# Patient Record
Sex: Female | Born: 1950 | Race: White | Hispanic: No | Marital: Married | Smoking: Never smoker
Health system: Southern US, Community
[De-identification: ages and names within clinical notes are randomized; demographics above are authoritative.]

## PROBLEM LIST (undated history)

## (undated) DIAGNOSIS — Z860100 Personal history of colon polyps, unspecified: Secondary | ICD-10-CM

## (undated) DIAGNOSIS — K219 Gastro-esophageal reflux disease without esophagitis: Secondary | ICD-10-CM

## (undated) DIAGNOSIS — R519 Headache, unspecified: Secondary | ICD-10-CM

## (undated) DIAGNOSIS — Z87442 Personal history of urinary calculi: Secondary | ICD-10-CM

## (undated) DIAGNOSIS — Z803 Family history of malignant neoplasm of breast: Secondary | ICD-10-CM

## (undated) DIAGNOSIS — S8992XA Unspecified injury of left lower leg, initial encounter: Secondary | ICD-10-CM

## (undated) DIAGNOSIS — E039 Hypothyroidism, unspecified: Secondary | ICD-10-CM

## (undated) DIAGNOSIS — C801 Malignant (primary) neoplasm, unspecified: Secondary | ICD-10-CM

## (undated) DIAGNOSIS — M8580 Other specified disorders of bone density and structure, unspecified site: Secondary | ICD-10-CM

## (undated) DIAGNOSIS — D179 Benign lipomatous neoplasm, unspecified: Secondary | ICD-10-CM

## (undated) DIAGNOSIS — J189 Pneumonia, unspecified organism: Secondary | ICD-10-CM

## (undated) DIAGNOSIS — J45909 Unspecified asthma, uncomplicated: Secondary | ICD-10-CM

## (undated) DIAGNOSIS — N6019 Diffuse cystic mastopathy of unspecified breast: Secondary | ICD-10-CM

## (undated) DIAGNOSIS — N959 Unspecified menopausal and perimenopausal disorder: Secondary | ICD-10-CM

## (undated) DIAGNOSIS — R51 Headache: Secondary | ICD-10-CM

## (undated) DIAGNOSIS — E7800 Pure hypercholesterolemia, unspecified: Secondary | ICD-10-CM

## (undated) HISTORY — PX: WISDOM TOOTH EXTRACTION: SHX21

## (undated) HISTORY — DX: Hypothyroidism, unspecified: E03.9

## (undated) HISTORY — DX: Diffuse cystic mastopathy of unspecified breast: N60.19

## (undated) HISTORY — DX: Benign lipomatous neoplasm, unspecified: D17.9

## (undated) HISTORY — DX: Unspecified injury of left lower leg, initial encounter: S89.92XA

## (undated) HISTORY — DX: Pure hypercholesterolemia, unspecified: E78.00

## (undated) HISTORY — DX: Other specified disorders of bone density and structure, unspecified site: M85.80

## (undated) HISTORY — DX: Unspecified menopausal and perimenopausal disorder: N95.9

## (undated) HISTORY — DX: Personal history of colon polyps, unspecified: Z86.0100

## (undated) HISTORY — DX: Personal history of colonic polyps: Z86.010

## (undated) HISTORY — PX: TONSILLECTOMY AND ADENOIDECTOMY: SUR1326

## (undated) HISTORY — DX: Family history of malignant neoplasm of breast: Z80.3

## (undated) HISTORY — DX: Unspecified asthma, uncomplicated: J45.909

---

## 1987-09-07 DIAGNOSIS — E039 Hypothyroidism, unspecified: Secondary | ICD-10-CM

## 1987-09-07 HISTORY — DX: Hypothyroidism, unspecified: E03.9

## 2000-09-06 HISTORY — PX: OTHER SURGICAL HISTORY: SHX169

## 2004-04-03 ENCOUNTER — Encounter: Payer: Self-pay | Admitting: Pulmonary Disease

## 2006-01-12 ENCOUNTER — Encounter: Payer: Self-pay | Admitting: Pulmonary Disease

## 2008-02-12 ENCOUNTER — Encounter: Payer: Self-pay | Admitting: Pulmonary Disease

## 2008-03-04 ENCOUNTER — Encounter: Payer: Self-pay | Admitting: Pulmonary Disease

## 2009-02-25 ENCOUNTER — Encounter: Payer: Self-pay | Admitting: Pulmonary Disease

## 2009-11-05 ENCOUNTER — Ambulatory Visit: Payer: Self-pay | Admitting: Pulmonary Disease

## 2009-11-05 DIAGNOSIS — E039 Hypothyroidism, unspecified: Secondary | ICD-10-CM | POA: Insufficient documentation

## 2009-11-05 DIAGNOSIS — S99929A Unspecified injury of unspecified foot, initial encounter: Secondary | ICD-10-CM

## 2009-11-05 DIAGNOSIS — D179 Benign lipomatous neoplasm, unspecified: Secondary | ICD-10-CM

## 2009-11-05 DIAGNOSIS — D126 Benign neoplasm of colon, unspecified: Secondary | ICD-10-CM | POA: Insufficient documentation

## 2009-11-05 DIAGNOSIS — E7800 Pure hypercholesterolemia, unspecified: Secondary | ICD-10-CM

## 2009-11-05 DIAGNOSIS — S99919A Unspecified injury of unspecified ankle, initial encounter: Secondary | ICD-10-CM

## 2009-11-05 DIAGNOSIS — N959 Unspecified menopausal and perimenopausal disorder: Secondary | ICD-10-CM | POA: Insufficient documentation

## 2009-11-05 DIAGNOSIS — J309 Allergic rhinitis, unspecified: Secondary | ICD-10-CM | POA: Insufficient documentation

## 2009-11-05 DIAGNOSIS — N6019 Diffuse cystic mastopathy of unspecified breast: Secondary | ICD-10-CM

## 2009-11-05 DIAGNOSIS — S8990XA Unspecified injury of unspecified lower leg, initial encounter: Secondary | ICD-10-CM

## 2009-11-05 LAB — CONVERTED CEMR LAB
ALT: 21 units/L (ref 0–35)
AST: 22 units/L (ref 0–37)
Alkaline Phosphatase: 71 units/L (ref 39–117)
Basophils Absolute: 0 10*3/uL (ref 0.0–0.1)
Basophils Relative: 0.8 % (ref 0.0–3.0)
Bilirubin, Direct: 0.2 mg/dL (ref 0.0–0.3)
CO2: 29 meq/L (ref 19–32)
Chloride: 107 meq/L (ref 96–112)
Eosinophils Absolute: 0.1 10*3/uL (ref 0.0–0.7)
LDL Cholesterol: 74 mg/dL (ref 0–99)
MCHC: 34.8 g/dL (ref 30.0–36.0)
MCV: 96.3 fL (ref 78.0–100.0)
Monocytes Absolute: 0.3 10*3/uL (ref 0.1–1.0)
Neutrophils Relative %: 54.3 % (ref 43.0–77.0)
Platelets: 151 10*3/uL (ref 150.0–400.0)
Potassium: 4.6 meq/L (ref 3.5–5.1)
RDW: 10.9 % — ABNORMAL LOW (ref 11.5–14.6)
TSH: 0.19 microintl units/mL — ABNORMAL LOW (ref 0.35–5.50)
Total Bilirubin: 0.5 mg/dL (ref 0.3–1.2)
Total CHOL/HDL Ratio: 2

## 2009-11-20 LAB — CONVERTED CEMR LAB: Vit D, 25-Hydroxy: 19 ng/mL — ABNORMAL LOW (ref 30–89)

## 2010-05-04 ENCOUNTER — Ambulatory Visit: Payer: Self-pay | Admitting: Pulmonary Disease

## 2010-05-04 DIAGNOSIS — M8580 Other specified disorders of bone density and structure, unspecified site: Secondary | ICD-10-CM | POA: Insufficient documentation

## 2010-05-04 DIAGNOSIS — E559 Vitamin D deficiency, unspecified: Secondary | ICD-10-CM

## 2010-10-06 NOTE — Assessment & Plan Note (Signed)
Summary: 67m reck/klw   Primary Care [facility]:  Charlynne Cousins)  CC:  6 month ROV & review of mult medical problems....  History of Present Illness: 60 y/o WF, mother of Glennie Hawk, who has moved here from [place] to be closer to her daughter & grandchildren... she enjoys good general medical health & takes Simvastatin for Cholesterol, and Synthroid for Hypothyroidism... we will send for records from her Internist in [place]>>> Records from DrEhret reviewed>>> pertinent problems added to her prob list below & data scanned into the EMR record...    ~  May 04, 2010:  27mo ROV doing satis but she has gained 10# and notes coccyx pain on long rides to [place]... we discussed proper cushioning, warm soaks & analgesics for this coccydynia... reviewed data from DrEhret & f/u BMD in 2012... prev labs reviewed & TSH was oversuppressed on the Synthroid 112mcg/d dose> we will repeat and decr dose if nec...   Current Problems:   ALLERGIC RHINITIS (ICD-477.9) - she's had allergy testing in the past- prev on shots...  she notes that she is sensitive to smoke w/ SOB & was on an inhaler in the past (diagnosed w/ reactive airways disease)... she states her breathing & allergy symptoms really improved after her cockateel died!  HYPERCHOLESTEROLEMIA (ICD-272.0) - prev on Lip20 & switched to SIMVASTATIN 40mg /d per her insurance... last labs in [place] 7/10 were good on this med + low fat diet...  ~  FLP here on Simva40 3/11 showed TChol 144, TG 37, HDL 63, LDL 74  HYPOTHYROIDISM (ICD-244.9) - she relates hx enlarged thyroid in 1989 w/ RAI therapy... subsequently started on Synthroid and doing well on 112 micrograms/d x yrs now...  ~  labs 3/11 here on Synth112 showed TSH= 0.19 (0.35-5.50)  ~  labs 8/11 on Synth112 showed TSH= 0.16 and we decided to decr her to Synthroid 133mcg/d.  COLONIC POLYPS (ICD-211.3) - she gives hx of initial colonoscopy 5/07 in [place] showing 2 tiny polyps- hyperplastic on  path, told to repeat 5-7 yrs by her [place] physicians...  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 FIBROCYSTIC BREAST DISEASE (ICD-610.1) - she takes estrogen & progesterone meds from her GYN in [place]... she will be establishing w/ GYN here in [place] soon... last mammogram was 10/10 & told to f/u 8yr... there is a pos FamHx of breast cancer in her mother...  Hx of KNEE INJURY, LEFT (ICD-959.7) - occas left knee pain due to old injury... she uses OTC meds w/ relief & wears a brace Prn...  OSTEOPENIA (ICD-733.90) - she had BMDs in [place] in 2007 & 2008> encouraged to take OSCAL Bid + MVI...  ~  BMD 3/07 showed TScores -1.8 in Spine, and -0.1 in left Hip...  ~  BMD 6/09 showed TScores -1.2 in Spine, and -0.1 in Hip  VITAMIN D DEFICIENCY (ICD-268.9) - rec to start Vit D supplement  ~2000 u daily...  ~  labs here 3/11 showed Vit D level = 19... rec> start  ~2000 u daily.  Hx of LIPOMAS, MULTIPLE (ICD-214.9) - hx mult lipomas, several removed over the yrs... and treatment for ONYCHOMYCOSIS w/ Lamisil.   Preventive Screening-Counseling & Management  Alcohol-Tobacco     Smoking Status: never  Allergies: 1)  ! Bactrim  Comments:  Nurse/Medical Assistant: The patient's medications and allergies were reviewed with the patient and were updated in the Medication and Allergy Lists.  Past History:  Past Medical History: ALLERGIC RHINITIS (ICD-477.9)  HYPERCHOLESTEROLEMIA (ICD-272.0) HYPOTHYROIDISM (ICD-244.9) COLONIC POLYPS (ICD-211.3) POSTMENOPAUSAL SYNDROME (ICD-627.9) FIBROCYSTIC BREAST DISEASE (ICD-610.1) Hx of KNEE INJURY, LEFT (ICD-959.7) OSTEOPENIA (ICD-733.90) VITAMIN D DEFICIENCY (ICD-268.9) Hx of LIPOMAS, MULTIPLE (ICD-214.9)  Past Surgical History: S/P T & A at age 27 S/P wisdom teeth removed as teen S/P benign right breast biopsy in 2002 S/P removal of mult lipomas  Family History: Reviewed history from 11/05/2009  and no changes required. Father died age 75 w/ alzheimer's dis, HBP, [place] Mother dies age 64 w/ breast cancer (bilat tumors) No siblings...  Social History: Reviewed history from 11/05/2009 and no changes required. Married, husb= [place], 40 yrs... 1 Daughter, Glennie Hawk... 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    Vital Signs:  Patient profile:   60 year old female Height:      65 inches Weight:      160.19 pounds O2 Sat:      97 % on Room air Temp:     97.3 degrees F oral Pulse rate:   60 / minute BP sitting:   120 / 76  (right arm) Cuff size:   regular  Vitals Entered By: Randell Loop CMA (May 04, 2010 2:04 PM)  O2 Sat at Rest %:  97 O2 Flow:  Room air CC: 6 month ROV & review of mult medical problems... Is Patient Diabetic? No Pain Assessment Patient in pain? no      Comments no changes in meds today   Physical Exam  Additional Exam:  WD, WN, 60 y/o WF in NAD... GENERAL:  Alert & oriented; pleasant & cooperative... HEENT:  [place]/AT, EOM-wnl, PERRLA, Fundi-benign, EACs-clear, TMs-wnl, NOSE-clear, THROAT-clear & wnl. NECK:  Supple w/ full ROM; no JVD; normal carotid impulses w/o bruits; no thyromegaly or nodules palpated; no lymphadenopathy. CHEST:  Clear to P & A; without wheezes/ rales/ or rhonchi. HEART:  Regular Rhythm; without murmurs/ rubs/ or gallops. ABDOMEN:  Soft & nontender; normal bowel  sounds; no organomegaly or masses detected. Tender on palp over coccyx... EXT: without deformities or arthritic changes; no varicose veins/ venous insuffic/ or edema. NEURO:  CN's intact; motor testing normal; sensory testing normal; gait normal & balance OK. DERM:  she has several lipomas...    MISC. Report  Procedure date:  05/04/2010  Findings:      TSH (TSH)   FastTSH              [L]  0.16 uIU/mL                 0.35-5.50   Impression & Recommendations:  Problem # 1:  COCCYDYNIA>>> We discussed warm soaks, donut cushion, and analgesics (she does not want meds)... discussed the role of surg in this condition & she will watch the discomfort & call Prn...  Problem # 2:  HYPERCHOLESTEROLEMIA (ICD-272.0) Labs looked good on the Simva40... Her updated medication list for this problem includes:    Simvastatin 40 Mg Tabs (Simvastatin) .Marland Kitchen... Take 1 by mouth once daily  Problem # 3:  HYPOTHYROIDISM (ICD-244.9) TSH oversuppressed on the 112 dose.Marland KitchenMarland Kitchen  therefore we will decr to 100 micrograms/d... Her updated medication list for this problem includes:    Synthroid 100 Mcg Tabs (Levothyroxine sodium) .Marland Kitchen... Take 1 tab by mouth once daily...  Orders: TLB-TSH (Thyroid Stimulating Hormone) (84443-TSH)  Problem # 4:  COLONIC POLYPS (ICD-211.3) Last colon was 2007 & follow up due ? 20012-14 range...  Problem # 5:  FIBROCYSTIC BREAST DISEASE (ICD-610.1) She needs GYN specialist & f/u mammogram due 10/11 (pos FamHx breast ca in mother- died in 55's)...  Problem # 6:  OSTEOPENIA (ICD-733.90) We discussed f/u BMD in 2012 & she will contine her hormones per GYN, Calcium, MVI, Vit D supplements...  Complete Medication List: 1)  Simvastatin 40 Mg Tabs (Simvastatin) .... Take 1 by mouth once daily 2)  Synthroid 100 Mcg Tabs (Levothyroxine sodium) .... Take 1 tab by mouth once daily.Marland KitchenMarland Kitchen 3)  Ortho-est 0.625 0.75 Mg Tabs (Estropipate) .... Take 1 by mouth once daily 4)  Medroxyprogesterone  Acetate 5 Mg Tabs (Medroxyprogesterone acetate) .... Take 1 by mouth once daily 5)  Oscal 500/200 D-3 500-200 Mg-unit Tabs (Calcium-vitamin d) .... Take 1 tab by mouth two times a day... 6)  Vitamin D 2000 Unit Tabs (Cholecalciferol) .... Take one tablet by mouth once daily  Patient Instructions: 1)  Today we updated your med list- see below.... 2)  Today we did your follow up TSH level to check your Thyroid function & medication... please call the "phone tree" in a few days for your lab results... if your TSH is oversuppressed we will recommend a decrease in your dose.Marland KitchenMarland Kitchen 3)  We discussed diet + exercise program... 4)  Call for any problems.Marland KitchenMarland Kitchen 5)  Please schedule a follow-up appointment in 6 months. Prescriptions: SYNTHROID 100 MCG TABS (LEVOTHYROXINE SODIUM) take 1 tab by mouth once daily...  #90 x 4   Entered and Authorized by:   Michele Mcalpine MD   Signed by:   Michele Mcalpine MD on 05/04/2010   Method used:   Print then Give to Patient   RxID:   [id number]    Immunization History:  Influenza Immunization History:    Influenza:  historical (07/31/2008)

## 2010-10-06 NOTE — Procedures (Signed)
Summary: Roxanne Mins MD  Roxanne Mins MD   Imported By: Lester Montgomery 05/12/2010 11:01:49  _____________________________________________________________________  External Attachment:    Type:   Image     Comment:   External Document

## 2010-10-06 NOTE — Assessment & Plan Note (Signed)
Summary: NEW PT TO ESTABLISH/OK PER SN/APC   Primary Care Provider:  Charlynne Cousins)  CC:  New patient to establish care....  History of Present Illness: 60 y/o WF, mother of Glennie Hawk, who has moved here from [place] to be closer to her daughter & grand daughters... she enjoys good general medical health & takes Simvastatin for Cholesterol, and Synthroid for Hypothyroidism... we will send for records from her Internist in [place]>>>   Current Problems:   ALLERGIC RHINITIS (ICD-477.9) - she's had allergy testing in the past- prev on shots...  she notes that she is sensitive to smoke w/ SOB & was on an inhaler in the past... she states her breathing & allergy symptoms really improved after her cockateel died!  HYPERCHOLESTEROLEMIA (ICD-272.0) - prev on Lip20 & switched to SIMVASTATIN 40mg /d per her insurance... last labs in [place] 7/10 were good on this med + low fat diet...  ~  FLP here on Simva40 3/11 showed TChol =   HYPOTHYROIDISM (ICD-244.9) - she relates hx enlarged thyroid in 1989 w/ RAI therapy... subsequently started on Synthroid and doing well on 112 micrograms/d x yrs now...  ~  labs 3/11 here on Synth112 showed TSH=   COLONIC POLYPS (ICD-211.3) - she gives hx of initial colonoscopy 5/07 in [place] showing 2 tiny polyps- ? path, told to repeat 5-7 yrs.  POSTMENOPAUSAL SYNDROME (ICD-627.9) FIBROCYSTIC BREAST DISEASE (ICD-610.1) - she takes estrogen & progesterone meds from her GYN in [place]... she will be establishing w/ GYN here in [place] soon... last mammogram was 10/10 & told to f/u 14yr...  Hx of KNEE INJURY, LEFT (ICD-959.7) - occas left knee pain due to old injury... she uses OTC meds w/ relief & wears a brace Prn...  Hx of LIPOMAS, MULTIPLE (ICD-214.9) - hx mult lipomas, several removed over the yrs...   Current Medications (verified): 1)  Synthroid 112 Mcg Tabs (Levothyroxine Sodium) .... Take 1 By Mouth Once Daily 2)  Simvastatin 40 Mg Tabs (Simvastatin)  .... Take 1 By Mouth Once Daily 3)  Ortho-Est 0.625 0.75 Mg Tabs (Estropipate) .... Take 1 By Mouth Once Daily 4)  Medroxyprogesterone Acetate 5 Mg Tabs (Medroxyprogesterone Acetate) .... Take 1 By Mouth Once Daily  Allergies (verified): 1)  ! Bactrim  Past History:  Past Medical History:  ALLERGIC RHINITIS (ICD-477.9) HYPERCHOLESTEROLEMIA (ICD-272.0) HYPOTHYROIDISM (ICD-244.9) COLONIC POLYPS (ICD-211.3) POSTMENOPAUSAL SYNDROME (ICD-627.9) FIBROCYSTIC BREAST DISEASE (ICD-610.1) Hx of KNEE INJURY, LEFT (ICD-959.7) Hx of LIPOMAS, MULTIPLE (ICD-214.9)  Past Surgical History: S/P T & A at age 89 S/P wisdom teeth removed as teen S/P benign right breast biopsy in 2002 S/P removal of mult lipomas  Family History: Father died age 49 w/ alzheimer's dis, HBP, [place] Mother dies age 77 w/ breast cancer (bilat tumors) No siblings...  Social History: Married, husb= [place], 40 yrs... 1 Daughter, Glennie Hawk... 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[employer]  Review of Systems  The patient denies fever, chills, sweats, anorexia, fatigue, weakness, malaise, weight loss, sleep disorder, blurring, diplopia, eye irritation, eye discharge, vision loss, eye pain, photophobia, earache, ear discharge, tinnitus, decreased hearing, nasal congestion, nosebleeds, sore throat, hoarseness, chest pain, palpitations, syncope, dyspnea on exertion, orthopnea, PND, peripheral edema, cough, dyspnea at rest, excessive sputum, hemoptysis, wheezing, pleurisy, nausea, vomiting, diarrhea, constipation, change in bowel habits, abdominal pain, melena, hematochezia, jaundice, gas/bloating, indigestion/heartburn, dysphagia, odynophagia, dysuria, hematuria, urinary frequency, urinary hesitancy, nocturia, incontinence, back pain, joint pain, joint swelling, muscle cramps, muscle weakness, stiffness, arthritis, sciatica, restless  legs, leg pain at night, leg pain with exertion, rash,  itching, dryness, suspicious lesions, paralysis, paresthesias, seizures, tremors, vertigo, transient blindness, frequent falls, frequent headaches, difficulty walking, depression, anxiety, memory loss, confusion, cold intolerance, heat intolerance, polydipsia, polyphagia, polyuria, unusual weight change, abnormal bruising, bleeding, enlarged lymph nodes, urticaria, allergic rash, hay fever, and recurrent infections.    Vital Signs:  Patient profile:   60 year old female Height:      65 inches Weight:      149.50 pounds BMI:     24.97 O2 Sat:      99 % on Room air Temp:     98 degrees F oral Pulse rate:   82 / minute BP sitting:   110 / 70  (left arm) Cuff size:   regular  Vitals Entered By: Renold Genta RCP, LPN (November 05, 1608 11:54 AM)  O2 Flow:  Room air  Physical Exam  Additional Exam:  WD, WN, 60 y/o WF in NAD... GENERAL:  Alert & oriented; pleasant & cooperative... HEENT:  [place]/AT, EOM-wnl, PERRLA, Fundi-benign, EACs-clear, TMs-wnl, NOSE-clear, THROAT-clear & wnl. NECK:  Supple w/ full ROM; no JVD; normal carotid impulses w/o bruits; no thyromegaly or nodules palpated; no lymphadenopathy. CHEST:  Clear to P & A; without wheezes/ rales/ or rhonchi. HEART:  Regular Rhythm; without murmurs/ rubs/ or gallops. ABDOMEN:  Soft & nontender; normal bowel sounds; no organomegaly or masses detected. EXT: without deformities or arthritic changes; no varicose veins/ venous insuffic/ or edema. NEURO:  CN's intact; motor testing normal; sensory testing normal; gait normal & balance OK. DERM:  she has several lipomas...    MISC. Report  Procedure date:  11/05/2009  Findings:      Lipid Panel (LIPID)   Cholesterol               144 mg/dL                   9-604   Triglycerides             37.0 mg/dL                  5.4-098.1   HDL                       19.14 mg/dL                 >78.29   LDL Cholesterol           74 mg/dL                    5-62  BMP (METABOL)   Sodium                     142 mEq/L                   135-145   Potassium                 4.6 mEq/L                   3.5-5.1   Chloride                  107 mEq/L                   96-112   Carbon Dioxide            29 mEq/L  19-32   Glucose                   96 mg/dL                    16-10   BUN                       13 mg/dL                    9-60   Creatinine                1.0 mg/dL                   4.5-4.0   Calcium                   9.4 mg/dL                   9.8-11.9   GFR                       60.58 mL/min                >60  Hepatic/Liver Function Panel (HEPATIC)   Total Bilirubin           0.5 mg/dL                   1.4-7.8   Direct Bilirubin          0.2 mg/dL                   2.9-5.6   Alkaline Phosphatase      71 U/L                      39-117   AST                       22 U/L                      0-37   ALT                       21 U/L                      0-35   Total Protein             6.9 g/dL                    2.1-3.0   Albumin                   4.1 g/dL                    8.6-5.7  Comments:      CBC Platelet w/Diff (CBCD)   White Cell Count          4.6 K/uL                    4.5-10.5   Red Cell Count       [L]  3.84 Mil/uL                 3.87-5.11   Hemoglobin                12.8 g/dL  12.0-15.0   Hematocrit                36.9 %                      36.0-46.0   MCV                       96.3 fl                     78.0-100.0   Platelet Count            151.0 K/uL                  150.0-400.0   Neutrophil %              54.3 %                      43.0-77.0   Lymphocyte %              35.7 %                      12.0-46.0   Monocyte %                7.6 %                       3.0-12.0   Eosinophils%              1.6 %                       0.0-5.0   Basophils %               0.8 %                       0.0-3.0   TSH (TSH)   FastTSH              [L]  0.19 uIU/mL                 0.35-5.50   Impression & Recommendations:  Problem  # 1:  NEW PATIENT>>> We are sending request for old records to her Internist in [place]...  Problem # 2:  ALLERGIC RHINITIS (ICD-477.9) We discussed Claritin or similar in AM, Saline spray every 1-2 H, and consider nasal steroid Qhs (she will call if she needs this)...  Problem # 3:  HYPERCHOLESTEROLEMIA (ICD-272.0) Check FLP on Simva40-  her numbers look great!  continue the same. Her updated medication list for this problem includes:    Simvastatin 40 Mg Tabs (Simvastatin) .Marland Kitchen... Take 1 by mouth once daily  Orders: Prescription Created Electronically [id number]) TLB-Lipid Panel (80061-LIPID) TLB-BMP (Basic Metabolic Panel-BMET) (80048-METABOL) TLB-Hepatic/Liver Function Pnl (80076-HEPATIC) TLB-CBC Platelet - w/Differential (85025-CBCD) TLB-TSH (Thyroid Stimulating Hormone) (84443-TSH) T-Vitamin D (25-Hydroxy) (95284-13244)  Problem # 4:  COLONIC POLYPS (ICD-211.3) She notes hx 2 sm polyps on her initial colon 2007 & told to f/u in 5-45yrs...  Problem # 5:  POSTMENOPAUSAL SYNDROME (ICD-627.9) She will establish w/ a local GYN for refill of these meds & other options... Her updated medication list for this problem includes:    Ortho-est 0.625 0.75 Mg Tabs (Estropipate) .Marland Kitchen... Take 1 by mouth once daily  Problem # 6:  FIBROCYSTIC BREAST DISEASE (ICD-610.1) F/u mammogram due 10/11 she says...  Problem # 7:  HYPOTHYROIDISM (ICD-244.9) TSH is sl oversuppressed on this dose but she feels well, clinically euthyroid, and she notes that she felt sluggish w/ hair symptoms on the 100 micrograms dose in the past...  OK to continue 112. Her updated medication list for this problem includes:    Synthroid 112 Mcg Tabs (Levothyroxine sodium) .Marland Kitchen... Take 1 by mouth once daily  Problem # 8:  OTHER MEDICAL PROBLEMS AS NOTED>>>  Complete Medication List: 1)  Simvastatin 40 Mg Tabs (Simvastatin) .... Take 1 by mouth once daily 2)  Synthroid 112 Mcg Tabs (Levothyroxine sodium) .... Take 1 by mouth once  daily 3)  Ortho-est 0.625 0.75 Mg Tabs (Estropipate) .... Take 1 by mouth once daily 4)  Medroxyprogesterone Acetate 5 Mg Tabs (Medroxyprogesterone acetate) .... Take 1 by mouth once daily  Patient Instructions: 1)  Ludia, it was great meeting you.Marland KitchenMarland Kitchen  2)  We wrote new prscriptions for your Simvastatin & Synthroid.Marland KitchenMarland Kitchen  3)  Today we did your FASTING blood work... please call the "phone tree" in a few days for your lab results.Marland KitchenMarland Kitchen 4)  I recommend that you take an 81mg  Aspirin daily, a calcium supplement (eg- OsCal, Caltrate, Viactive), and a Women's Multivit (they have extra Vit D).Marland KitchenMarland Kitchen 5)  Call for any problems.Marland KitchenMarland Kitchen 6)  Please schedule a follow-up appointment in 6 months, sooner as needed.. Prescriptions: SYNTHROID 112 MCG TABS (LEVOTHYROXINE SODIUM) take 1 by mouth once daily  #30 x prn   Entered and Authorized by:   Michele Mcalpine MD   Signed by:   Michele Mcalpine MD on 11/05/2009   Method used:   Print then Give to Patient   RxID:   [id number] SIMVASTATIN 40 MG TABS (SIMVASTATIN) take 1 by mouth once daily  #30 x prn   Entered and Authorized by:   Michele Mcalpine MD   Signed by:   Michele Mcalpine MD on 11/05/2009   Method used:   Print then Give to Patient   RxID:   [id number]

## 2010-11-04 ENCOUNTER — Other Ambulatory Visit: Payer: PRIVATE HEALTH INSURANCE

## 2010-11-04 ENCOUNTER — Ambulatory Visit (INDEPENDENT_AMBULATORY_CARE_PROVIDER_SITE_OTHER): Payer: PRIVATE HEALTH INSURANCE | Admitting: Pulmonary Disease

## 2010-11-04 ENCOUNTER — Ambulatory Visit (INDEPENDENT_AMBULATORY_CARE_PROVIDER_SITE_OTHER)
Admission: RE | Admit: 2010-11-04 | Discharge: 2010-11-04 | Disposition: A | Discharge status: Home / Self Care | Payer: PRIVATE HEALTH INSURANCE | Source: Ambulatory Visit | Attending: Pulmonary Disease | Admitting: Pulmonary Disease

## 2010-11-04 ENCOUNTER — Encounter: Payer: Self-pay | Admitting: Pulmonary Disease

## 2010-11-04 ENCOUNTER — Other Ambulatory Visit: Payer: Self-pay | Admitting: Pulmonary Disease

## 2010-11-04 DIAGNOSIS — Z0000 Encounter for general adult medical examination without abnormal findings: Secondary | ICD-10-CM

## 2010-11-04 DIAGNOSIS — Z23 Encounter for immunization: Secondary | ICD-10-CM

## 2010-11-04 LAB — CBC WITH DIFFERENTIAL/PLATELET
Basophils Absolute: 0 10*3/uL (ref 0.0–0.1)
Hemoglobin: 13.3 g/dL (ref 12.0–15.0)
Lymphocytes Relative: 36.1 % (ref 12.0–46.0)
Monocytes Relative: 8.3 % (ref 3.0–12.0)
Neutro Abs: 2.5 10*3/uL (ref 1.4–7.7)
RBC: 3.99 Mil/uL (ref 3.87–5.11)
RDW: 12.3 % (ref 11.5–14.6)

## 2010-11-04 LAB — URINALYSIS, ROUTINE W REFLEX MICROSCOPIC
Bilirubin Urine: NEGATIVE
Ketones, ur: NEGATIVE
Leukocytes, UA: NEGATIVE
Total Protein, Urine: NEGATIVE
Urine Glucose: NEGATIVE
pH: 5.5 (ref 5.0–8.0)

## 2010-11-04 LAB — HEPATIC FUNCTION PANEL
Albumin: 4.3 g/dL (ref 3.5–5.2)
Alkaline Phosphatase: 61 U/L (ref 39–117)
Total Protein: 6.9 g/dL (ref 6.0–8.3)

## 2010-11-04 LAB — BASIC METABOLIC PANEL
BUN: 12 mg/dL (ref 6–23)
CO2: 28 mEq/L (ref 19–32)
Calcium: 9.6 mg/dL (ref 8.4–10.5)
Creatinine, Ser: 1 mg/dL (ref 0.4–1.2)
Glucose, Bld: 89 mg/dL (ref 70–99)

## 2010-11-04 LAB — TSH: TSH: 0.34 u[IU]/mL — ABNORMAL LOW (ref 0.35–5.50)

## 2010-11-04 LAB — LIPID PANEL
HDL: 45.5 mg/dL (ref >39.00)
VLDL: 8.6 mg/dL (ref 0.0–40.0)

## 2010-11-08 LAB — CONVERTED CEMR LAB: Vit D, 25-Hydroxy: 33 ng/mL (ref 30–89)

## 2010-11-17 NOTE — Assessment & Plan Note (Signed)
Summary: 6 month/apc   Primary Care Provider:  Charlynne Cousins)  CC:  6 month ROV & yearly CPX....  History of Present Illness: 60 y/o WF, mother of Victoria Pacheco, who has moved here from [place] to be closer to her daughter & grandchildren... she enjoys good general medical health & takes Simvastatin for Cholesterol, and Synthroid for Hypothyroidism...  records from her Internist in [place] DrEhret reviewed>>> pertinent problems added to her prob list below & data scanned into the EMR record...    ~  May 04, 2010:  72mo ROV doing satis but she has gained 10# and notes coccyx pain on long rides to [place]... we discussed proper cushioning, warm soaks & analgesics for this coccydynia... reviewed data from DrEhret & f/u BMD due in 2012... prev labs reviewed & TSH was oversuppressed on the Synthroid 13mcg/d dose> we will repeat (TSH=0.16 & dose decr to 140mcg/d).   ~  November 04, 2010:  72mo ROV & CPX- doing well overall, notes some constip on calcium supplements & we discussed strategies to deal w/ this, also notes some heel pain ?plantar fasciitis & we reviewed stretching exercises, hot soaks, heel pad...    She's been on Simva40 per insurance but she notes some aching 7 wants to switch back to the Lip20 now that generic is avail;  FLP on Simva40 looks good> TChol 134, TG 43, HDL 46, LDL 80;  we will write for the Lipitor 20mg  per request...    She's stable on the Synthroid dose w/ TSH= 0.34 & her wt stable at 161#... Vit D level is improved to 33 from 19 on the 2000 u daily & she will continue same... we will sched a follow up BMD.   Current Problems:   ALLERGIC RHINITIS (ICD-477.9) - she's had allergy testing in the past- prev on shots...  she notes that she is sensitive to smoke w/ SOB & was on an inhaler in the past (diagnosed w/ reactive airways disease)... she states her breathing & allergy symptoms really improved after her cockateel died!  HYPERCHOLESTEROLEMIA (ICD-272.0)  - prev on Lip20 & switched to SIMVASTATIN 40mg /d per her insurance... last labs in [place] 7/10 were good on this med + low fat diet...  ~  FLP here on Simva40 3/11 showed TChol 144, TG 37, HDL 63, LDL 74  HYPOTHYROIDISM (ICD-244.9) - she relates hx enlarged thyroid in 1989 w/ RAI therapy... subsequently started on Synthroid and doing well on 112 micrograms/d x yrs now...  ~  labs 3/11 here on Synth112 showed TSH= 0.19 (0.35-5.50)  ~  labs 8/11 on Synth112 showed TSH= 0.16 and we decided to decr her to Synthroid 154mcg/d.  COLONIC POLYPS (ICD-211.3) - she gives hx of initial colonoscopy 5/07 in [place] showing 2 tiny polyps- hyperplastic on path, told to repeat 5-7 yrs by her [place] physicians...  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 FIBROCYSTIC BREAST DISEASE (ICD-610.1) - she takes estrogen & progesterone meds from her GYN in [place]... she will be establishing w/ GYN here in [place] soon... last mammogram was 10/10 & told to f/u 20yr... there is a pos FamHx of breast cancer in her mother...  Hx of KNEE INJURY, LEFT (ICD-959.7) - occas left knee pain due to old injury... she uses OTC meds w/ relief & wears a brace Prn...  OSTEOPENIA (ICD-733.90) - she had BMDs in [place] in 2007 & 2008> encouraged to take OSCAL Bid + MVI...  ~  BMD  3/07 showed TScores -1.8 in Spine, and -0.1 in left Hip...  ~  BMD 6/09 showed TScores -1.2 in Spine, and -0.1 in Hip  VITAMIN D DEFICIENCY (ICD-268.9) - rec to start Vit D supplement  ~2000 u daily...  ~  labs here 3/11 showed Vit D level = 19... rec> start  ~2000 u daily.  Hx of LIPOMAS, MULTIPLE (ICD-214.9) - hx mult lipomas, several removed over the yrs... and treatment for ONYCHOMYCOSIS w/ Lamisil.   Preventive Screening-Counseling & Management  Alcohol-Tobacco     Smoking Status: never  Allergies: 1)  ! Bactrim  Comments:  Nurse/Medical Assistant: The patient's medications and allergies were  reviewed with the patient and were updated in the Medication and Allergy Lists.  Past History:  Past Medical History: ALLERGIC RHINITIS (ICD-477.9) HYPERCHOLESTEROLEMIA (ICD-272.0) HYPOTHYROIDISM (ICD-244.9) COLONIC POLYPS (ICD-211.3) POSTMENOPAUSAL SYNDROME (ICD-627.9) FIBROCYSTIC BREAST DISEASE (ICD-610.1) Hx of KNEE INJURY, LEFT (ICD-959.7) OSTEOPENIA (ICD-733.90) VITAMIN D DEFICIENCY (ICD-268.9) Hx of LIPOMAS, MULTIPLE (ICD-214.9)  Past Surgical History: S/P T & A at age 38 S/P wisdom teeth removed as teen S/P benign right breast biopsy in 2002 S/P removal of mult lipomas  Family History: Reviewed history from 05/04/2010 and no changes required. Father died age 61 w/ alzheimer's dis, HBP, [place] Mother dies age 85 w/ breast cancer (bilat tumors) No siblings...  Social History: Reviewed history from 05/04/2010 and no changes required. Married, husb= [place], 40 yrs... 1 Daughter, Victoria Pacheco... 2 grand daughters age 59-5 Never smoked Social alcohol Compliance officer for [employer]  Review of Systems      See HPI  The patient denies anorexia, fever, weight loss, weight gain, vision loss, decreased hearing, hoarseness, chest pain, syncope, dyspnea on exertion, peripheral edema, prolonged cough, headaches, hemoptysis, abdominal pain, melena, hematochezia, severe indigestion/heartburn, hematuria, incontinence, muscle weakness, suspicious skin lesions, transient blindness, difficulty walking, depression, unusual weight change, abnormal bleeding, enlarged lymph nodes, and angioedema.    Vital Signs:  Patient profile:   60 year old female Height:      65 inches Weight:      160.31 pounds BMI:     26.77 O2 Sat:      100 % on Room air Temp:     98.2 degrees F oral Pulse rate:   80 / minute BP sitting:   106 / 76  (right arm) Cuff size:   regular  Vitals Entered By: Randell Loop CMA (November 04, 2010 9:38 AM)  O2 Sat at Rest %:  100 O2 Flow:  Room air CC: 6 month ROV &  yearly CPX... Is Patient Diabetic? No Pain Assessment Patient in pain? yes      Onset of pain  right heel pain/painful when she walks Comments meds updated today with pt   Physical Exam  Additional Exam:  WD, WN, 60 y/o WF in NAD... GENERAL:  Alert & oriented; pleasant & cooperative... HEENT:  Felt/AT, EOM-wnl, PERRLA, Fundi-benign, EACs-clear, TMs-wnl, NOSE-clear, THROAT-clear & wnl. NECK:  Supple w/ full ROM; no JVD; normal carotid impulses w/o bruits; no thyromegaly or nodules palpated; no lymphadenopathy. CHEST:  Clear to P & A; without wheezes/ rales/ or rhonchi. HEART:  Regular Rhythm; without murmurs/ rubs/ or gallops. ABDOMEN:  Soft & nontender; normal bowel sounds; no organomegaly or masses detected. Tender on palp over coccyx... EXT: without deformities or arthritic changes; no varicose veins/ venous insuffic/ or edema. NEURO:  CN's intact; motor testing normal; sensory testing normal; gait normal & balance OK. DERM:  she has several lipomas.Marland KitchenMarland Kitchen  Impression & Recommendations:  Problem # 1:  PHYSICAL EXAMINATION (ICD-V70.0)  Orders: 12 Lead EKG (12 Lead EKG) T-2 View CXR (71020TC) T-Vitamin D (25-Hydroxy) (30160-10932) TLB-BMP (Basic Metabolic Panel-BMET) (80048-METABOL) TLB-Hepatic/Liver Function Pnl (80076-HEPATIC) TLB-CBC Platelet - w/Differential (85025-CBCD) TLB-Lipid Panel (80061-LIPID) TLB-TSH (Thyroid Stimulating Hormone) (84443-TSH) TLB-Udip w/ Micro (81001-URINE)  Problem # 2:  HYPERCHOLESTEROLEMIA (ICD-272.0) FLP looks good on Simva40, but we will switch back to Lip20 per her request (?aching)... Her updated medication list for this problem includes:    Lipitor 20 Mg Tabs (Atorvastatin calcium) .Marland Kitchen... Take 1 tab by mouth once daily...  Problem # 3:  HYPOTHYROIDISM (ICD-244.9) TSH is OK on the Levothy100... Her updated medication list for this problem includes:    Synthroid 100 Mcg Tabs (Levothyroxine sodium) .Marland Kitchen... Take 1 tab by mouth once  daily...  Problem # 4:  COLONIC POLYPS (ICD-211.3) She will be due for a follow up colonoscopy soon> last one in [place] 5/07- 2 tiny hyperpl polyps & told to recheck 5-13yrs...  Problem # 5:  OSTEOPENIA (ICD-733.90) we discussed her issues w/ calcium supplements & she will adjust etc... due for BMD & we will sched... continue 2000 u vit D daily,....  Complete Medication List: 1)  Lipitor 20 Mg Tabs (Atorvastatin calcium) .... Take 1 tab by mouth once daily.Marland KitchenMarland Kitchen 2)  Synthroid 100 Mcg Tabs (Levothyroxine sodium) .... Take 1 tab by mouth once daily.Marland KitchenMarland Kitchen 3)  Ortho-est 0.625 0.75 Mg Tabs (Estropipate) .... Take 1 by mouth once daily 4)  Medroxyprogesterone Acetate 2.5 Mg Tabs (Medroxyprogesterone acetate) .... Take one tablet by mouth once daily 5)  Oscal 500/200 D-3 500-200 Mg-unit Tabs (Calcium-vitamin d) .... Take 1 tab by mouth two times a day... 6)  Vitamin D 2000 Unit Tabs (Cholecalciferol) .... Take one tablet by mouth once daily 7)  Shingles Vaccine  .... Administer shingles vaccine pleade...  Other Orders: Tdap => 75yrs IM (35573) Admin 1st Vaccine (22025)  Patient Instructions: 1)  Today we updated your med list- see below.... 2)  We changed your Simvastatin to Lipitor 20mg /d & we refilled your meds for 2012... 3)  We will sched a Bone Density test at your convenience and we will call you w/ the results when avail.Marland KitchenMarland Kitchen 4)  Today we did your follow up CXR, EKG, & fasting blood work... please call the "phone tree" in a few days for your lab results.Marland KitchenMarland Kitchen 5)  Try different brands of CALCIUM (including the choc VIACTIVE) to see if they will not contrib to constipation.Marland KitchenMarland Kitchen 6)  You may aslo try the Metamucil/ fiberall/ Miralax OTC meds for your bowels... 7)  We gave you the combination Tetanus vaccine today called the TDAP (it is good for 65yrs)... 8)  We wrote a perscription for the SHINGLES vaccine which you can get at the [facility]/ [facility] shot clinics at your convenience... 9)  Call for any  questions... 10)  Please schedule a follow-up appointment in 1 year, sooner as needed... Prescriptions: SHINGLES VACCINE Administer Shingles vaccine pleade...  #1 x 0   Entered and Authorized by:   Michele Mcalpine MD   Signed by:   Michele Mcalpine MD on 11/04/2010   Method used:   Print then Give to Patient   RxID:   [id number] SYNTHROID 100 MCG TABS (LEVOTHYROXINE SODIUM) take 1 tab by mouth once daily...  #90 x 4   Entered and Authorized by:   Michele Mcalpine MD   Signed by:   Michele Mcalpine MD on 11/04/2010   Method  used:   Print then Give to Patient   RxID:   [id number] LIPITOR 20 MG TABS (ATORVASTATIN CALCIUM) take 1 tab by mouth once daily...  #90 x 4   Entered and Authorized by:   Michele Mcalpine MD   Signed by:   Michele Mcalpine MD on 11/04/2010   Method used:   Print then Give to Patient   RxID:   [id number]    Immunization History:  Influenza Immunization History:    Influenza:  historical (06/06/2010)  Immunizations Administered:  Tetanus Vaccine:    Vaccine Type: Tdap    Site: left deltoid    Mfr: GlaxoSmithKline    Dose: 0.5 ml    Route: IM    Given by: Randell Loop CMA    Exp. Date: 06/25/2012    Lot #: [id number]    VIS given: 07/24/08 version given November 04, 2010.

## 2011-05-12 ENCOUNTER — Other Ambulatory Visit: Payer: Self-pay | Admitting: Pulmonary Disease

## 2011-09-09 ENCOUNTER — Telehealth: Payer: Self-pay | Admitting: Pulmonary Disease

## 2011-09-09 DIAGNOSIS — M79671 Pain in right foot: Secondary | ICD-10-CM

## 2011-09-09 NOTE — Telephone Encounter (Signed)
Per SN---ok for referral for pt to see podiatry---in [place] if one is avaliable.  Called and spoke with pt and she is aware that we will try and set this up for her in [place] and we will call her with appt.

## 2011-09-09 NOTE — Telephone Encounter (Signed)
Spoke with pt. She is c/o pain on the top of her right foot x 2 wks. She states that her foot is not swollen or red, and denies any obvious injury. She states that she is fine sitting but foot feels stiff and painful when she tries to walk. Would like to know if SN wants to refer her to a specialist for this. Please advise, thanks!

## 2011-10-10 ENCOUNTER — Other Ambulatory Visit: Payer: Self-pay | Admitting: Pulmonary Disease

## 2011-11-05 ENCOUNTER — Other Ambulatory Visit (INDEPENDENT_AMBULATORY_CARE_PROVIDER_SITE_OTHER): Payer: PRIVATE HEALTH INSURANCE

## 2011-11-05 ENCOUNTER — Ambulatory Visit (INDEPENDENT_AMBULATORY_CARE_PROVIDER_SITE_OTHER): Payer: PRIVATE HEALTH INSURANCE | Admitting: Pulmonary Disease

## 2011-11-05 ENCOUNTER — Ambulatory Visit (INDEPENDENT_AMBULATORY_CARE_PROVIDER_SITE_OTHER)
Admission: RE | Admit: 2011-11-05 | Discharge: 2011-11-05 | Disposition: A | Discharge status: Home / Self Care | Payer: PRIVATE HEALTH INSURANCE | Source: Ambulatory Visit

## 2011-11-05 ENCOUNTER — Encounter: Payer: Self-pay | Admitting: Pulmonary Disease

## 2011-11-05 VITALS — BP 98/68 | HR 65 | Temp 98.4°F | Ht 64.0 in | Wt 173.0 lb

## 2011-11-05 DIAGNOSIS — Z0000 Encounter for general adult medical examination without abnormal findings: Secondary | ICD-10-CM

## 2011-11-05 DIAGNOSIS — M949 Disorder of cartilage, unspecified: Secondary | ICD-10-CM

## 2011-11-05 DIAGNOSIS — J309 Allergic rhinitis, unspecified: Secondary | ICD-10-CM

## 2011-11-05 DIAGNOSIS — E039 Hypothyroidism, unspecified: Secondary | ICD-10-CM

## 2011-11-05 DIAGNOSIS — E559 Vitamin D deficiency, unspecified: Secondary | ICD-10-CM

## 2011-11-05 DIAGNOSIS — E7800 Pure hypercholesterolemia, unspecified: Secondary | ICD-10-CM

## 2011-11-05 DIAGNOSIS — M899 Disorder of bone, unspecified: Secondary | ICD-10-CM

## 2011-11-05 DIAGNOSIS — D126 Benign neoplasm of colon, unspecified: Secondary | ICD-10-CM

## 2011-11-05 LAB — URINALYSIS
Bilirubin Urine: NEGATIVE
Hgb urine dipstick: NEGATIVE
Leukocytes, UA: NEGATIVE
Nitrite: NEGATIVE
Total Protein, Urine: NEGATIVE

## 2011-11-05 LAB — LIPID PANEL
Cholesterol: 143 mg/dL (ref 0–200)
Triglycerides: 48 mg/dL (ref 0.0–149.0)

## 2011-11-05 LAB — BASIC METABOLIC PANEL
BUN: 14 mg/dL (ref 6–23)
CO2: 29 mEq/L (ref 19–32)
Chloride: 108 mEq/L (ref 96–112)
Creatinine, Ser: 1 mg/dL (ref 0.4–1.2)

## 2011-11-05 LAB — CBC WITH DIFFERENTIAL/PLATELET
Basophils Relative: 0.6 % (ref 0.0–3.0)
Eosinophils Absolute: 0.1 10*3/uL (ref 0.0–0.7)
Eosinophils Relative: 2.2 % (ref 0.0–5.0)
Lymphocytes Relative: 34.1 % (ref 12.0–46.0)
MCHC: 33.3 g/dL (ref 30.0–36.0)
Neutrophils Relative %: 54.2 % (ref 43.0–77.0)
Platelets: 157 10*3/uL (ref 150.0–400.0)
RBC: 4.14 Mil/uL (ref 3.87–5.11)
WBC: 6 10*3/uL (ref 4.5–10.5)

## 2011-11-05 LAB — TSH: TSH: 0.47 u[IU]/mL (ref 0.35–5.50)

## 2011-11-05 LAB — HEPATIC FUNCTION PANEL
ALT: 13 U/L (ref 0–35)
AST: 16 U/L (ref 0–37)
Total Protein: 7.1 g/dL (ref 6.0–8.3)

## 2011-11-05 NOTE — Progress Notes (Signed)
Subjective:     Patient ID: Victoria Pacheco, female   DOB: Aug 02, 1951, 61 y.o.   MRN: [id number]  HPI 76 y/o WF, mother of Glennie Hawk, who has moved here from [place] to be closer to her daughter & grandchildren... she enjoys good general medical health & takes Simvastatin for Cholesterol, and Synthroid for Hypothyroidism...  records from her Internist in [place], DrEhret- reviewed>>> pertinent problems added to her prob list below & data scanned into the EMR record...   ~  May 04, 2010:  21mo ROV doing satis but she has gained 10# and notes coccyx pain on long rides to [place]... we discussed proper cushioning, warm soaks & analgesics for this coccydynia... reviewed data from DrEhret & f/u BMD due in 2012... prev labs reviewed & TSH was oversuppressed on the Synthroid 12mcg/d dose> we will repeat (TSH=0.16 & dose decr to 173mcg/d).  ~  November 04, 2010:  21mo ROV & CPX- doing well overall, notes some constip on calcium supplements & we discussed strategies to deal w/ this, also notes some heel pain ?plantar fasciitis & we reviewed stretching exercises, hot soaks, heel pad...    She's been on Simva40 per insurance but she notes some aching & wants to switch back to the Lip20 now that generic is avail;  FLP on Simva40 looks good> TChol 134, TG 43, HDL 46, LDL 80;  we will write for the Lipitor 20mg  per request...    She's stable on the Synthroid dose w/ TSH= 0.34 & her wt stable at 161#... Vit D level is improved to 33 from 19 on the 2000 u daily & she will continue same... we will sched a follow up BMD==> done 3/13 & looks good w/ mild osteopenia only (TScore -1.2 in left [facility], other sites normal)...  ~  November 05, 2011:  Yearly ROV & CPX>  Adaleena has had a good yr overall & is w/o new complaints or concerns;  She still notes some difficulty w/ Calcium supplements & asked to switch to [facility] & see if this is more palatable; being treated for tendonitis in right foot by Podiatrist & improved...  AR> states she had an allergic reaction to a dog at a pet store & she would like to know more about her allergy profile; we decided to perform RAST testing (see results below); rec to keep dog out of bedroom w/ air cleaner etc & use antihist as needed...    CHOL> On Lipitor20 now; FLP showed TChol 143, TG 48, HDL 51, LDL 82; REC> continue same...    Hypothy> On Synthroid100; TSH= 0.47 & she is clinically euthyroid; rec to continue same...    GI> hx hyperplastic polyps in [place] 5/07 w/ rec for f/u colonoscopy 36yrs- due 5/14...    GYN> she is seeing Dr. Arnette Schaumann in [place]; on estrogen & provera RX; note pos FamHx breast ca in her mother...    Osteopenia> on Calcium, MVI, VitD; BMD here 3/13 showed TScores -0.7 in Spine, and -1.2 in left FemNeck...    Onychomycosis> Podiatrist has her on Lamisil for nail fungus... Given TDAP 3/13 & Shingles Rx written... LABS 3/13:  FLP- at goals;  Chems- wnl;  CBC- wnl;  TSH=0.47;  UA- clear...   Problem List:    ALLERGIC RHINITIS (ICD-477.9) - she's had allergy testing in the past- prev on shots...  she notes that she is sensitive to smoke w/ SOB & was on an inhaler in the past (diagnosed w/ reactive airways disease)... she  states her breathing & allergy symptoms really improved after her cockateel died! ~  12/05/22: She noted allergic reaction on exposure to a dog in a pet store; we decided to proceed w/ RAST Panel:  IgE=20;  Molds were neg;  Borderline levels to Cat/Dog;  Borderline levels to Grasses/Trees;  Low level to Ragweed (this was her worst allergen)...  HYPERCHOLESTEROLEMIA (ICD-272.0) - prev on Lip20 & switched to SIMVASTATIN 40mg /d per her insurance... last labs in [place] 7/10 were good on this med + low fat diet... ~  FLP here on Simva40 3/11 showed TChol 144, TG 37, HDL 63, LDL 74  HYPOTHYROIDISM (ICD-244.9) - she relates hx enlarged thyroid in 1989 w/ RAI therapy... subsequently started on Synthroid and doing well on 112 micrograms/d x yrs now... ~  labs  3/11 here on Synth112 showed TSH= 0.19 (0.35-5.50) ~  labs 8/11 on Synth112 showed TSH= 0.16 and we decided to decr her to Synthroid 128mcg/d.  COLONIC POLYPS (ICD-211.3) - she gives hx of initial colonoscopy 5/07 in [place] showing 2 tiny polyps- hyperplastic on path, told to repeat 5-7 yrs by her [place] physicians...  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 FIBROCYSTIC BREAST DISEASE (ICD-610.1) - she takes estrogen & progesterone meds from her GYN in [place]... she will be establishing w/ GYN here in [place] soon... last mammogram was 10/10 & told to f/u 71yr... there is a pos FamHx of breast cancer in her mother...  Hx of KNEE INJURY, LEFT (ICD-959.7) - occas left knee pain due to old injury... she uses OTC meds w/ relief & wears a brace Prn...  OSTEOPENIA (ICD-733.90) - she had BMDs in [place] in 2007 & 2008> encouraged to take OSCAL Bid + MVI... ~  BMD 3/07 showed TScores -1.8 in Spine, and -0.1 in left Hip... ~  BMD 6/09 showed TScores -1.2 in Spine, and -0.1 in Hip  VITAMIN D DEFICIENCY (ICD-268.9) - rec to start Vit D supplement ~2000 u daily... ~  labs here 3/11 showed Vit D level = 19... rec> start ~2000 u daily.  Hx of LIPOMAS, MULTIPLE (ICD-214.9) - hx mult lipomas, several removed over the yrs... and treatment for ONYCHOMYCOSIS w/ Lamisil.   Past Surgical History  Procedure Date  . Tonsillectomy and adenoidectomy     at age 80  . Wisdom tooth extraction     as a teen  . Benign right breast biopsy 2002    Outpatient Encounter Prescriptions as of 11/05/2011  Medication Sig Dispense Refill  . estropipate (OGEN) 0.75 MG tablet Take 0.75 mg by mouth daily.      Marland Kitchen levothyroxine (SYNTHROID, LEVOTHROID) 100 MCG tablet TAKE 1 TAB BY MOUTH ONCE DAILY...  90 tablet  1  . medroxyPROGESTERone (PROVERA) 2.5 MG tablet Take 2.5 mg by mouth daily.      . meloxicam (MOBIC) 15 MG tablet Take 15 mg by mouth daily.      . simvastatin  (ZOCOR) 40 MG tablet TAKE 1 TABLET BY MOUTH EVERY DAY  30 tablet  8  . terbinafine (LAMISIL) 250 MG tablet Take 250 mg by mouth daily. Has not started taking yet      . DISCONTD: calcium-vitamin D (OSCAL WITH D) 500-200 MG-UNIT per tablet Take 1 tablet by mouth 2 (two) times daily.      Marland Kitchen DISCONTD: Cholecalciferol (VITAMIN D) 2000 UNITS CAPS Take 1 capsule by mouth daily.        Allergies  Allergen Reactions  .  Sulfamethoxazole W/Trimethoprim     REACTION: hives    Current Medications, Allergies, Past Medical History, Past Surgical History, Family History, and Social History were reviewed in [employer] record.    Review of Systems         See HPI - all other systems neg except as noted...  The patient denies anorexia, fever, weight loss, weight gain, vision loss, decreased hearing, hoarseness, chest pain, syncope, dyspnea on exertion, peripheral edema, prolonged cough, headaches, hemoptysis, abdominal pain, melena, hematochezia, severe indigestion/heartburn, hematuria, incontinence, muscle weakness, suspicious skin lesions, transient blindness, difficulty walking, depression, unusual weight change, abnormal bleeding, enlarged lymph nodes, and angioedema.     Objective:   Physical Exam     WD, WN, 61 y/o WF in NAD... GENERAL:  Alert & oriented; pleasant & cooperative... HEENT:  [place]/AT, EOM-wnl, PERRLA, Fundi-benign, EACs-clear, TMs-wnl, NOSE-clear, THROAT-clear & wnl. NECK:  Supple w/ full ROM; no JVD; normal carotid impulses w/o bruits; no thyromegaly or nodules palpated; no lymphadenopathy. CHEST:  Clear to P & A; without wheezes/ rales/ or rhonchi. HEART:  Regular Rhythm; without murmurs/ rubs/ or gallops. ABDOMEN:  Soft & nontender; normal bowel sounds; no organomegaly or masses detected. Tender on palp over coccyx... EXT: without deformities or arthritic changes; no varicose veins/ venous insuffic/ or edema. NEURO:  CN's intact; motor testing normal;  sensory testing normal; gait normal & balance OK. DERM:  she has several lipomas...  RADIOLOGY DATA:  Reviewed in the EPIC EMR & discussed w/ the patient...    >>CXR 2/12 showed normal heart size, clear lungs, NAD...    >>EKG 2/12 showed NSR, rate70, NSSTTWA, NAD...  LABORATORY DATA:  Reviewed in the EPIC EMR & discussed w/ the patient...    >>Labs 2/12 showed:  FLP- great on Simva40;  Chems- wnl;  CBC- wnl;  TSH=0.34 on Synthroid100    >>LABS 3/13:  FLP- at goals on Lip20;  Chems- wnl;  CBC- wnl;  TSH=0.47;  UA- clear...   Assessment:     AR>  She has mild AR and RAST panel done at her request w/ Borderline levels to Dog/Cat, Grasses/Trees, and low level reaction to Ragweed...  CHOL>  FLP at goals on Lip20;  Continue same...  Hypothyroid>  Stable on Synthroid 100...  Colon Polyps>  She will be due for f/u colon here 5/14...  Mild osteopenia & Vit D defic>  On Calcium, MVI, VitD; we discussed her calcium supplement intol & rec Viactiv...  Other medical issues as noted...     Plan:     Patient's Medications  New Prescriptions   No medications on file  Previous Medications   ATORVASTATIN (LIPITOR) 20 MG TABLET    Take 20 mg by mouth daily.   ESTROPIPATE (OGEN) 0.75 MG TABLET    Take 0.75 mg by mouth daily.   MEDROXYPROGESTERONE (PROVERA) 2.5 MG TABLET    Take 2.5 mg by mouth daily.   MELOXICAM (MOBIC) 15 MG TABLET    Take 15 mg by mouth daily.   TERBINAFINE (LAMISIL) 250 MG TABLET    Take 250 mg by mouth daily. Per Dr. Angelina Pih Medications   Modified Medication Previous Medication   LEVOTHYROXINE (SYNTHROID, LEVOTHROID) 100 MCG TABLET levothyroxine (SYNTHROID, LEVOTHROID) 100 MCG tablet      TAKE 1 TAB BY MOUTH ONCE DAILY...    TAKE 1 TAB BY MOUTH ONCE DAILY...  Discontinued Medications   CALCIUM-VITAMIN D (OSCAL WITH D) 500-200 MG-UNIT PER TABLET    Take 1 tablet  by mouth 2 (two) times daily.   CHOLECALCIFEROL (VITAMIN D) 2000 UNITS CAPS    Take 1 capsule by mouth  daily.   ESTROPIPATE (OGEN) 0.75 MG TABLET    Take 0.75 mg by mouth daily.   SIMVASTATIN (ZOCOR) 40 MG TABLET    TAKE 1 TABLET BY MOUTH EVERY DAY

## 2011-11-05 NOTE — Patient Instructions (Signed)
Today we updated your med list in our EPIC system...    We corrected our list to include the LIPITOR20, and the meds added by your Gyn & Podiatrist...  Try the VIACTIV calcium supplement (little choc square- ?yummy)...  Today we did your follow up fasting blood work...    Please call the PHONE TREE in a few days for your results...    Dial [id number] & when prompted enter your patient number followed by the # symbol...    Your patient number is:  [id number]#  We will arrange for a Bone density test to be done 7 we will call you w/ the results when avail...  Call for any questions...  Let's plan another physical in 1 years time.Marland KitchenMarland Kitchen

## 2011-11-06 ENCOUNTER — Other Ambulatory Visit: Payer: Self-pay | Admitting: Pulmonary Disease

## 2011-11-09 LAB — ALLERGY FULL PROFILE
Allergen, D pternoyssinus,d7: <0.1 kU/L (ref <0.35)
Bahia Grass: 0.22 kU/L (ref <0.35)
Box Elder IgE: 0.25 kU/L (ref <0.35)
Common Ragweed: 0.4 kU/L — ABNORMAL HIGH (ref <0.35)
Curvularia lunata: <0.1 kU/L (ref <0.35)
Dog Dander: 0.18 kU/L (ref <0.35)
Elm IgE: 0.16 kU/L (ref <0.35)
Fescue: 0.22 kU/L (ref <0.35)
G005 Rye, Perennial: 0.26 kU/L (ref <0.35)
G009 Red Top: 0.17 kU/L (ref <0.35)
Goldenrod: 0.16 kU/L (ref <0.35)
Helminthosporium halodes: <0.1 kU/L (ref <0.35)
House Dust Hollister: 0.29 kU/L (ref <0.35)
Oak: 0.25 kU/L (ref <0.35)
Sycamore Tree: 0.24 kU/L (ref <0.35)
Timothy Grass: 0.23 kU/L (ref <0.35)

## 2011-11-10 ENCOUNTER — Telehealth: Payer: Self-pay | Admitting: Pulmonary Disease

## 2011-11-10 NOTE — Telephone Encounter (Signed)
Spoke with pt. She states that she is returning a call to SN. She thinks that this is regarding labs. I advised that per append to labs, she has borderline allergies to cat/dog, grasses/trees, and low level to ragweed. Pt verbalized understanding, and wants to know if SN wanted her to add anything to her med regimen for allergies. I have mailed her copy of labs per her request. Please advise thanks! Allergies  Allergen Reactions  . Sulfamethoxazole W/Trimethoprim     REACTION: hives

## 2011-11-11 NOTE — Telephone Encounter (Signed)
SN has spoken with pt about her lab results.

## 2011-11-15 ENCOUNTER — Encounter: Payer: Self-pay | Admitting: Pulmonary Disease

## 2011-12-31 ENCOUNTER — Other Ambulatory Visit: Payer: Self-pay | Admitting: Pulmonary Disease

## 2012-05-14 ENCOUNTER — Other Ambulatory Visit: Payer: Self-pay | Admitting: Pulmonary Disease

## 2012-07-10 ENCOUNTER — Other Ambulatory Visit: Payer: Self-pay | Admitting: Pulmonary Disease

## 2012-09-26 ENCOUNTER — Other Ambulatory Visit: Payer: Self-pay | Admitting: Pulmonary Disease

## 2012-11-14 ENCOUNTER — Encounter: Payer: Self-pay | Admitting: Pulmonary Disease

## 2012-11-14 ENCOUNTER — Ambulatory Visit (INDEPENDENT_AMBULATORY_CARE_PROVIDER_SITE_OTHER)
Admission: RE | Admit: 2012-11-14 | Discharge: 2012-11-14 | Disposition: A | Discharge status: Home / Self Care | Payer: PRIVATE HEALTH INSURANCE | Source: Ambulatory Visit | Attending: Pulmonary Disease | Admitting: Pulmonary Disease

## 2012-11-14 ENCOUNTER — Other Ambulatory Visit (INDEPENDENT_AMBULATORY_CARE_PROVIDER_SITE_OTHER): Payer: PRIVATE HEALTH INSURANCE

## 2012-11-14 ENCOUNTER — Ambulatory Visit (INDEPENDENT_AMBULATORY_CARE_PROVIDER_SITE_OTHER): Payer: PRIVATE HEALTH INSURANCE | Admitting: Pulmonary Disease

## 2012-11-14 VITALS — BP 108/62 | HR 76 | Temp 98.4°F | Ht 64.0 in | Wt 183.0 lb

## 2012-11-14 DIAGNOSIS — Z0000 Encounter for general adult medical examination without abnormal findings: Secondary | ICD-10-CM

## 2012-11-14 DIAGNOSIS — E039 Hypothyroidism, unspecified: Secondary | ICD-10-CM

## 2012-11-14 DIAGNOSIS — M899 Disorder of bone, unspecified: Secondary | ICD-10-CM

## 2012-11-14 DIAGNOSIS — J309 Allergic rhinitis, unspecified: Secondary | ICD-10-CM

## 2012-11-14 DIAGNOSIS — E7800 Pure hypercholesterolemia, unspecified: Secondary | ICD-10-CM

## 2012-11-14 DIAGNOSIS — N6019 Diffuse cystic mastopathy of unspecified breast: Secondary | ICD-10-CM

## 2012-11-14 DIAGNOSIS — E559 Vitamin D deficiency, unspecified: Secondary | ICD-10-CM

## 2012-11-14 DIAGNOSIS — M949 Disorder of cartilage, unspecified: Secondary | ICD-10-CM

## 2012-11-14 DIAGNOSIS — D126 Benign neoplasm of colon, unspecified: Secondary | ICD-10-CM

## 2012-11-14 LAB — HEPATIC FUNCTION PANEL: Total Bilirubin: 0.7 mg/dL (ref 0.3–1.2)

## 2012-11-14 LAB — CBC WITH DIFFERENTIAL/PLATELET
Basophils Relative: 0.5 % (ref 0.0–3.0)
Eosinophils Relative: 1.6 % (ref 0.0–5.0)
HCT: 37.8 % (ref 36.0–46.0)
Lymphs Abs: 1.9 10*3/uL (ref 0.7–4.0)
MCV: 93.3 fl (ref 78.0–100.0)
Monocytes Absolute: 0.4 10*3/uL (ref 0.1–1.0)
Monocytes Relative: 8.3 % (ref 3.0–12.0)
RBC: 4.05 Mil/uL (ref 3.87–5.11)
WBC: 5.4 10*3/uL (ref 4.5–10.5)

## 2012-11-14 LAB — LIPID PANEL
Cholesterol: 135 mg/dL (ref 0–200)
HDL: 46.9 mg/dL (ref >39.00)
LDL Cholesterol: 81 mg/dL (ref 0–99)
Triglycerides: 38 mg/dL (ref 0.0–149.0)
VLDL: 7.6 mg/dL (ref 0.0–40.0)

## 2012-11-14 LAB — BASIC METABOLIC PANEL
BUN: 12 mg/dL (ref 6–23)
Calcium: 9.6 mg/dL (ref 8.4–10.5)
Creatinine, Ser: 1 mg/dL (ref 0.4–1.2)
GFR: 59.95 mL/min — ABNORMAL LOW (ref >60.00)

## 2012-11-14 LAB — URINALYSIS
Ketones, ur: NEGATIVE
Leukocytes, UA: NEGATIVE
Specific Gravity, Urine: 1.01 (ref 1.000–1.030)
Urine Glucose: NEGATIVE
pH: 6 (ref 5.0–8.0)

## 2012-11-14 MED ORDER — ATORVASTATIN CALCIUM 20 MG PO TABS: 20.0000 mg | ORAL_TABLET | Freq: Every day | ORAL | Status: DC

## 2012-11-14 NOTE — Progress Notes (Signed)
Subjective:     Patient ID: Victoria Pacheco, female   DOB: 1951/01/19, 62 y.o.   MRN: [id number]  HPI 21 y/o WF, mother of Victoria Pacheco, who has moved here from [place] to be closer to her daughter & grandchildren... she enjoys good general medical health & takes Simvastatin for Cholesterol, and Synthroid for Hypothyroidism...  records from her Internist in [place], DrEhret- reviewed>>> pertinent problems added to her prob list below & data scanned into the EMR record...   ~  November 04, 2010:  54mo ROV & CPX- doing well overall, notes some constip on calcium supplements & we discussed strategies to deal w/ this, also notes some heel pain ?plantar fasciitis & we reviewed stretching exercises, hot soaks, heel pad...    She's been on Simva40 per insurance but she notes some aching & wants to switch back to the Lip20 now that generic is avail;  FLP on Simva40 looks good> TChol 134, TG 43, HDL 46, LDL 80;  we will write for the Lipitor 20mg  per request...    She's stable on the Synthroid dose w/ TSH= 0.34 & her wt stable at 161#... Vit D level is improved to 33 from 19 on the 2000 u daily & she will continue same... we will sched a follow up BMD==> done 3/13 & looks good w/ mild osteopenia only (TScore -1.2 in left [facility], other sites normal)...  ~  November 05, 2011:  Yearly ROV & CPX>  [name] has had a good yr overall & is w/o new complaints or concerns;  She still notes some difficulty w/ Calcium supplements & asked to switch to [facility] & see if this is more palatable; being treated for tendonitis in right foot by Podiatrist & improved...    AR> states she had an allergic reaction to a dog at a pet store & she would like to know more about her allergy profile; we decided to perform RAST testing (see results below); rec to keep dog out of bedroom w/ air cleaner etc & use antihist as needed...    CHOL> On Lipitor20 now; FLP showed TChol 143, TG 48, HDL 51, LDL 82; REC> continue same...    Hypothy> On  Synthroid100; TSH= 0.47 & she is clinically euthyroid; rec to continue same...    GI> hx hyperplastic polyps in [place] 5/07 w/ rec for f/u colonoscopy 30yrs- due 5/14...    GYN> she is seeing Dr. Arnette Schaumann in [place]; on estrogen & provera RX; note pos FamHx breast ca in her mother...    Osteopenia> on Calcium, MVI, VitD; BMD here 3/13 showed TScores -0.7 in Spine, and -1.2 in left FemNeck...    Onychomycosis> Podiatrist has her on Lamisil for nail fungus... Given TDAP 3/13 & Shingles Rx written... LABS 3/13:  FLP- at goals;  Chems- wnl;  CBC- wnl;  TSH=0.47;  UA- clear...  ~  November 14, 2012:  Yearly ROV & CPX> [name] has been working w/ a Risk analyst in [place] x63yr w/ unresolved ?tendonitis (XRay was neg)- given Pred (sl better), then Mobic (no help), & tried inserts; we discussed refer to DrHewitt, Ortho for their review;  We reviewed the following medical problems during today's office visit >>     AR> RAST testing 2013 showed IgE=20, Molds were neg, Borderline levels to Cat/Dog, Borderline levels to Grasses/Trees, Low level to Ragweed (this was her worst allergen); They still haven't gotten a dog!...    CHOL> On Lipitor20; FLP 3/14 shows TChol 135, TG 38, HDL 47,  LDL 81; REC> continue same but needs more exercise (wt up 10# this yr)...    Hypothy> On Synthroid100; TSH= 1.63 & she is clinically euthyroid; rec to continue same...    GI> hx hyperplastic polyps in [place] 5/07 w/ rec for f/u colonoscopy 84yrs- due 5/14...    GYN> she is seeing Dr. Arnette Schaumann in [place]; on estrogen & provera rx; note pos FamHx breast ca in her mother (mammograms 1/14 w/ cyst).    DJD, foot pain> as above- working w/ podiatrist but right foot pain not better, we will refer to Ortho for further eval...    Osteopenia> on Calcium, MVI, VitD1000; Vit D level = 40; BMD here 2022-11-21 showed TScores -0.7 in Spine, and -1.2 in left FemNeck.    Onychomycosis> Podiatrist had her on Lamisil for nail fungus... We reviewed prob list, meds, xrays and labs>  see below for updates >> Rx written for Shingles vaccine per request... CXR 3/14 showed normal heart size, clear lungs, NAD.Marland KitchenMarland Kitchen EKG 3/14 showed NSR, rate68, NSSTTWA... LABS 3/14:  FLP- at goals on Lip20;  Chems- wnl;  CBC- wnl;  TSH=1.63;  VitD=40;  UA- clear...          Problem List:    ALLERGIC RHINITIS (ICD-477.9) - she's had allergy testing in the past- prev on shots...  she notes that she is sensitive to smoke w/ SOB & was on an inhaler in the past (diagnosed w/ reactive airways disease)... she states her breathing & allergy symptoms really improved after her cockateel died! ~  November 21, 2022: She noted allergic reaction on exposure to a dog in a pet store; we decided to proceed w/ RAST Panel:  IgE=20;  Molds were neg;  Borderline levels to Cat/Dog;  Borderline levels to Grasses/Trees;  Low level to Ragweed (this was her worst allergen)...  HYPERCHOLESTEROLEMIA (ICD-272.0) - prev on Lip20 & switched to SIMVASTATIN 40mg /d per her insurance... last labs in [place] 7/10 were good on this med + low fat diet... ~  FLP here on Simva40 3/11 showed TChol 144, TG 37, HDL 63, LDL 74 ~  11/21/22: on Lipitor20 now; FLP showed TChol 143, TG 48, HDL 51, LDL 82; REC> continue same ~  3/14: on Lipitor20; FLP 3/14 shows TChol 135, TG 38, HDL 47, LDL 81; REC> continue same but needs more exercise (wt up 10# this yr).  HYPOTHYROIDISM (ICD-244.9) - she relates hx enlarged thyroid in 1989 w/ RAI therapy... subsequently started on Synthroid and doing well on 112 micrograms/d x yrs now... ~  labs 3/11 here on Synth112 showed TSH= 0.19 (0.35-5.50) ~  labs 8/11 on Synth112 showed TSH= 0.16 and we decided to decr her to Synthroid 155mcg/d. ~  11/21/22: on Synthroid100; TSH= 0.47 & she is clinically euthyroid; rec to continue same. ~  3/14: on Synthroid100; TSH= 1.63 & she is clinically euthyroid; rec to continue same  COLONIC POLYPS (ICD-211.3) - she gives hx of initial colonoscopy 5/07 in [place] showing 2 tiny polyps- hyperplastic  on path, told to repeat 5-7 yrs by her [place] physicians...  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 FIBROCYSTIC BREAST DISEASE (ICD-610.1) - she takes estrogen & progesterone meds from her GYN in [place]... she will be establishing w/ GYN here in [place] soon... last mammogram was 10/10 & told to f/u 36yr... there is a pos FamHx of breast cancer in her mother...  Hx of KNEE INJURY, LEFT (ICD-959.7) - occas left knee pain due to old  injury... she uses OTC meds w/ relief & wears a brace Prn...  OSTEOPENIA (ICD-733.90) - she had BMDs in [place] in 2007 & 2008> encouraged to take OSCAL Bid + MVI... ~  BMD 3/07 showed TScores -1.8 in Spine, and -0.1 in left Hip... ~  BMD 6/09 showed TScores -1.2 in Spine, and -0.1 in Hip ~  3/13: on Calcium, MVI, VitD; BMD here 3/13 showed TScores -0.7 in Spine, and -1.2 in left FemNeck.Marland Kitchen  VITAMIN D DEFICIENCY (ICD-268.9) - rec to start Vit D supplement ~2000 u daily... ~  labs here 3/11 showed Vit D level = 19... rec> start ~2000 u daily. ~  3/14:  Vit D level = 40... Continue daily OTC supplement...  Hx of LIPOMAS, MULTIPLE (ICD-214.9) - hx mult lipomas, several removed over the yrs... and treatment for ONYCHOMYCOSIS w/ Lamisil.   Past Surgical History  Procedure Laterality Date  . Tonsillectomy and adenoidectomy      at age 31  . Wisdom tooth extraction      as a teen  . Benign right breast biopsy  2002    Outpatient Encounter Prescriptions as of 11/14/2012  Medication Sig Dispense Refill  . atorvastatin (LIPITOR) 20 MG tablet TAKE 1 TABLET BY MOUTH EVERY DAY  90 tablet  0  . estropipate (OGEN) 0.75 MG tablet Take 0.75 mg by mouth daily.      Marland Kitchen levothyroxine (SYNTHROID, LEVOTHROID) 100 MCG tablet TAKE 1 TAB BY MOUTH ONCE DAILY...  90 tablet  1  . medroxyPROGESTERone (PROVERA) 2.5 MG tablet Take 2.5 mg by mouth daily.      . [DISCONTINUED] meloxicam (MOBIC) 15 MG tablet Take 15 mg by mouth daily.       . [DISCONTINUED] simvastatin (ZOCOR) 40 MG tablet TAKE 1 TABLET BY MOUTH EVERY DAY  30 tablet  8  . [DISCONTINUED] terbinafine (LAMISIL) 250 MG tablet Take 250 mg by mouth daily. Per Dr. Sherilyn Cooter       No facility-administered encounter medications on file as of 11/14/2012.    Allergies  Allergen Reactions  . Sulfamethoxazole W-Trimethoprim     REACTION: hives    Current Medications, Allergies, Past Medical History, Past Surgical History, Family History, and Social History were reviewed in [employer] record.    Review of Systems         See HPI - all other systems neg except as noted...  The patient denies anorexia, fever, weight loss, weight gain, vision loss, decreased hearing, hoarseness, chest pain, syncope, dyspnea on exertion, peripheral edema, prolonged cough, headaches, hemoptysis, abdominal pain, melena, hematochezia, severe indigestion/heartburn, hematuria, incontinence, muscle weakness, suspicious skin lesions, transient blindness, difficulty walking, depression, unusual weight change, abnormal bleeding, enlarged lymph nodes, and angioedema.     Objective:   Physical Exam     WD, WN, 62 y/o WF in NAD... GENERAL:  Alert & oriented; pleasant & cooperative... HEENT:  [place]/AT, EOM-wnl, PERRLA, Fundi-benign, EACs-clear, TMs-wnl, NOSE-clear, THROAT-clear & wnl. NECK:  Supple w/ full ROM; no JVD; normal carotid impulses w/o bruits; no thyromegaly or nodules palpated; no lymphadenopathy. CHEST:  Clear to P & A; without wheezes/ rales/ or rhonchi. HEART:  Regular Rhythm; without murmurs/ rubs/ or gallops. ABDOMEN:  Soft & nontender; normal bowel sounds; no organomegaly or masses detected. Tender on palp over coccyx... EXT: without deformities or arthritic changes; no varicose veins/ venous insuffic/ or edema. NEURO:  CN's intact; motor testing normal; sensory testing normal; gait normal & balance OK. DERM:  she has several lipomas.Marland KitchenMarland Kitchen  RADIOLOGY DATA:   Reviewed in the EPIC EMR & discussed w/ the patient...  LABORATORY DATA:  Reviewed in the EPIC EMR & discussed w/ the patient...   Assessment:      AR>  She has mild AR and RAST panel done at her request w/ Borderline levels to Dog/Cat, Grasses/Trees, and low level reaction to Ragweed...  CHOL>  FLP at goals on Lip20;  Continue same...  Hypothyroid>  Stable on Synthroid 100...  Colon Polyps>  She will be due for f/u colon here 5/14...  Mild osteopenia & Vit D defic>  On Calcium, MVI, VitD; we discussed her calcium supplement intol & rec Viactiv...  Other medical issues as noted...     Plan:     Patient's Medications  New Prescriptions   No medications on file  Previous Medications   ESTROPIPATE (OGEN) 0.75 MG TABLET    Take 0.75 mg by mouth daily.   MEDROXYPROGESTERONE (PROVERA) 2.5 MG TABLET    Take 2.5 mg by mouth daily.  Modified Medications   Modified Medication Previous Medication   ATORVASTATIN (LIPITOR) 20 MG TABLET atorvastatin (LIPITOR) 20 MG tablet      Take 1 tablet (20 mg total) by mouth daily.    TAKE 1 TABLET BY MOUTH EVERY DAY   LEVOTHYROXINE (SYNTHROID, LEVOTHROID) 100 MCG TABLET levothyroxine (SYNTHROID, LEVOTHROID) 100 MCG tablet      TAKE 1 TAB BY MOUTH ONCE DAILY...    TAKE 1 TAB BY MOUTH ONCE DAILY...  Discontinued Medications   MELOXICAM (MOBIC) 15 MG TABLET    Take 15 mg by mouth daily.   SIMVASTATIN (ZOCOR) 40 MG TABLET    TAKE 1 TABLET BY MOUTH EVERY DAY   TERBINAFINE (LAMISIL) 250 MG TABLET    Take 250 mg by mouth daily. Per Dr. Sherilyn Cooter

## 2012-11-14 NOTE — Patient Instructions (Addendum)
Today we updated your med list in our EPIC system...    Continue your current medications the same...  Today we did your follow up CXR, EKG, & FASTING blood work...    We will contact you w/ the results when avail...  We will arrange for an Orthopedic consult regarding your right foot pain...  We wrote for the shingles vaccine as well...  Call for any questions...  Let's continue our yearly f/u physicals, but call any time as needed for problems.Marland KitchenMarland Kitchen

## 2012-11-15 LAB — VITAMIN D 25 HYDROXY (VIT D DEFICIENCY, FRACTURES): Vit D, 25-Hydroxy: 40 ng/mL (ref 30–89)

## 2012-11-29 ENCOUNTER — Other Ambulatory Visit: Payer: Self-pay | Admitting: Pulmonary Disease

## 2013-03-07 ENCOUNTER — Other Ambulatory Visit: Payer: Self-pay | Admitting: Pulmonary Disease

## 2013-06-15 ENCOUNTER — Telehealth: Payer: Self-pay | Admitting: Pulmonary Disease

## 2013-06-15 DIAGNOSIS — D126 Benign neoplasm of colon, unspecified: Secondary | ICD-10-CM

## 2013-06-15 NOTE — Telephone Encounter (Signed)
Per SN last ov note with this pt---she was due for her colon in 01/2013.  Order has been placed for her to be set up for the colonoscopy.  Pt is aware that the order has been placed and we will contact her with the appt.  Pt is requesting that this be done on 11/11 since this is a vacation day for her.

## 2013-06-18 ENCOUNTER — Encounter: Payer: Self-pay | Admitting: Gastroenterology

## 2013-07-02 ENCOUNTER — Ambulatory Visit (AMBULATORY_SURGERY_CENTER): Payer: Self-pay | Admitting: *Deleted

## 2013-07-02 VITALS — Ht 65.0 in | Wt 187.0 lb

## 2013-07-02 DIAGNOSIS — Z860100 Personal history of colon polyps, unspecified: Secondary | ICD-10-CM

## 2013-07-02 MED ORDER — MOVIPREP 100 G PO SOLR: ORAL | Status: DC

## 2013-07-02 NOTE — Progress Notes (Signed)
Patient denies any allergies to eggs or soy. Patient denies any problems with anesthesia.  

## 2013-07-10 ENCOUNTER — Encounter: Payer: Self-pay | Admitting: Gastroenterology

## 2013-07-16 ENCOUNTER — Encounter: Payer: Self-pay | Admitting: Gastroenterology

## 2013-07-16 ENCOUNTER — Ambulatory Visit (AMBULATORY_SURGERY_CENTER): Payer: PRIVATE HEALTH INSURANCE | Admitting: Gastroenterology

## 2013-07-16 VITALS — BP 112/71 | HR 62 | Temp 99.7°F | Resp 22 | Ht 65.0 in | Wt 187.0 lb

## 2013-07-16 DIAGNOSIS — D126 Benign neoplasm of colon, unspecified: Secondary | ICD-10-CM

## 2013-07-16 DIAGNOSIS — Z86010 Personal history of colonic polyps: Secondary | ICD-10-CM

## 2013-07-16 MED ORDER — SODIUM CHLORIDE 0.9 % IV SOLN: 500.0000 mL | INTRAVENOUS | Status: DC

## 2013-07-16 NOTE — Progress Notes (Signed)
Report to pacu RN, vss, bbs=clear 

## 2013-07-16 NOTE — Op Note (Signed)
[facility] [address] [place], [address]   COLONOSCOPY PROCEDURE REPORT  PATIENT: Victoria Pacheco, Victoria Pacheco  MR#: [id number] BIRTHDATE: [birth year] , 62  yrs. old GENDER: Female ENDOSCOPIST: Mardella Layman, MD, [facility] REFERRED XB:JYNWG Kriste Basque, M.D. PROCEDURE DATE:  07/16/2013 PROCEDURE:   Colonoscopy with biopsy First Screening Colonoscopy - Avg.  risk and is 50 yrs.  old or older - No.          Polyps Removed Today? Yes. ASA CLASS:   Class II INDICATIONS:Patient's personal history of adenomatous colon polyps.  MEDICATIONS: propofol (Diprivan) 350mg  IV  DESCRIPTION OF PROCEDURE:   After the risks benefits and alternatives of the procedure were thoroughly explained, informed consent was obtained.  A digital rectal exam revealed no abnormalities of the rectum.   The     endoscope was introduced through the anus and advanced to the cecum, which was identified by both the appendix and ileocecal valve. No adverse events experienced.   Limited by a tortuous and redundant colon.   The quality of the prep was excellent, using MoviPrep  The instrument was then slowly withdrawn as the colon was fully examined.      COLON FINDINGS: A small smooth flat polyp was found in the rectum. A biopsy was performed using cold forceps.   A normal appearing cecum, ileocecal valve, and appendiceal orifice were identified. The ascending, hepatic flexure, transverse, splenic flexure, descending, sigmoid colon and rectum appeared unremarkable.Very long and redundant colon !!!!!!  No polyps or cancers were seen. Retroflexed views revealed no abnormalities. The time to cecum=9 minutes 17 seconds.  Withdrawal time=7 minutes 41 seconds.  The scope was withdrawn and the procedure completed. COMPLICATIONS: There were no complications.  ENDOSCOPIC IMPRESSION: 1.   Small flat polyp was found in the rectum; biopsy was performed using cold forceps 2.   Normal colon..very long and redundant and  tortuous colon notedn....  RECOMMENDATIONS: 1.  Repeat colonoscopy in 5 years if polyp adenomatous; otherwise 10 years 2.  Await biopsy results 3.  High fiber diet 4.  Metamucil or benefiber   eSigned:  Mardella Layman, MD, [facility] 07/16/2013 11:10 AM   cc:

## 2013-07-16 NOTE — Progress Notes (Signed)
Patient did not experience any of the following events: a burn prior to discharge; a fall within the facility; wrong site/side/patient/procedure/implant event; or a hospital transfer or hospital admission upon discharge from the facility. (G8907) Patient did not have preoperative order for IV antibiotic SSI prophylaxis. (G8918)  

## 2013-07-16 NOTE — Progress Notes (Signed)
Called into room after procedure over.  Verified pt with tech and CRNA.  Pathology information given per D Georgina Pillion

## 2013-07-16 NOTE — Patient Instructions (Signed)
Colon polyp x 1 removed today. Handouts given on polyps and high fiber diet. Resume current medications. May use metamucil or benefiber as directed. Call us with any questions or concerns. Thank you!!  YOU HAD AN ENDOSCOPIC PROCEDURE TODAY AT THE  ENDOSCOPY CENTER: Refer to the procedure report that was given to you for any specific questions about what was found during the examination.  If the procedure report does not answer your questions, please call your gastroenterologist to clarify.  If you requested that your care partner not be given the details of your procedure findings, then the procedure report has been included in a sealed envelope for you to review at your convenience later.  YOU SHOULD EXPECT: Some feelings of bloating in the abdomen. Passage of more gas than usual.  Walking can help get rid of the air that was put into your GI tract during the procedure and reduce the bloating. If you had a lower endoscopy (such as a colonoscopy or flexible sigmoidoscopy) you may notice spotting of blood in your stool or on the toilet paper. If you underwent a bowel prep for your procedure, then you may not have a normal bowel movement for a few days.  DIET: Your first meal following the procedure should be a light meal and then it is ok to progress to your normal diet.  A half-sandwich or bowl of soup is an example of a good first meal.  Heavy or fried foods are harder to digest and may make you feel nauseous or bloated.  Likewise meals heavy in dairy and vegetables can cause extra gas to form and this can also increase the bloating.  Drink plenty of fluids but you should avoid alcoholic beverages for 24 hours.  ACTIVITY: Your care partner should take you home directly after the procedure.  You should plan to take it easy, moving slowly for the rest of the day.  You can resume normal activity the day after the procedure however you should NOT DRIVE or use heavy machinery for 24 hours (because of the  sedation medicines used during the test).    SYMPTOMS TO REPORT IMMEDIATELY: A gastroenterologist can be reached at any hour.  During normal business hours, 8:30 AM to 5:00 PM Monday through Friday, call [phone].  After hours and on weekends, please call the GI answering service at [phone] who will take a message and have the physician on call contact you.   Following lower endoscopy (colonoscopy or flexible sigmoidoscopy):  Excessive amounts of blood in the stool  Significant tenderness or worsening of abdominal pains  Swelling of the abdomen that is new, acute  Fever of 100F or higher  Following upper endoscopy (EGD)  Vomiting of blood or coffee ground material  New chest pain or pain under the shoulder blades  Painful or persistently difficult swallowing  New shortness of breath  Fever of 100F or higher  Black, tarry-looking stools  FOLLOW UP: If any biopsies were taken you will be contacted by phone or by letter within the next 1-3 weeks.  Call your gastroenterologist if you have not heard about the biopsies in 3 weeks.  Our staff will call the home number listed on your records the next business day following your procedure to check on you and address any questions or concerns that you may have at that time regarding the information given to you following your procedure. This is a courtesy call and so if there is no answer at the home  number and we have not heard from you through the emergency physician on call, we will assume that you have returned to your regular daily activities without incident.  SIGNATURES/CONFIDENTIALITY: You and/or your care partner have signed paperwork which will be entered into your electronic medical record.  These signatures attest to the fact that that the information above on your After Visit Summary has been reviewed and is understood.  Full responsibility of the confidentiality of this discharge information lies with you and/or your  care-partner.

## 2013-07-17 ENCOUNTER — Telehealth: Payer: Self-pay | Admitting: *Deleted

## 2013-07-17 NOTE — Telephone Encounter (Signed)
Left message on number given in admitting yesterday to return call if problems or questions. ewm

## 2013-07-20 ENCOUNTER — Encounter: Payer: Self-pay | Admitting: Gastroenterology

## 2013-11-16 ENCOUNTER — Ambulatory Visit: Payer: PRIVATE HEALTH INSURANCE | Admitting: Pulmonary Disease

## 2013-11-20 ENCOUNTER — Other Ambulatory Visit (INDEPENDENT_AMBULATORY_CARE_PROVIDER_SITE_OTHER): Payer: PRIVATE HEALTH INSURANCE

## 2013-11-20 ENCOUNTER — Ambulatory Visit (INDEPENDENT_AMBULATORY_CARE_PROVIDER_SITE_OTHER): Payer: PRIVATE HEALTH INSURANCE | Admitting: Pulmonary Disease

## 2013-11-20 ENCOUNTER — Encounter: Payer: Self-pay | Admitting: Pulmonary Disease

## 2013-11-20 VITALS — BP 126/78 | HR 73 | Temp 98.5°F | Ht 64.0 in | Wt 184.2 lb

## 2013-11-20 DIAGNOSIS — D126 Benign neoplasm of colon, unspecified: Secondary | ICD-10-CM

## 2013-11-20 DIAGNOSIS — M899 Disorder of bone, unspecified: Secondary | ICD-10-CM

## 2013-11-20 DIAGNOSIS — Z0000 Encounter for general adult medical examination without abnormal findings: Secondary | ICD-10-CM

## 2013-11-20 DIAGNOSIS — E039 Hypothyroidism, unspecified: Secondary | ICD-10-CM

## 2013-11-20 DIAGNOSIS — E7800 Pure hypercholesterolemia, unspecified: Secondary | ICD-10-CM

## 2013-11-20 DIAGNOSIS — J309 Allergic rhinitis, unspecified: Secondary | ICD-10-CM

## 2013-11-20 DIAGNOSIS — N6019 Diffuse cystic mastopathy of unspecified breast: Secondary | ICD-10-CM

## 2013-11-20 DIAGNOSIS — D179 Benign lipomatous neoplasm, unspecified: Secondary | ICD-10-CM

## 2013-11-20 DIAGNOSIS — M949 Disorder of cartilage, unspecified: Secondary | ICD-10-CM

## 2013-11-20 DIAGNOSIS — E559 Vitamin D deficiency, unspecified: Secondary | ICD-10-CM

## 2013-11-20 LAB — BASIC METABOLIC PANEL
BUN: 10 mg/dL (ref 6–23)
CALCIUM: 9.6 mg/dL (ref 8.4–10.5)
CO2: 29 mEq/L (ref 19–32)
Chloride: 101 mEq/L (ref 96–112)
Creatinine, Ser: 1 mg/dL (ref 0.4–1.2)
GFR: 63.4 mL/min (ref >60.00)
GLUCOSE: 100 mg/dL — AB (ref 70–99)
Potassium: 4.3 mEq/L (ref 3.5–5.1)
Sodium: 138 mEq/L (ref 135–145)

## 2013-11-20 LAB — CBC WITH DIFFERENTIAL/PLATELET
BASOS PCT: 0.5 % (ref 0.0–3.0)
Basophils Absolute: 0 10*3/uL (ref 0.0–0.1)
EOS PCT: 1.5 % (ref 0.0–5.0)
Eosinophils Absolute: 0.1 10*3/uL (ref 0.0–0.7)
HEMATOCRIT: 39.3 % (ref 36.0–46.0)
HEMOGLOBIN: 13 g/dL (ref 12.0–15.0)
LYMPHS ABS: 2.3 10*3/uL (ref 0.7–4.0)
Lymphocytes Relative: 61.9 % — ABNORMAL HIGH (ref 12.0–46.0)
MCHC: 33 g/dL (ref 30.0–36.0)
MCV: 93.5 fl (ref 78.0–100.0)
MONOS PCT: 9.3 % (ref 3.0–12.0)
Monocytes Absolute: 0.3 10*3/uL (ref 0.1–1.0)
NEUTROS ABS: 1 10*3/uL — AB (ref 1.4–7.7)
Neutrophils Relative %: 26.8 % — ABNORMAL LOW (ref 43.0–77.0)
PLATELETS: 167 10*3/uL (ref 150.0–400.0)
RBC: 4.21 Mil/uL (ref 3.87–5.11)
RDW: 12.4 % (ref 11.5–14.6)
WBC: 3.7 10*3/uL — AB (ref 4.5–10.5)

## 2013-11-20 LAB — LIPID PANEL
CHOLESTEROL: 197 mg/dL (ref 0–200)
HDL: 40.1 mg/dL (ref >39.00)
LDL CALC: 136 mg/dL — AB (ref 0–99)
Total CHOL/HDL Ratio: 5
Triglycerides: 106 mg/dL (ref 0.0–149.0)
VLDL: 21.2 mg/dL (ref 0.0–40.0)

## 2013-11-20 LAB — TSH: TSH: 2.74 u[IU]/mL (ref 0.35–5.50)

## 2013-11-20 LAB — HEPATIC FUNCTION PANEL
ALBUMIN: 4.2 g/dL (ref 3.5–5.2)
ALT: 23 U/L (ref 0–35)
AST: 30 U/L (ref 0–37)
Alkaline Phosphatase: 64 U/L (ref 39–117)
BILIRUBIN TOTAL: 0.5 mg/dL (ref 0.3–1.2)
Bilirubin, Direct: 0 mg/dL (ref 0.0–0.3)
Total Protein: 7.4 g/dL (ref 6.0–8.3)

## 2013-11-20 MED ORDER — BENZONATATE 100 MG PO CAPS: 100.0000 mg | ORAL_CAPSULE | Freq: Three times a day (TID) | ORAL | Status: DC | PRN

## 2013-11-20 NOTE — Patient Instructions (Signed)
Today we updated your med list in our EPIC system...    Continue your current medications the same...  For your allergic rhinitis>>    Start an OTC antihistamine like Claritin, Zyrtek, or Allegra daily...    Use the OTC FLONASE- 2 sprays in each nostril at bedtime...    Use the OTC MUCINEX 600mg  tabs- 2 tabs twice daily w/ fluids...    And finally the nasal saline mist - spray each side up to every 1-2h as needed...  Finish your current Augmentin antibiotic & we refilled the Tessalon perles for as needed use (for cough).  Today we did your follow up blood work including the lipid profile (on diet alone)    We will contact you w/ the results when available, and we can decide about restating meds at that time (eg- Simvastatin)  Call for any questions or if we can be of service in any way.Marland KitchenMarland Kitchen
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
## 2014-02-18 LAB — HM MAMMOGRAPHY

## 2014-04-09 ENCOUNTER — Other Ambulatory Visit: Payer: Self-pay | Admitting: Pulmonary Disease

## 2014-05-06 ENCOUNTER — Other Ambulatory Visit: Payer: Self-pay | Admitting: Pulmonary Disease

## 2014-07-08 ENCOUNTER — Encounter: Payer: Self-pay | Admitting: Physician Assistant

## 2014-07-08 ENCOUNTER — Ambulatory Visit (INDEPENDENT_AMBULATORY_CARE_PROVIDER_SITE_OTHER): Payer: PRIVATE HEALTH INSURANCE | Admitting: Physician Assistant

## 2014-07-08 VITALS — BP 138/65 | HR 74 | Temp 98.9°F | Resp 16 | Ht 64.0 in | Wt 183.5 lb

## 2014-07-08 DIAGNOSIS — E7800 Pure hypercholesterolemia, unspecified: Secondary | ICD-10-CM

## 2014-07-08 DIAGNOSIS — E033 Postinfectious hypothyroidism: Secondary | ICD-10-CM

## 2014-07-08 DIAGNOSIS — E559 Vitamin D deficiency, unspecified: Secondary | ICD-10-CM

## 2014-07-08 DIAGNOSIS — M8580 Other specified disorders of bone density and structure, unspecified site: Secondary | ICD-10-CM

## 2014-07-08 DIAGNOSIS — Z23 Encounter for immunization: Secondary | ICD-10-CM

## 2014-07-08 DIAGNOSIS — N959 Unspecified menopausal and perimenopausal disorder: Secondary | ICD-10-CM

## 2014-07-08 LAB — HEPATIC FUNCTION PANEL
ALT: 13 U/L (ref 0–35)
AST: 14 U/L (ref 0–37)
Albumin: 3.7 g/dL (ref 3.5–5.2)
Alkaline Phosphatase: 68 U/L (ref 39–117)
Bilirubin, Direct: 0.1 mg/dL (ref 0.0–0.3)
Total Bilirubin: 0.6 mg/dL (ref 0.2–1.2)
Total Protein: 7.3 g/dL (ref 6.0–8.3)

## 2014-07-08 LAB — LIPID PANEL
CHOL/HDL RATIO: 4
Cholesterol: 209 mg/dL — ABNORMAL HIGH (ref 0–200)
HDL: 47.2 mg/dL (ref >39.00)
LDL Cholesterol: 152 mg/dL — ABNORMAL HIGH (ref 0–99)
NONHDL: 161.8
Triglycerides: 49 mg/dL (ref 0.0–149.0)
VLDL: 9.8 mg/dL (ref 0.0–40.0)

## 2014-07-08 MED ORDER — LEVOTHYROXINE SODIUM 100 MCG PO TABS: ORAL_TABLET | ORAL | Status: DC

## 2014-07-08 NOTE — Progress Notes (Signed)
Patient presents to clinic today to establish care.  Chronic Issues: Hypothyroidism -- Originally with Hashimoto Thyroiditis s/o RAI. Currently on Levothyroxine sodium 100 mcg daily.  TSH has been stable previously on current dose of medication. Needs repeat TSH. Endorses some weight gain over the past couple of months.  Osteopenia -- Endorses last DEXA in 2013.  Is currently on Vitamin D supplementation.  Exercises regularly. Is performing   Hypercholesterolemia -- Previously prescribed Lipitor 20 mg daily.  Has not taken since May secondary to myalgias.    Last LDL at 136 taken on 11/20/13.    Vitamin D Deficiency -- Currently on supplementation.  Is overdue for labs.  Postmenopausal -- With significant hot flashes.  Prescribed Ogen and Provera by OB/GYN ( Dr. Teryl Lucy). Is seen every 6 months.   Health Maintenance: Dental -- up-to-date Vision -- up-to-date Immunizations -- Flu shot up-to-date per patient. Will be getting Zostavax today. Colonoscopy -- 2014; no abnormal findings. Repeat in 2024. Mammogram -- June 2015; no abnormal findings. PAP -- Followed by OB/GYN.  Last in 08/2013.  Denies hx of abnormal PAP smear. Bone Density -- 2013.  Due for repeat.   Past Medical History  Diagnosis Date  . Allergic rhinitis   . Hypercholesteremia   . Hypothyroidism   . Hx of colonic polyps   . Unspecified menopausal and postmenopausal disorder   . Diffuse cystic mastopathy   . Left knee injury   . Osteopenia   . Vitamin D deficiency   . Multiple lipomas   . Asthma     Past Surgical History  Procedure Laterality Date  . Tonsillectomy and adenoidectomy      at age 10  . Wisdom tooth extraction      as a teen  . Benign right breast biopsy  2002    Current Outpatient Prescriptions on File Prior to Visit  Medication Sig Dispense Refill  . Cholecalciferol (VITAMIN D-3) 1000 UNITS CAPS Take 1 capsule by mouth daily.    Marland Kitchen estropipate (OGEN) 0.75 MG tablet Take 0.75 mg by  mouth daily.    . medroxyPROGESTERone (PROVERA) 2.5 MG tablet Take 2.5 mg by mouth daily.    Marland Kitchen atorvastatin (LIPITOR) 20 MG tablet Take 1 tablet (20 mg total) by mouth daily. 90 tablet 3   No current facility-administered medications on file prior to visit.    Allergies  Allergen Reactions  . Bee Venom Anaphylaxis  . Sulfamethoxazole-Trimethoprim Hives and Itching    Bactrim=REACTION: hives    Family History  Problem Relation Age of Onset  . Colon polyps Father 13  . Breast cancer Mother 66    Deceased-61  . Colon cancer Neg Hx   . Hypertension Father   . Alzheimer's disease Father     Deceased-95  . Alzheimer's disease Paternal Aunt   . Throat cancer Paternal Uncle   . Lung cancer Paternal Uncle   . Prostate cancer Paternal Uncle   . Cancer Maternal Uncle   . Healthy Daughter     History   Social History  . Marital Status: Married    Spouse Name: james x 40 + yrs    Number of Children: 1  . Years of Education: N/A   Occupational History  . Not on file.   Social History Main Topics  . Smoking status: Never Smoker   . Smokeless tobacco: Never Used  . Alcohol Use: Yes     Comment: social use  . Drug Use: No  . Sexual Activity:  Not on file   Other Topics Concern  . Not on file   Social History Narrative   ROS See HPI.  All other ROS are negative.  BP 138/65 mmHg  Pulse 74  Temp(Src) 98.9 F (37.2 C) (Oral)  Resp 16  Ht 5\' 4"  (1.626 m)  Wt 183 lb 8 oz (83.235 kg)  BMI 31.48 kg/m2  SpO2 99%  Physical Exam  Constitutional: She is oriented to person, place, and time and well-developed, well-nourished, and in no distress.  HENT:  Head: Normocephalic and atraumatic.  Right Ear: External ear normal.  Left Ear: External ear normal.  Nose: Nose normal.  Mouth/Throat: Oropharynx is clear and moist. No oropharyngeal exudate.  TM within normal limits bilaterally.  Eyes: Conjunctivae are normal.  Neck: Neck supple. No thyromegaly present.    Cardiovascular: Normal rate, regular rhythm, normal heart sounds and intact distal pulses.   Pulmonary/Chest: Effort normal and breath sounds normal. No respiratory distress. She has no wheezes. She has no rales. She exhibits no tenderness.  Lymphadenopathy:    She has no cervical adenopathy.  Neurological: She is alert and oriented to person, place, and time.  Skin: Skin is warm and dry. No rash noted.  Psychiatric: Affect normal.  Vitals reviewed.  Assessment/Plan: Postinfectious hypothyroidism Levothyroxine refilled today.  Will check repeat TSH to assess therapeutic dosing of medication.  Osteopenia Last DEXA 2 years ago.  Continue Vitamin D supplement.  Defers DEXA at present until next CPE. Continue weight-bearing exercise.  Vitamin D deficiency Continue supplementation.  Encouraged outdoor activities.  Menopausal and postmenopausal disorder Followed by OB/GYN.  Continue current regimen.  Mammogram and PAP up-to-date per patient.  Hypercholesterolemia DC's Lipitor due to myalgias.  Will check fasting lipid panel today.  Will restart a medication if indicated by results.  Encouraged low cholesterol, low fat diet.

## 2014-07-08 NOTE — Assessment & Plan Note (Signed)
Levothyroxine refilled today.  Will check repeat TSH to assess therapeutic dosing of medication.

## 2014-07-08 NOTE — Assessment & Plan Note (Signed)
DC's Lipitor due to myalgias.  Will check fasting lipid panel today.  Will restart a medication if indicated by results.  Encouraged low cholesterol, low fat diet.

## 2014-07-08 NOTE — Assessment & Plan Note (Signed)
Continue supplementation.  Encouraged outdoor activities.

## 2014-07-08 NOTE — Assessment & Plan Note (Addendum)
Last DEXA 2 years ago.  Continue Vitamin D supplement.  Defers DEXA at present until next CPE. Continue weight-bearing exercise.

## 2014-07-08 NOTE — Patient Instructions (Addendum)
Please stop by the lab for blood work.  Continue medications as directed for now, unless directed otherwise when we call you with results.   Please follow-up with your OB/GYN as scheduled.   Follow-up in 3 months.   Hypothyroidism The thyroid is a large gland located in the lower front of your neck. The thyroid gland helps control metabolism. Metabolism is how your body handles food. It controls metabolism with the hormone thyroxine. When this gland is underactive (hypothyroid), it produces too little hormone.  CAUSES These include:   Absence or destruction of thyroid tissue.  Goiter due to iodine deficiency.  Goiter due to medications.  Congenital defects (since birth).  Problems with the pituitary. This causes a lack of TSH (thyroid stimulating hormone). This hormone tells the thyroid to turn out more hormone. SYMPTOMS  Lethargy (feeling as though you have no energy)  Cold intolerance  Weight gain (in spite of normal food intake)  Dry skin  Coarse hair  Menstrual irregularity (if severe, may lead to infertility)  Slowing of thought processes Cardiac problems are also caused by insufficient amounts of thyroid hormone. Hypothyroidism in the newborn is cretinism, and is an extreme form. It is important that this form be treated adequately and immediately or it will lead rapidly to retarded physical and mental development. DIAGNOSIS  To prove hypothyroidism, your caregiver may do blood tests and ultrasound tests. Sometimes the signs are hidden. It may be necessary for your caregiver to watch this illness with blood tests either before or after diagnosis and treatment. TREATMENT  Low levels of thyroid hormone are increased by using synthetic thyroid hormone. This is a safe, effective treatment. It usually takes about four weeks to gain the full effects of the medication. After you have the full effect of the medication, it will generally take another four weeks for problems to  leave. Your caregiver may start you on low doses. If you have had heart problems the dose may be gradually increased. It is generally not an emergency to get rapidly to normal. HOME CARE INSTRUCTIONS   Take your medications as your caregiver suggests. Let your caregiver know of any medications you are taking or start taking. Your caregiver will help you with dosage schedules.  As your condition improves, your dosage needs may increase. It will be necessary to have continuing blood tests as suggested by your caregiver.  Report all suspected medication side effects to your caregiver. SEEK MEDICAL CARE IF: Seek medical care if you develop:  Sweating.  Tremulousness (tremors).  Anxiety.  Rapid weight loss.  Heat intolerance.  Emotional swings.  Diarrhea.  Weakness. SEEK IMMEDIATE MEDICAL CARE IF:  You develop chest pain, an irregular heart beat (palpitations), or a rapid heart beat. MAKE SURE YOU:   Understand these instructions.  Will watch your condition.  Will get help right away if you are not doing well or get worse. Document Released: 08/23/2005 Document Revised: 11/15/2011 Document Reviewed: 04/12/2008 [facility] Patient Information 2015 [place], [place]. This information is not intended to replace advice given to you by your health care provider. Make sure you discuss any questions you have with your health care provider.

## 2014-07-08 NOTE — Assessment & Plan Note (Signed)
Followed by OB/GYN.  Continue current regimen.  Mammogram and PAP up-to-date per patient.

## 2014-07-08 NOTE — Addendum Note (Signed)
Addended by: Rockwell Germany on: 07/08/2014 09:11 AM   Modules accepted: Orders

## 2014-07-08 NOTE — Progress Notes (Signed)
Pre visit review using our clinic review tool, if applicable. No additional management support is needed unless otherwise documented below in the visit note/SLS  

## 2014-07-09 LAB — TSH: TSH: 1.18 u[IU]/mL (ref 0.35–4.50)

## 2014-08-16 ENCOUNTER — Other Ambulatory Visit: Payer: Self-pay | Admitting: Physician Assistant

## 2014-10-08 ENCOUNTER — Encounter: Payer: Self-pay | Admitting: Physician Assistant

## 2014-10-08 ENCOUNTER — Ambulatory Visit (INDEPENDENT_AMBULATORY_CARE_PROVIDER_SITE_OTHER): Payer: PRIVATE HEALTH INSURANCE | Admitting: Physician Assistant

## 2014-10-08 VITALS — BP 126/85 | HR 73 | Temp 98.6°F | Resp 16 | Ht 64.0 in | Wt 185.1 lb

## 2014-10-08 DIAGNOSIS — E785 Hyperlipidemia, unspecified: Secondary | ICD-10-CM

## 2014-10-08 MED ORDER — LOVASTATIN 20 MG PO TABS
20.0000 mg | ORAL_TABLET | Freq: Every day | ORAL | Status: DC
Start: 2014-10-08 — End: 2015-01-06

## 2014-10-08 NOTE — Progress Notes (Signed)
Patient presents to clinic today for medication management regarding her hyperlipidemia.   LDL at 152 at last check 3 months ago, increased from 136 the time before.  Patient previously on Lipitor for her high cholesterol but stopped the medication for several months due to myalgias.  Had recommended Mevacor or Livalo at last visit due to low risk of myalgias/arthralgias.  Patient was to call us back when she had decided to proceed with medication, but was never heard from.  Past Medical History  Diagnosis Date  . Allergic rhinitis   . Hypercholesteremia   . Hypothyroidism   . Hx of colonic polyps   . Unspecified menopausal and postmenopausal disorder   . Diffuse cystic mastopathy   . Left knee injury   . Osteopenia   . Vitamin D deficiency   . Multiple lipomas   . Asthma     Current Outpatient Prescriptions on File Prior to Visit  Medication Sig Dispense Refill  . Cholecalciferol (VITAMIN D-3) 1000 UNITS CAPS Take 1 capsule by mouth daily.    Marland Kitchen estropipate (OGEN) 0.75 MG tablet Take 0.75 mg by mouth daily.    Marland Kitchen levothyroxine (SYNTHROID, LEVOTHROID) 100 MCG tablet TAKE 1 TABLET BY MOUTH EVERY DAY 90 tablet 1  . medroxyPROGESTERone (PROVERA) 2.5 MG tablet Take 2.5 mg by mouth daily.     No current facility-administered medications on file prior to visit.    Allergies  Allergen Reactions  . Bee Venom Anaphylaxis  . Adhesive [Tape] Other (See Comments)  . Sulfamethoxazole-Trimethoprim Hives and Itching    Bactrim=REACTION: hives    Family History  Problem Relation Age of Onset  . Colon polyps Father 30  . Breast cancer Mother 32    Deceased-61  . Colon cancer Neg Hx   . Hypertension Father   . Alzheimer's disease Father     Deceased-95  . Alzheimer's disease Paternal Aunt   . Throat cancer Paternal Uncle   . Lung cancer Paternal Uncle   . Prostate cancer Paternal Uncle   . Cancer Maternal Uncle   . Healthy Daughter     History   Social History  . Marital  Status: Married    Spouse Name: james x 40 + yrs    Number of Children: 1  . Years of Education: N/A   Social History Main Topics  . Smoking status: Never Smoker   . Smokeless tobacco: Never Used  . Alcohol Use: Yes     Comment: social use  . Drug Use: No  . Sexual Activity: None   Other Topics Concern  . None   Social History Narrative   Review of Systems - See HPI.  All other ROS are negative.  BP 126/85 mmHg  Pulse 73  Temp(Src) 98.6 F (37 C) (Oral)  Resp 16  Ht 5\' 4"  (1.626 m)  Wt 185 lb 2 oz (83.972 kg)  BMI 31.76 kg/m2  SpO2 98%  Physical Exam  Constitutional: She is oriented to person, place, and time and well-developed, well-nourished, and in no distress.  HENT:  Head: Normocephalic and atraumatic.  Eyes: Conjunctivae are normal.  Cardiovascular: Normal rate, regular rhythm, normal heart sounds and intact distal pulses.   Pulmonary/Chest: Effort normal and breath sounds normal. No respiratory distress. She has no wheezes. She has no rales. She exhibits no tenderness.  Neurological: She is alert and oriented to person, place, and time.  Skin: Skin is warm and dry. No rash noted.  Psychiatric: Affect normal.  Vitals reviewed.  Assessment/Plan: Hyperlipidemia Will begin Mevacor 20 mg daily. Take as directed.  Limit intake of foods high in cholesterol and saturated fats.  Increase aerobic exercise.  Follow-up in 3 months for repeat lipid panel and LFTs.

## 2014-10-08 NOTE — Progress Notes (Signed)
Pre visit review using our clinic review tool, if applicable. No additional management support is needed unless otherwise documented below in the visit note/SLS  

## 2014-10-08 NOTE — Assessment & Plan Note (Signed)
Will begin Mevacor 20 mg daily. Take as directed.  Limit intake of foods high in cholesterol and saturated fats.  Increase aerobic exercise.  Follow-up in 3 months for repeat lipid panel and LFTs.

## 2014-10-08 NOTE — Patient Instructions (Signed)
Please take your prescription to [facility] -- the lovastatin should be 10.00 for a 90-day supply. Increase your aerobic exercise and limit foods high in cholesterol or saturated fats.  Follow-up with me in 3 months.  High Cholesterol High cholesterol refers to having a high level of cholesterol in your blood. Cholesterol is a white, waxy, fat-like protein that your body needs in small amounts. Your liver makes all the cholesterol you need. Excess cholesterol comes from the food you eat. Cholesterol travels in your bloodstream through your blood vessels. If you have high cholesterol, deposits (plaque) may build up on the walls of your blood vessels. This makes the arteries narrower and stiffer. Plaque increases your risk of heart attack and stroke. Work with your health care provider to keep your cholesterol levels in a healthy range. RISK FACTORS Several things can make you more likely to have high cholesterol. These include:   Eating foods high in animal fat (saturated fat) or cholesterol.  Being overweight.  Not getting enough exercise.  Having a family history of high cholesterol. SIGNS AND SYMPTOMS High cholesterol does not cause symptoms. DIAGNOSIS  Your health care provider can do a blood test to check whether you have high cholesterol. If you are older than 20, your health care provider may check your cholesterol every 4-6 years. You may be checked more often if you already have high cholesterol or other risk factors for heart disease. The blood test for cholesterol measures the following:  Bad cholesterol (LDL cholesterol). This is the type of cholesterol that causes heart disease. This number should be less than 100.  Good cholesterol (HDL cholesterol). This type helps protect against heart disease. A healthy level of HDL cholesterol is 60 or higher.  Total cholesterol. This is the combined number of LDL cholesterol and HDL cholesterol. A healthy number is less than 200. TREATMENT    High cholesterol can be treated with diet changes, lifestyle changes, and medicine.   Diet changes may include eating more whole grains, fruits, vegetables, nuts, and fish. You may also have to cut back on red meat and foods with a lot of added sugar.  Lifestyle changes may include getting at least 40 minutes of aerobic exercise three times a week. Aerobic exercises include walking, biking, and swimming. Aerobic exercise along with a healthy diet can help you maintain a healthy weight. Lifestyle changes may also include quitting smoking.  If diet and lifestyle changes are not enough to lower your cholesterol, your health care provider may prescribe a statin medicine. This medicine has been shown to lower cholesterol and also lower the risk of heart disease. HOME CARE INSTRUCTIONS  Only take over-the-counter or prescription medicines as directed by your health care provider.   Follow a healthy diet as directed by your health care provider. For instance:   Eat chicken (without skin), fish, veal, shellfish, ground [place] breast, and round or loin cuts of red meat.  Do not eat fried foods and fatty meats, such as hot dogs and salami.   Eat plenty of fruits, such as apples.   Eat plenty of vegetables, such as broccoli, potatoes, and carrots.   Eat beans, peas, and lentils.   Eat grains, such as barley, rice, couscous, and bulgur wheat.   Eat pasta without cream sauces.   Use skim or nonfat milk and low-fat or nonfat yogurt and cheeses. Do not eat or drink whole milk, cream, ice cream, egg yolks, and hard cheeses.   Do not eat stick margarine  or tub margarines that contain trans fats (also called partially hydrogenated oils).   Do not eat cakes, cookies, crackers, or other baked goods that contain trans fats.   Do not eat saturated tropical oils, such as coconut and palm oil.   Exercise as directed by your health care provider. Increase your activity level with activities  such as gardening or walking.   Keep all follow-up appointments.  SEEK MEDICAL CARE IF:  You are struggling to maintain a healthy diet or weight.  You need help starting an exercise program.  You need help to stop smoking. SEEK IMMEDIATE MEDICAL CARE IF:  You have chest pain.  You have trouble breathing. Document Released: 08/23/2005 Document Revised: 01/07/2014 Document Reviewed: 06/15/2013 [facility] Patient Information 2015 [place], [place]. This information is not intended to replace advice given to you by your health care provider. Make sure you discuss any questions you have with your health care provider.

## 2014-12-13 ENCOUNTER — Telehealth: Payer: Self-pay | Admitting: Physician Assistant

## 2014-12-13 ENCOUNTER — Other Ambulatory Visit: Payer: Self-pay | Admitting: Physician Assistant

## 2014-12-13 MED ORDER — BENZONATATE 100 MG PO CAPS: 100.0000 mg | ORAL_CAPSULE | Freq: Three times a day (TID) | ORAL | Status: DC | PRN

## 2014-12-13 NOTE — Telephone Encounter (Signed)
Please advise 

## 2014-12-13 NOTE — Telephone Encounter (Signed)
I have sent in a prescription for Tessalon Perles to help with cough.  If symptoms not improving, she will need OV

## 2014-12-13 NOTE — Telephone Encounter (Signed)
Caller name: Juliahna, [place] Relation to pt: self  Call back number: [phone] Pharmacy: [id number] - [place], [address] [phone] (Phone) [phone] (Fax         Reason for call:  Pt states her allergies is acting up and there was a gel capsule that was prescribed and it worked. Pt also is exper. Coughing and due to work appointment can not be seen today.

## 2014-12-13 NOTE — Telephone Encounter (Signed)
error:[id number] ° °

## 2014-12-13 NOTE — Telephone Encounter (Signed)
Spoke with Pt, informed Pt that [facility]sey had sent in a prescription for [employer]. Pt verbalized understanding.

## 2015-01-06 ENCOUNTER — Encounter: Payer: Self-pay | Admitting: Physician Assistant

## 2015-01-06 ENCOUNTER — Ambulatory Visit (INDEPENDENT_AMBULATORY_CARE_PROVIDER_SITE_OTHER): Payer: PRIVATE HEALTH INSURANCE | Admitting: Physician Assistant

## 2015-01-06 VITALS — BP 136/85 | HR 83 | Temp 97.0°F | Wt 185.0 lb

## 2015-01-06 DIAGNOSIS — E785 Hyperlipidemia, unspecified: Secondary | ICD-10-CM

## 2015-01-06 DIAGNOSIS — H6981 Other specified disorders of Eustachian tube, right ear: Secondary | ICD-10-CM

## 2015-01-06 DIAGNOSIS — H6980 Other specified disorders of Eustachian tube, unspecified ear: Secondary | ICD-10-CM | POA: Insufficient documentation

## 2015-01-06 MED ORDER — LOVASTATIN 20 MG PO TABS: 20.0000 mg | ORAL_TABLET | Freq: Every day | ORAL | Status: DC

## 2015-01-06 MED ORDER — LORATADINE-PSEUDOEPHEDRINE ER 10-240 MG PO TB24: 1.0000 | ORAL_TABLET | Freq: Every day | ORAL | Status: DC

## 2015-01-06 NOTE — Assessment & Plan Note (Signed)
Continue Flonase.  Rx Claritin D to take daily.  Saline nasal spray.  Follow-up if not continuing to resolve.

## 2015-01-06 NOTE — Patient Instructions (Signed)
Please continue Flonase daily. Start the Claritin D daily over the next 1-2 weeks. Stay well hydrated and continue saline nasal spray. Follow-up if symptoms are not resolving.  Consider starting the Mevacor.  It will be beneficial for your cholesterol and has lower risk of muscle aches compared to other medications in the same class.  Follow-up for a CPE.

## 2015-01-06 NOTE — Progress Notes (Signed)
Patient presents to clinic today for 97-month follow-up of hyperlipidemia.  Patient was placed on Mevacor 20 mg daily at last visit. States she did not start medication. Is trying to watch diet and exercise.    Patient also c/o ear pressure and congestion of R ear with "popping". Denies sore throat, headaches, fever, chills, fatigue.  Endorses dry cough resolved. Denies chest pain or SOB. Patient states symptoms are improving daily.  Past Medical History  Diagnosis Date  . Allergic rhinitis   . Hypercholesteremia   . Hypothyroidism   . Hx of colonic polyps   . Unspecified menopausal and postmenopausal disorder   . Diffuse cystic mastopathy   . Left knee injury   . Osteopenia   . Vitamin D deficiency   . Multiple lipomas   . Asthma     Current Outpatient Prescriptions on File Prior to Visit  Medication Sig Dispense Refill  . Cholecalciferol (VITAMIN D-3) 1000 UNITS CAPS Take 1 capsule by mouth daily.    Marland Kitchen estropipate (OGEN) 0.75 MG tablet Take 0.75 mg by mouth daily.    Marland Kitchen levothyroxine (SYNTHROID, LEVOTHROID) 100 MCG tablet TAKE 1 TABLET BY MOUTH EVERY DAY 90 tablet 1  . medroxyPROGESTERone (PROVERA) 2.5 MG tablet Take 2.5 mg by mouth daily.    . Multiple Vitamin (MULTIVITAMIN) tablet Take 1 tablet by mouth daily.     No current facility-administered medications on file prior to visit.    Allergies  Allergen Reactions  . Bee Venom Anaphylaxis  . Adhesive [Tape] Other (See Comments)  . Sulfamethoxazole-Trimethoprim Hives and Itching    Bactrim=REACTION: hives    Family History  Problem Relation Age of Onset  . Colon polyps Father 35  . Breast cancer Mother 20    Deceased-61  . Colon cancer Neg Hx   . Hypertension Father   . Alzheimer's disease Father     Deceased-95  . Alzheimer's disease Paternal Aunt   . Throat cancer Paternal Uncle   . Lung cancer Paternal Uncle   . Prostate cancer Paternal Uncle   . Cancer Maternal Uncle   . Healthy Daughter     History    Social History  . Marital Status: Married    Spouse Name: james x 40 + yrs  . Number of Children: 1  . Years of Education: N/A   Social History Main Topics  . Smoking status: Never Smoker   . Smokeless tobacco: Never Used  . Alcohol Use: Yes     Comment: social use  . Drug Use: No  . Sexual Activity: Not on file   Other Topics Concern  . None   Social History Narrative   Review of Systems - See HPI.  All other ROS are negative.  BP 136/85 mmHg  Pulse 83  Temp(Src) 97 F (36.1 C)  Wt 185 lb (83.915 kg)  SpO2 98%  Physical Exam  Constitutional: She is oriented to person, place, and time and well-developed, well-nourished, and in no distress.  HENT:  Head: Normocephalic and atraumatic.  Left Ear: External ear normal.  Nose: Nose normal.  Mouth/Throat: Oropharynx is clear and moist. No oropharyngeal exudate.  Fluid noted behind R TM  Eyes: Conjunctivae are normal.  Neck: Neck supple.  Cardiovascular: Normal rate, regular rhythm, normal heart sounds and intact distal pulses.   Pulmonary/Chest: Effort normal and breath sounds normal. No respiratory distress. She has no wheezes. She has no rales. She exhibits no tenderness.  Neurological: She is alert and oriented to person, place,  and time.  Skin: Skin is warm and dry. No rash noted.  Psychiatric: Affect normal.  Vitals reviewed.   Assessment/Plan: Hyperlipidemia Patient encouraged to begin Mevacor.  Continue diet and exercise.  Will recheck lipids at physical.   Eustachian tube dysfunction Continue Flonase.  Rx Claritin D to take daily.  Saline nasal spray.  Follow-up if not continuing to resolve.

## 2015-01-06 NOTE — Addendum Note (Signed)
Addended by: Peggyann Shoals on: 01/06/2015 07:47 AM   Modules accepted: Orders

## 2015-01-06 NOTE — Assessment & Plan Note (Signed)
Patient encouraged to begin Mevacor.  Continue diet and exercise.  Will recheck lipids at physical.

## 2015-02-10 ENCOUNTER — Other Ambulatory Visit: Payer: Self-pay | Admitting: Physician Assistant

## 2015-03-03 ENCOUNTER — Encounter: Payer: Self-pay | Admitting: *Deleted

## 2015-06-06 ENCOUNTER — Encounter: Payer: Self-pay | Admitting: Behavioral Health

## 2015-06-06 ENCOUNTER — Telehealth: Payer: Self-pay | Admitting: Behavioral Health

## 2015-06-06 NOTE — Telephone Encounter (Signed)
Patient returning your call.

## 2015-06-06 NOTE — Addendum Note (Signed)
Addended by: Eduard Roux E on: 06/06/2015 05:10 PM   Modules accepted: Medications

## 2015-06-06 NOTE — Telephone Encounter (Signed)
Pre-Visit Call completed with patient and chart updated.   Pre-Visit Info documented in Specialty Comments under [employer].    

## 2015-06-06 NOTE — Telephone Encounter (Signed)
Unable to reach patient at time of Pre-Visit Call.  Left message for patient to return call when available.    

## 2015-06-09 ENCOUNTER — Ambulatory Visit (INDEPENDENT_AMBULATORY_CARE_PROVIDER_SITE_OTHER): Payer: PRIVATE HEALTH INSURANCE | Admitting: Physician Assistant

## 2015-06-09 ENCOUNTER — Encounter: Payer: Self-pay | Admitting: Physician Assistant

## 2015-06-09 VITALS — BP 125/76 | HR 82 | Temp 98.5°F | Resp 16 | Ht 64.0 in | Wt 186.5 lb

## 2015-06-09 DIAGNOSIS — N959 Unspecified menopausal and perimenopausal disorder: Secondary | ICD-10-CM | POA: Diagnosis not present

## 2015-06-09 DIAGNOSIS — Z23 Encounter for immunization: Secondary | ICD-10-CM | POA: Diagnosis not present

## 2015-06-09 DIAGNOSIS — E785 Hyperlipidemia, unspecified: Secondary | ICD-10-CM | POA: Diagnosis not present

## 2015-06-09 DIAGNOSIS — Z0000 Encounter for general adult medical examination without abnormal findings: Secondary | ICD-10-CM | POA: Diagnosis not present

## 2015-06-09 DIAGNOSIS — E033 Postinfectious hypothyroidism: Secondary | ICD-10-CM | POA: Diagnosis not present

## 2015-06-09 LAB — COMPREHENSIVE METABOLIC PANEL
ALBUMIN: 4.2 g/dL (ref 3.5–5.2)
ALT: 13 U/L (ref 0–35)
AST: 16 U/L (ref 0–37)
Alkaline Phosphatase: 74 U/L (ref 39–117)
BILIRUBIN TOTAL: 0.5 mg/dL (ref 0.2–1.2)
BUN: 12 mg/dL (ref 6–23)
CO2: 28 mEq/L (ref 19–32)
Calcium: 9.6 mg/dL (ref 8.4–10.5)
Chloride: 105 mEq/L (ref 96–112)
Creatinine, Ser: 1.17 mg/dL (ref 0.40–1.20)
GFR: 49.6 mL/min — AB (ref >60.00)
GLUCOSE: 90 mg/dL (ref 70–99)
Potassium: 3.9 mEq/L (ref 3.5–5.1)
Sodium: 141 mEq/L (ref 135–145)
TOTAL PROTEIN: 7.1 g/dL (ref 6.0–8.3)

## 2015-06-09 LAB — TSH: TSH: 1.16 u[IU]/mL (ref 0.35–4.50)

## 2015-06-09 LAB — URINALYSIS, ROUTINE W REFLEX MICROSCOPIC
Bilirubin Urine: NEGATIVE
Hgb urine dipstick: NEGATIVE
Ketones, ur: NEGATIVE
Leukocytes, UA: NEGATIVE
Nitrite: NEGATIVE
RBC / HPF: NONE SEEN (ref 0–?)
Specific Gravity, Urine: 1.01 (ref 1.000–1.030)
Total Protein, Urine: NEGATIVE
Urine Glucose: NEGATIVE
Urobilinogen, UA: 0.2 (ref 0.0–1.0)
pH: 5.5 (ref 5.0–8.0)

## 2015-06-09 LAB — CBC
HCT: 37.6 % (ref 36.0–46.0)
HEMOGLOBIN: 12.5 g/dL (ref 12.0–15.0)
MCHC: 33.2 g/dL (ref 30.0–36.0)
MCV: 93.1 fl (ref 78.0–100.0)
Platelets: 211 10*3/uL (ref 150.0–400.0)
RBC: 4.04 Mil/uL (ref 3.87–5.11)
RDW: 12.5 % (ref 11.5–15.5)
WBC: 5.3 10*3/uL (ref 4.0–10.5)

## 2015-06-09 LAB — LIPID PANEL
CHOLESTEROL: 169 mg/dL (ref 0–200)
HDL: 49.7 mg/dL (ref >39.00)
LDL CALC: 107 mg/dL — AB (ref 0–99)
NonHDL: 119.78
TRIGLYCERIDES: 62 mg/dL (ref 0.0–149.0)
Total CHOL/HDL Ratio: 3
VLDL: 12.4 mg/dL (ref 0.0–40.0)

## 2015-06-09 LAB — HEMOGLOBIN A1C: Hgb A1c MFr Bld: 5.7 % (ref 4.6–6.5)

## 2015-06-09 NOTE — Patient Instructions (Signed)
Please go to the lab for blood work.  I will call you with your results. If your blood work is normal we will follow-up yearly for physicals.  If anything abnormal we will treat accordingly.  Preventive Care for Adults A healthy lifestyle and preventive care can promote health and wellness. Preventive health guidelines for women include the following key practices.  A routine yearly physical is a good way to check with your health care provider about your health and preventive screening. It is a chance to share any concerns and updates on your health and to receive a thorough exam.  Visit your dentist for a routine exam and preventive care every 6 months. Brush your teeth twice a day and floss once a day. Good oral hygiene prevents tooth decay and gum disease.  The frequency of eye exams is based on your age, health, family medical history, use of contact lenses, and other factors. Follow your health care provider's recommendations for frequency of eye exams.  Eat a healthy diet. Foods like vegetables, fruits, whole grains, low-fat dairy products, and lean protein foods contain the nutrients you need without too many calories. Decrease your intake of foods high in solid fats, added sugars, and salt. Eat the right amount of calories for you.Get information about a proper diet from your health care provider, if necessary.  Regular physical exercise is one of the most important things you can do for your health. Most adults should get at least 150 minutes of moderate-intensity exercise (any activity that increases your heart rate and causes you to sweat) each week. In addition, most adults need muscle-strengthening exercises on 2 or more days a week.  Maintain a healthy weight. The body mass index (BMI) is a screening tool to identify possible weight problems. It provides an estimate of body fat based on height and weight. Your health care provider can find your BMI and can help you achieve or  maintain a healthy weight.For adults 20 years and older:  A BMI below 18.5 is considered underweight.  A BMI of 18.5 to 24.9 is normal.  A BMI of 25 to 29.9 is considered overweight.  A BMI of 30 and above is considered obese.  Maintain normal blood lipids and cholesterol levels by exercising and minimizing your intake of saturated fat. Eat a balanced diet with plenty of fruit and vegetables. Blood tests for lipids and cholesterol should begin at age 47 and be repeated every 5 years. If your lipid or cholesterol levels are high, you are over 50, or you are at high risk for heart disease, you may need your cholesterol levels checked more frequently.Ongoing high lipid and cholesterol levels should be treated with medicines if diet and exercise are not working.  If you smoke, find out from your health care provider how to quit. If you do not use tobacco, do not start.  Lung cancer screening is recommended for adults aged 47-80 years who are at high risk for developing lung cancer because of a history of smoking. A yearly low-dose CT scan of the lungs is recommended for people who have at least a 30-pack-year history of smoking and are a current smoker or have quit within the past 15 years. A pack year of smoking is smoking an average of 1 pack of cigarettes a day for 1 year (for example: 1 pack a day for 30 years or 2 packs a day for 15 years). Yearly screening should continue until the smoker has stopped smoking for at  least 15 years. Yearly screening should be stopped for people who develop a health problem that would prevent them from having lung cancer treatment.  If you are pregnant, do not drink alcohol. If you are breastfeeding, be very cautious about drinking alcohol. If you are not pregnant and choose to drink alcohol, do not have more than 1 drink per day. One drink is considered to be 12 ounces (355 mL) of beer, 5 ounces (148 mL) of wine, or 1.5 ounces (44 mL) of liquor.  Avoid use of  street drugs. Do not share needles with anyone. Ask for help if you need support or instructions about stopping the use of drugs.  High blood pressure causes heart disease and increases the risk of stroke. Your blood pressure should be checked at least every 1 to 2 years. Ongoing high blood pressure should be treated with medicines if weight loss and exercise do not work.  If you are 24-32 years old, ask your health care provider if you should take aspirin to prevent strokes.  Diabetes screening involves taking a blood sample to check your fasting blood sugar level. This should be done once every 3 years, after age 41, if you are within normal weight and without risk factors for diabetes. Testing should be considered at a younger age or be carried out more frequently if you are overweight and have at least 1 risk factor for diabetes.  Breast cancer screening is essential preventive care for women. You should practice "breast self-awareness." This means understanding the normal appearance and feel of your breasts and may include breast self-examination. Any changes detected, no matter how small, should be reported to a health care provider. Women in their 26s and 30s should have a clinical breast exam (CBE) by a health care provider as part of a regular health exam every 1 to 3 years. After age 36, women should have a CBE every year. Starting at age 44, women should consider having a mammogram (breast X-ray test) every year. Women who have a family history of breast cancer should talk to their health care provider about genetic screening. Women at a high risk of breast cancer should talk to their health care providers about having an MRI and a mammogram every year.  Breast cancer gene (BRCA)-related cancer risk assessment is recommended for women who have family members with BRCA-related cancers. BRCA-related cancers include breast, ovarian, tubal, and peritoneal cancers. Having family members with these  cancers may be associated with an increased risk for harmful changes (mutations) in the breast cancer genes BRCA1 and BRCA2. Results of the assessment will determine the need for genetic counseling and BRCA1 and BRCA2 testing.  Routine pelvic exams to screen for cancer are no longer recommended for nonpregnant women who are considered low risk for cancer of the pelvic organs (ovaries, uterus, and vagina) and who do not have symptoms. Ask your health care provider if a screening pelvic exam is right for you.  If you have had past treatment for cervical cancer or a condition that could lead to cancer, you need Pap tests and screening for cancer for at least 20 years after your treatment. If Pap tests have been discontinued, your risk factors (such as having a new sexual partner) need to be reassessed to determine if screening should be resumed. Some women have medical problems that increase the chance of getting cervical cancer. In these cases, your health care provider may recommend more frequent screening and Pap tests.  The HPV test  is an additional test that may be used for cervical cancer screening. The HPV test looks for the virus that can cause the cell changes on the cervix. The cells collected during the Pap test can be tested for HPV. The HPV test could be used to screen women aged 42 years and older, and should be used in women of any age who have unclear Pap test results. After the age of 74, women should have HPV testing at the same frequency as a Pap test.  Colorectal cancer can be detected and often prevented. Most routine colorectal cancer screening begins at the age of 60 years and continues through age 87 years. However, your health care provider may recommend screening at an earlier age if you have risk factors for colon cancer. On a yearly basis, your health care provider may provide home test kits to check for hidden blood in the stool. Use of a small camera at the end of a tube, to  directly examine the colon (sigmoidoscopy or colonoscopy), can detect the earliest forms of colorectal cancer. Talk to your health care provider about this at age 52, when routine screening begins. Direct exam of the colon should be repeated every 5-10 years through age 78 years, unless early forms of pre-cancerous polyps or small growths are found.  People who are at an increased risk for hepatitis B should be screened for this virus. You are considered at high risk for hepatitis B if:  You were born in a country where hepatitis B occurs often. Talk with your health care provider about which countries are considered high risk.  Your parents were born in a high-risk country and you have not received a shot to protect against hepatitis B (hepatitis B vaccine).  You have HIV or AIDS.  You use needles to inject street drugs.  You live with, or have sex with, someone who has hepatitis B.  You get hemodialysis treatment.  You take certain medicines for conditions like cancer, organ transplantation, and autoimmune conditions.  Hepatitis C blood testing is recommended for all people born from [birth year] through 1965 and any individual with known risks for hepatitis C.  Practice safe sex. Use condoms and avoid high-risk sexual practices to reduce the spread of sexually transmitted infections (STIs). STIs include gonorrhea, chlamydia, syphilis, trichomonas, herpes, HPV, and human immunodeficiency virus (HIV). Herpes, HIV, and HPV are viral illnesses that have no cure. They can result in disability, cancer, and death.  You should be screened for sexually transmitted illnesses (STIs) including gonorrhea and chlamydia if:  You are sexually active and are younger than 24 years.  You are older than 24 years and your health care provider tells you that you are at risk for this type of infection.  Your sexual activity has changed since you were last screened and you are at an increased risk for chlamydia or  gonorrhea. Ask your health care provider if you are at risk.  If you are at risk of being infected with HIV, it is recommended that you take a prescription medicine daily to prevent HIV infection. This is called preexposure prophylaxis (PrEP). You are considered at risk if:  You are a heterosexual woman, are sexually active, and are at increased risk for HIV infection.  You take drugs by injection.  You are sexually active with a partner who has HIV.  Talk with your health care provider about whether you are at high risk of being infected with HIV. If you choose to begin  PrEP, you should first be tested for HIV. You should then be tested every 3 months for as long as you are taking PrEP.  Osteoporosis is a disease in which the bones lose minerals and strength with aging. This can result in serious bone fractures or breaks. The risk of osteoporosis can be identified using a bone density scan. Women ages 55 years and over and women at risk for fractures or osteoporosis should discuss screening with their health care providers. Ask your health care provider whether you should take a calcium supplement or vitamin D to reduce the rate of osteoporosis.  Menopause can be associated with physical symptoms and risks. Hormone replacement therapy is available to decrease symptoms and risks. You should talk to your health care provider about whether hormone replacement therapy is right for you.  Use sunscreen. Apply sunscreen liberally and repeatedly throughout the day. You should seek shade when your shadow is shorter than you. Protect yourself by wearing long sleeves, pants, a wide-brimmed hat, and sunglasses year round, whenever you are outdoors.  Once a month, do a whole body skin exam, using a mirror to look at the skin on your back. Tell your health care provider of new moles, moles that have irregular borders, moles that are larger than a pencil eraser, or moles that have changed in shape or  color.  Stay current with required vaccines (immunizations).  Influenza vaccine. All adults should be immunized every year.  Tetanus, diphtheria, and acellular pertussis (Td, Tdap) vaccine. Pregnant women should receive 1 dose of Tdap vaccine during each pregnancy. The dose should be obtained regardless of the length of time since the last dose. Immunization is preferred during the 27th-36th week of gestation. An adult who has not previously received Tdap or who does not know her vaccine status should receive 1 dose of Tdap. This initial dose should be followed by tetanus and diphtheria toxoids (Td) booster doses every 10 years. Adults with an unknown or incomplete history of completing a 3-dose immunization series with Td-containing vaccines should begin or complete a primary immunization series including a Tdap dose. Adults should receive a Td booster every 10 years.  Varicella vaccine. An adult without evidence of immunity to varicella should receive 2 doses or a second dose if she has previously received 1 dose. Pregnant females who do not have evidence of immunity should receive the first dose after pregnancy. This first dose should be obtained before leaving the health care facility. The second dose should be obtained 4-8 weeks after the first dose.  Human papillomavirus (HPV) vaccine. Females aged 13-26 years who have not received the vaccine previously should obtain the 3-dose series. The vaccine is not recommended for use in pregnant females. However, pregnancy testing is not needed before receiving a dose. If a female is found to be pregnant after receiving a dose, no treatment is needed. In that case, the remaining doses should be delayed until after the pregnancy. Immunization is recommended for any person with an immunocompromised condition through the age of 12 years if she did not get any or all doses earlier. During the 3-dose series, the second dose should be obtained 4-8 weeks after the  first dose. The third dose should be obtained 24 weeks after the first dose and 16 weeks after the second dose.  Zoster vaccine. One dose is recommended for adults aged 1 years or older unless certain conditions are present.  Measles, mumps, and rubella (MMR) vaccine. Adults born before [birth year] generally are  considered immune to measles and mumps. Adults born in [birth year] or later should have 1 or more doses of MMR vaccine unless there is a contraindication to the vaccine or there is laboratory evidence of immunity to each of the three diseases. A routine second dose of MMR vaccine should be obtained at least 28 days after the first dose for students attending postsecondary schools, health care workers, or international travelers. People who received inactivated measles vaccine or an unknown type of measles vaccine during 1963-1967 should receive 2 doses of MMR vaccine. People who received inactivated mumps vaccine or an unknown type of mumps vaccine before 1979 and are at high risk for mumps infection should consider immunization with 2 doses of MMR vaccine. For females of childbearing age, rubella immunity should be determined. If there is no evidence of immunity, females who are not pregnant should be vaccinated. If there is no evidence of immunity, females who are pregnant should delay immunization until after pregnancy. Unvaccinated health care workers born before [birth year] who lack laboratory evidence of measles, mumps, or rubella immunity or laboratory confirmation of disease should consider measles and mumps immunization with 2 doses of MMR vaccine or rubella immunization with 1 dose of MMR vaccine.  Pneumococcal 13-valent conjugate (PCV13) vaccine. When indicated, a person who is uncertain of her immunization history and has no record of immunization should receive the PCV13 vaccine. An adult aged 54 years or older who has certain medical conditions and has not been previously immunized should receive 1 dose of  PCV13 vaccine. This PCV13 should be followed with a dose of pneumococcal polysaccharide (PPSV23) vaccine. The PPSV23 vaccine dose should be obtained at least 8 weeks after the dose of PCV13 vaccine. An adult aged 74 years or older who has certain medical conditions and previously received 1 or more doses of PPSV23 vaccine should receive 1 dose of PCV13. The PCV13 vaccine dose should be obtained 1 or more years after the last PPSV23 vaccine dose.  Pneumococcal polysaccharide (PPSV23) vaccine. When PCV13 is also indicated, PCV13 should be obtained first. All adults aged 36 years and older should be immunized. An adult younger than age 39 years who has certain medical conditions should be immunized. Any person who resides in a nursing home or long-term care facility should be immunized. An adult smoker should be immunized. People with an immunocompromised condition and certain other conditions should receive both PCV13 and PPSV23 vaccines. People with human immunodeficiency virus (HIV) infection should be immunized as soon as possible after diagnosis. Immunization during chemotherapy or radiation therapy should be avoided. Routine use of PPSV23 vaccine is not recommended for American Indians, [place] Natives, or people younger than 65 years unless there are medical conditions that require PPSV23 vaccine. When indicated, people who have unknown immunization and have no record of immunization should receive PPSV23 vaccine. One-time revaccination 5 years after the first dose of PPSV23 is recommended for people aged 19-64 years who have chronic kidney failure, nephrotic syndrome, asplenia, or immunocompromised conditions. People who received 1-2 doses of PPSV23 before age 46 years should receive another dose of PPSV23 vaccine at age 66 years or later if at least 5 years have passed since the previous dose. Doses of PPSV23 are not needed for people immunized with PPSV23 at or after age 74 years.  Meningococcal vaccine.  Adults with asplenia or persistent complement component deficiencies should receive 2 doses of quadrivalent meningococcal conjugate (MenACWY-D) vaccine. The doses should be obtained at least 2 months apart. Microbiologists working  with certain meningococcal bacteria, [place] recruits, people at risk during an outbreak, and people who travel to or live in countries with a high rate of meningitis should be immunized. A first-year college student up through age 82 years who is living in a residence hall should receive a dose if she did not receive a dose on or after her 16th birthday. Adults who have certain high-risk conditions should receive one or more doses of vaccine.  Hepatitis A vaccine. Adults who wish to be protected from this disease, have certain high-risk conditions, work with hepatitis A-infected animals, work in hepatitis A research labs, or travel to or work in countries with a high rate of hepatitis A should be immunized. Adults who were previously unvaccinated and who anticipate close contact with an international adoptee during the first 60 days after arrival in the [place] from a country with a high rate of hepatitis A should be immunized.  Hepatitis B vaccine. Adults who wish to be protected from this disease, have certain high-risk conditions, may be exposed to blood or other infectious body fluids, are household contacts or sex partners of hepatitis B positive people, are clients or workers in certain care facilities, or travel to or work in countries with a high rate of hepatitis B should be immunized.  Haemophilus influenzae type b (Hib) vaccine. A previously unvaccinated person with asplenia or sickle cell disease or having a scheduled splenectomy should receive 1 dose of Hib vaccine. Regardless of previous immunization, a recipient of a hematopoietic stem cell transplant should receive a 3-dose series 6-12 months after her successful transplant. Hib vaccine is not recommended for  adults with HIV infection. Preventive Services / Frequency Ages 39 to 40 years  Blood pressure check.** / Every 1 to 2 years.  Lipid and cholesterol check.** / Every 5 years beginning at age 67.  Clinical breast exam.** / Every 3 years for women in their 59s and 86s.  BRCA-related cancer risk assessment.** / For women who have family members with a BRCA-related cancer (breast, ovarian, tubal, or peritoneal cancers).  Pap test.** / Every 2 years from ages 57 through 73. Every 3 years starting at age 7 through age 14 or 8 with a history of 3 consecutive normal Pap tests.  HPV screening.** / Every 3 years from ages 40 through ages 88 to 28 with a history of 3 consecutive normal Pap tests.  Hepatitis C blood test.** / For any individual with known risks for hepatitis C.  Skin self-exam. / Monthly.  Influenza vaccine. / Every year.  Tetanus, diphtheria, and acellular pertussis (Tdap, Td) vaccine.** / Consult your health care provider. Pregnant women should receive 1 dose of Tdap vaccine during each pregnancy. 1 dose of Td every 10 years.  Varicella vaccine.** / Consult your health care provider. Pregnant females who do not have evidence of immunity should receive the first dose after pregnancy.  HPV vaccine. / 3 doses over 6 months, if 53 and younger. The vaccine is not recommended for use in pregnant females. However, pregnancy testing is not needed before receiving a dose.  Measles, mumps, rubella (MMR) vaccine.** / You need at least 1 dose of MMR if you were born in 1957 or later. You may also need a 2nd dose. For females of childbearing age, rubella immunity should be determined. If there is no evidence of immunity, females who are not pregnant should be vaccinated. If there is no evidence of immunity, females who are pregnant should delay immunization until  after pregnancy.  Pneumococcal 13-valent conjugate (PCV13) vaccine.** / Consult your health care provider.  Pneumococcal  polysaccharide (PPSV23) vaccine.** / 1 to 2 doses if you smoke cigarettes or if you have certain conditions.  Meningococcal vaccine.** / 1 dose if you are age 78 to 84 years and a Market researcher living in a residence hall, or have one of several medical conditions, you need to get vaccinated against meningococcal disease. You may also need additional booster doses.  Hepatitis A vaccine.** / Consult your health care provider.  Hepatitis B vaccine.** / Consult your health care provider.  Haemophilus influenzae type b (Hib) vaccine.** / Consult your health care provider. Ages 74 to 55 years  Blood pressure check.** / Every 1 to 2 years.  Lipid and cholesterol check.** / Every 5 years beginning at age 77 years.  Lung cancer screening. / Every year if you are aged 69-80 years and have a 30-pack-year history of smoking and currently smoke or have quit within the past 15 years. Yearly screening is stopped once you have quit smoking for at least 15 years or develop a health problem that would prevent you from having lung cancer treatment.  Clinical breast exam.** / Every year after age 99 years.  BRCA-related cancer risk assessment.** / For women who have family members with a BRCA-related cancer (breast, ovarian, tubal, or peritoneal cancers).  Mammogram.** / Every year beginning at age 76 years and continuing for as long as you are in good health. Consult with your health care provider.  Pap test.** / Every 3 years starting at age 71 years through age 33 or 41 years with a history of 3 consecutive normal Pap tests.  HPV screening.** / Every 3 years from ages 5 years through ages 69 to 5 years with a history of 3 consecutive normal Pap tests.  Fecal occult blood test (FOBT) of stool. / Every year beginning at age 47 years and continuing until age 9 years. You may not need to do this test if you get a colonoscopy every 10 years.  Flexible sigmoidoscopy or colonoscopy.** / Every 5  years for a flexible sigmoidoscopy or every 10 years for a colonoscopy beginning at age 37 years and continuing until age 44 years.  Hepatitis C blood test.** / For all people born from [birth year] through 1964 and any individual with known risks for hepatitis C.  Skin self-exam. / Monthly.  Influenza vaccine. / Every year.  Tetanus, diphtheria, and acellular pertussis (Tdap/Td) vaccine.** / Consult your health care provider. Pregnant women should receive 1 dose of Tdap vaccine during each pregnancy. 1 dose of Td every 10 years.  Varicella vaccine.** / Consult your health care provider. Pregnant females who do not have evidence of immunity should receive the first dose after pregnancy.  Zoster vaccine.** / 1 dose for adults aged 78 years or older.  Measles, mumps, rubella (MMR) vaccine.** / You need at least 1 dose of MMR if you were born in 1957 or later. You may also need a 2nd dose. For females of childbearing age, rubella immunity should be determined. If there is no evidence of immunity, females who are not pregnant should be vaccinated. If there is no evidence of immunity, females who are pregnant should delay immunization until after pregnancy.  Pneumococcal 13-valent conjugate (PCV13) vaccine.** / Consult your health care provider.  Pneumococcal polysaccharide (PPSV23) vaccine.** / 1 to 2 doses if you smoke cigarettes or if you have certain conditions.  Meningococcal vaccine.** / Consult your  health care provider.  Hepatitis A vaccine.** / Consult your health care provider.  Hepatitis B vaccine.** / Consult your health care provider.  Haemophilus influenzae type b (Hib) vaccine.** / Consult your health care provider. Ages 63 years and over  Blood pressure check.** / Every 1 to 2 years.  Lipid and cholesterol check.** / Every 5 years beginning at age 81 years.  Lung cancer screening. / Every year if you are aged 47-80 years and have a 30-pack-year history of smoking and currently  smoke or have quit within the past 15 years. Yearly screening is stopped once you have quit smoking for at least 15 years or develop a health problem that would prevent you from having lung cancer treatment.  Clinical breast exam.** / Every year after age 42 years.  BRCA-related cancer risk assessment.** / For women who have family members with a BRCA-related cancer (breast, ovarian, tubal, or peritoneal cancers).  Mammogram.** / Every year beginning at age 52 years and continuing for as long as you are in good health. Consult with your health care provider.  Pap test.** / Every 3 years starting at age 101 years through age 46 or 16 years with 3 consecutive normal Pap tests. Testing can be stopped between 65 and 70 years with 3 consecutive normal Pap tests and no abnormal Pap or HPV tests in the past 10 years.  HPV screening.** / Every 3 years from ages 83 years through ages 78 or 59 years with a history of 3 consecutive normal Pap tests. Testing can be stopped between 65 and 70 years with 3 consecutive normal Pap tests and no abnormal Pap or HPV tests in the past 10 years.  Fecal occult blood test (FOBT) of stool. / Every year beginning at age 57 years and continuing until age 66 years. You may not need to do this test if you get a colonoscopy every 10 years.  Flexible sigmoidoscopy or colonoscopy.** / Every 5 years for a flexible sigmoidoscopy or every 10 years for a colonoscopy beginning at age 7 years and continuing until age 28 years.  Hepatitis C blood test.** / For all people born from [birth year] through 1964 and any individual with known risks for hepatitis C.  Osteoporosis screening.** / A one-time screening for women ages 36 years and over and women at risk for fractures or osteoporosis.  Skin self-exam. / Monthly.  Influenza vaccine. / Every year.  Tetanus, diphtheria, and acellular pertussis (Tdap/Td) vaccine.** / 1 dose of Td every 10 years.  Varicella vaccine.** / Consult your  health care provider.  Zoster vaccine.** / 1 dose for adults aged 96 years or older.  Pneumococcal 13-valent conjugate (PCV13) vaccine.** / Consult your health care provider.  Pneumococcal polysaccharide (PPSV23) vaccine.** / 1 dose for all adults aged 27 years and older.  Meningococcal vaccine.** / Consult your health care provider.  Hepatitis A vaccine.** / Consult your health care provider.  Hepatitis B vaccine.** / Consult your health care provider.  Haemophilus influenzae type b (Hib) vaccine.** / Consult your health care provider. ** Family history and personal history of risk and conditions may change your health care provider's recommendations. Document Released: 10/19/2001 Document Revised: 01/07/2014 Document Reviewed: 01/18/2011 [facility] Patient Information 2015 [place], [place]. This information is not intended to replace advice given to you by your health care provider. Make sure you discuss any questions you have with your health care provider.

## 2015-06-09 NOTE — Progress Notes (Signed)
Pre visit review using our clinic review tool, if applicable. No additional management support is needed unless otherwise documented below in the visit note/SLS  

## 2015-06-09 NOTE — Assessment & Plan Note (Signed)
Depression screen negative. Health Maintenance reviewed -- up-to-date on all parameters. Preventive schedule discussed and handout given in AVS. Will obtain fasting labs today.

## 2015-06-09 NOTE — Progress Notes (Signed)
Patient presents to clinic today for annual exam.  Patient is fasting for labs.  Acute Concerns: Patient complains of some return of hot flashes after GYN decreased her Ogen to half the original dose. Is having daytime and nighttime symptoms. Is taking the Ogen as directed. Has not tried any OTC supplements.  Chronic Issues: Hypothyroidism -- endorses taking levothyroxine as directed. Does note some mild fatigue but no other symptoms.  Hyperlipidemia -- Taking Mevacor daily. Denies myalgias.  Health Maintenance: Dental -- up-to-date Vision -- up-to-date Immunizations -- Flu shot on 05/22/15. Tetanus up-to-date. Colonoscopy -- 07/16/2013 Mammogram -- 03/21/2015 PAP -- Followed by GYN.   Past Medical History  Diagnosis Date  . Allergic rhinitis   . Hypercholesteremia   . Hypothyroidism   . Hx of colonic polyps   . Unspecified menopausal and postmenopausal disorder   . Diffuse cystic mastopathy   . Left knee injury   . Osteopenia   . Vitamin D deficiency   . Multiple lipomas   . Asthma     Past Surgical History  Procedure Laterality Date  . Tonsillectomy and adenoidectomy      at age 78  . Wisdom tooth extraction      as a teen  . Benign right breast biopsy  2002    Current Outpatient Prescriptions on File Prior to Visit  Medication Sig Dispense Refill  . Cholecalciferol (VITAMIN D-3) 1000 UNITS CAPS Take 1 capsule by mouth daily.    Marland Kitchen estropipate (OGEN) 0.75 MG tablet Take 0.5 tablet    . levothyroxine (SYNTHROID, LEVOTHROID) 100 MCG tablet TAKE 1 TABLET BY MOUTH EVERY DAY 90 tablet 1  . loratadine-pseudoephedrine (CLARITIN-D 24 HOUR) 10-240 MG per 24 hr tablet Take 1 tablet by mouth daily. 60 tablet 0  . lovastatin (MEVACOR) 20 MG tablet Take 1 tablet (20 mg total) by mouth at bedtime. 90 tablet 3  . medroxyPROGESTERone (PROVERA) 2.5 MG tablet Take 2.5 mg by mouth daily.    . Multiple Vitamin (MULTIVITAMIN) tablet Take 1 tablet by mouth daily.     No current  facility-administered medications on file prior to visit.    Allergies  Allergen Reactions  . Bee Venom Anaphylaxis  . Adhesive [Tape] Other (See Comments)  . Sulfamethoxazole-Trimethoprim Hives and Itching    Bactrim=REACTION: hives    Family History  Problem Relation Age of Onset  . Colon polyps Father 36  . Breast cancer Mother 36    Deceased-61  . Colon cancer Neg Hx   . Hypertension Father   . Alzheimer's disease Father     Deceased-95  . Alzheimer's disease Paternal Aunt   . Throat cancer Paternal Uncle   . Lung cancer Paternal Uncle   . Prostate cancer Paternal Uncle   . Cancer Maternal Uncle   . Healthy Daughter     Social History   Social History  . Marital Status: Married    Spouse Name: james x 40 + yrs  . Number of Children: 1  . Years of Education: N/A   Occupational History  . Not on file.   Social History Main Topics  . Smoking status: Never Smoker   . Smokeless tobacco: Never Used  . Alcohol Use: Yes     Comment: social use  . Drug Use: No  . Sexual Activity: Not on file   Other Topics Concern  . Not on file   Social History Narrative    Review of Systems  Constitutional: Positive for malaise/fatigue. Negative for fever  and weight loss.  HENT: Negative for ear discharge, ear pain, hearing loss and tinnitus.   Eyes: Negative for blurred vision, double vision, photophobia and pain.  Respiratory: Negative for cough and shortness of breath.   Cardiovascular: Negative for chest pain and palpitations.  Gastrointestinal: Negative for heartburn, nausea, vomiting, abdominal pain, diarrhea, constipation, blood in stool and melena.  Genitourinary: Negative for dysuria, urgency, frequency, hematuria and flank pain.  Musculoskeletal: Negative for falls.  Neurological: Negative for dizziness, loss of consciousness and headaches.  Endo/Heme/Allergies: Negative for environmental allergies.  Psychiatric/Behavioral: Negative for depression, suicidal  ideas, hallucinations and substance abuse. The patient is not nervous/anxious and does not have insomnia.     BP 125/76 mmHg  Pulse 82  Temp(Src) 98.5 F (36.9 C) (Oral)  Resp 16  Ht 5\' 4"  (1.626 m)  Wt 186 lb 8 oz (84.596 kg)  BMI 32.00 kg/m2  SpO2 97%  Physical Exam  Constitutional: She is oriented to person, place, and time and well-developed, well-nourished, and in no distress.  HENT:  Head: Normocephalic and atraumatic.  Right Ear: Tympanic membrane, external ear and ear canal normal.  Left Ear: Tympanic membrane, external ear and ear canal normal.  Nose: Nose normal. No mucosal edema.  Mouth/Throat: Uvula is midline, oropharynx is clear and moist and mucous membranes are normal. No oropharyngeal exudate or posterior oropharyngeal erythema.  Eyes: Conjunctivae are normal. Pupils are equal, round, and reactive to light.  Neck: Neck supple. No thyromegaly present.  Cardiovascular: Normal rate, regular rhythm, normal heart sounds and intact distal pulses.   Pulmonary/Chest: Effort normal and breath sounds normal. No respiratory distress. She has no wheezes. She has no rales.  Abdominal: Soft. Bowel sounds are normal. She exhibits no distension and no mass. There is no tenderness. There is no rebound and no guarding.  Lymphadenopathy:    She has no cervical adenopathy.  Neurological: She is alert and oriented to person, place, and time. No cranial nerve deficit.  Skin: Skin is warm and dry. No rash noted.  Psychiatric: Affect normal.  Vitals reviewed.   No results found for this or any previous visit (from the past 2160 hour(s)).  Assessment/Plan: Visit for preventive health examination Depression screen negative. Health Maintenance reviewed -- up-to-date on all parameters. Preventive schedule discussed and handout given in AVS. Will obtain fasting labs today.    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  Hyperlipidemia Tolerating statin. Will reassess  myalgias  Menopausal and postmenopausal disorder Recommended Black Cohosh supplement or I-Cool. Since GYN is managing, follow-up with specialty if symptoms are not improving.

## 2015-06-09 NOTE — Assessment & Plan Note (Signed)
Will check TSH today 

## 2015-06-09 NOTE — Assessment & Plan Note (Signed)
Recommended Black Cohosh supplement or I-Cool. Since GYN is managing, follow-up with specialty if symptoms are not improving.

## 2015-06-09 NOTE — Assessment & Plan Note (Signed)
Tolerating statin. Will reassess myalgias

## 2015-07-29 ENCOUNTER — Other Ambulatory Visit: Payer: Self-pay | Admitting: Physician Assistant

## 2015-09-15 ENCOUNTER — Encounter (HOSPITAL_BASED_OUTPATIENT_CLINIC_OR_DEPARTMENT_OTHER): Payer: Self-pay | Admitting: *Deleted

## 2015-09-15 ENCOUNTER — Emergency Department (HOSPITAL_BASED_OUTPATIENT_CLINIC_OR_DEPARTMENT_OTHER): Payer: BLUE CROSS/BLUE SHIELD

## 2015-09-15 ENCOUNTER — Emergency Department (HOSPITAL_BASED_OUTPATIENT_CLINIC_OR_DEPARTMENT_OTHER)
Admission: EM | Admit: 2015-09-15 | Discharge: 2015-09-15 | Disposition: A | Discharge status: Home / Self Care | Payer: BLUE CROSS/BLUE SHIELD | Attending: Emergency Medicine | Admitting: Emergency Medicine

## 2015-09-15 DIAGNOSIS — W001XXA Fall from stairs and steps due to ice and snow, initial encounter: Secondary | ICD-10-CM | POA: Insufficient documentation

## 2015-09-15 DIAGNOSIS — E039 Hypothyroidism, unspecified: Secondary | ICD-10-CM | POA: Diagnosis not present

## 2015-09-15 DIAGNOSIS — Z8742 Personal history of other diseases of the female genital tract: Secondary | ICD-10-CM | POA: Insufficient documentation

## 2015-09-15 DIAGNOSIS — Y9389 Activity, other specified: Secondary | ICD-10-CM | POA: Insufficient documentation

## 2015-09-15 DIAGNOSIS — J45909 Unspecified asthma, uncomplicated: Secondary | ICD-10-CM | POA: Insufficient documentation

## 2015-09-15 DIAGNOSIS — Y998 Other external cause status: Secondary | ICD-10-CM | POA: Insufficient documentation

## 2015-09-15 DIAGNOSIS — S99912A Unspecified injury of left ankle, initial encounter: Secondary | ICD-10-CM | POA: Diagnosis present

## 2015-09-15 DIAGNOSIS — E7800 Pure hypercholesterolemia, unspecified: Secondary | ICD-10-CM | POA: Diagnosis not present

## 2015-09-15 DIAGNOSIS — Z86010 Personal history of colonic polyps: Secondary | ICD-10-CM | POA: Insufficient documentation

## 2015-09-15 DIAGNOSIS — Y9289 Other specified places as the place of occurrence of the external cause: Secondary | ICD-10-CM | POA: Diagnosis not present

## 2015-09-15 DIAGNOSIS — M8580 Other specified disorders of bone density and structure, unspecified site: Secondary | ICD-10-CM | POA: Diagnosis not present

## 2015-09-15 DIAGNOSIS — S82435A Nondisplaced oblique fracture of shaft of left fibula, initial encounter for closed fracture: Secondary | ICD-10-CM | POA: Insufficient documentation

## 2015-09-15 DIAGNOSIS — S82892A Other fracture of left lower leg, initial encounter for closed fracture: Secondary | ICD-10-CM

## 2015-09-15 DIAGNOSIS — E559 Vitamin D deficiency, unspecified: Secondary | ICD-10-CM | POA: Insufficient documentation

## 2015-09-15 DIAGNOSIS — Z86018 Personal history of other benign neoplasm: Secondary | ICD-10-CM | POA: Diagnosis not present

## 2015-09-15 DIAGNOSIS — Z79899 Other long term (current) drug therapy: Secondary | ICD-10-CM | POA: Diagnosis not present

## 2015-09-15 DIAGNOSIS — Z793 Long term (current) use of hormonal contraceptives: Secondary | ICD-10-CM | POA: Diagnosis not present

## 2015-09-15 DIAGNOSIS — S9002XA Contusion of left ankle, initial encounter: Secondary | ICD-10-CM | POA: Diagnosis not present

## 2015-09-15 MED ORDER — IBUPROFEN 800 MG PO TABS
800.0000 mg | ORAL_TABLET | Freq: Once | ORAL | Status: AC
  Administered 2015-09-15: 800 mg via ORAL
  Filled 2015-09-15: qty 1

## 2015-09-15 MED ORDER — HYDROCODONE-ACETAMINOPHEN 5-325 MG PO TABS: 1.0000 | ORAL_TABLET | ORAL | Status: DC | PRN

## 2015-09-15 MED ORDER — IBUPROFEN 800 MG PO TABS: 800.0000 mg | ORAL_TABLET | Freq: Three times a day (TID) | ORAL | Status: DC

## 2015-09-15 NOTE — ED Notes (Signed)
MD at bedside. 

## 2015-09-15 NOTE — ED Provider Notes (Signed)
CSN: [id number]     Arrival date & time 09/15/15  1906 History   First MD Initiated Contact with Patient 09/15/15 1937     Chief Complaint  Patient presents with  . Ankle Injury     (Consider location/radiation/quality/duration/timing/severity/associated sxs/prior Treatment) HPI   Victoria Pacheco is a 65 y.o. female with PMH significant for allergic rhinitis, hypercholesterolemia, hypothyroidism, left knee injury, osteopenia, asthma who presents with left ankle pain after sustaining a fall just PTA.  Patient reports she slipped down 2 stairs.  No LOC, head injury, or pain other than left ankle.  Aggravating factors include bearing weight.  Modifying factors include tylenol and ice.  Denies numbness, tingling, or weakness.   Past Medical History  Diagnosis Date  . Allergic rhinitis   . Hypercholesteremia   . Hypothyroidism   . Hx of colonic polyps   . Unspecified menopausal and postmenopausal disorder   . Diffuse cystic mastopathy   . Left knee injury   . Osteopenia   . Vitamin D deficiency   . Multiple lipomas   . Asthma    Past Surgical History  Procedure Laterality Date  . Tonsillectomy and adenoidectomy      at age 32  . Wisdom tooth extraction      as a teen  . Benign right breast biopsy  2002   Family History  Problem Relation Age of Onset  . Colon polyps Father 61  . Breast cancer Mother 10    Deceased-61  . Colon cancer Neg Hx   . Hypertension Father   . Alzheimer's disease Father     Deceased-95  . Alzheimer's disease Paternal Aunt   . Throat cancer Paternal Uncle   . Lung cancer Paternal Uncle   . Prostate cancer Paternal Uncle   . Cancer Maternal Uncle   . Healthy Daughter    Social History  Substance Use Topics  . Smoking status: Never Smoker   . Smokeless tobacco: Never Used  . Alcohol Use: Yes     Comment: social use   OB History    No data available     Review of Systems All other systems negative unless otherwise stated in HPI    Allergies   Bee venom; Adhesive; and Sulfamethoxazole-trimethoprim  Home Medications   Prior to Admission medications   Medication Sig Start Date End Date Taking? Authorizing Provider  Cholecalciferol (VITAMIN D-3) 1000 UNITS CAPS Take 1 capsule by mouth daily.   Yes Historical Provider, MD  estropipate (OGEN) 0.75 MG tablet Take 0.5 tablet   Yes Historical Provider, MD  levothyroxine (SYNTHROID, LEVOTHROID) 100 MCG tablet TAKE 1 TABLET BY MOUTH EVERY DAY 07/29/15  Yes Brunetta Jeans, PA-C  loratadine-pseudoephedrine (CLARITIN-D 24 HOUR) 10-240 MG per 24 hr tablet Take 1 tablet by mouth daily. 01/06/15  Yes Brunetta Jeans, PA-C  lovastatin (MEVACOR) 20 MG tablet Take 1 tablet (20 mg total) by mouth at bedtime. 01/06/15  Yes Brunetta Jeans, PA-C  medroxyPROGESTERone (PROVERA) 2.5 MG tablet Take 2.5 mg by mouth daily.   Yes Historical Provider, MD  Multiple Vitamin (MULTIVITAMIN) tablet Take 1 tablet by mouth daily.   Yes Historical Provider, MD  HYDROcodone-acetaminophen (NORCO/VICODIN) 5-325 MG tablet Take 1 tablet by mouth every 4 (four) hours as needed. 09/15/15   Gloriann Loan, PA-C  ibuprofen (ADVIL,MOTRIN) 800 MG tablet Take 1 tablet (800 mg total) by mouth 3 (three) times daily. 09/15/15   Gloriann Loan, PA-C  Multiple Vitamins-Minerals (MULTIVITAMIN ADULTS 50+) TABS Take by mouth  daily.    Historical Provider, MD   BP 139/92 mmHg  Pulse 82  Temp(Src) 98.3 F (36.8 C) (Oral)  Resp 20  Ht 5\' 5"  (1.651 m)  Wt 81.647 kg  BMI 29.95 kg/m2  SpO2 98% Physical Exam  Constitutional: She is oriented to person, place, and time. She appears well-developed and well-nourished.  HENT:  Head: Atraumatic.  Eyes: Conjunctivae are normal. No scleral icterus.  Neck: No tracheal deviation present.  Cardiovascular:  Capillary refill less than 3 seconds in lower extremities.   Pulmonary/Chest: Effort normal. No respiratory distress.  Musculoskeletal: She exhibits tenderness.       Right ankle: Normal.       Left  ankle: She exhibits swelling and ecchymosis. She exhibits no deformity, no laceration and normal pulse. Tenderness.       Feet:  Compartment soft and compressible.   Neurological: She is alert and oriented to person, place, and time.  Strength and sensation intact bilaterally and distal to injury.   Skin: Skin is warm and dry.  Psychiatric: She has a normal mood and affect. Her behavior is normal.    ED Course  Procedures (including critical care time) Labs Review Labs Reviewed - No data to display  Imaging Review Dg Ankle Complete Left  09/15/2015  CLINICAL DATA:  65 year old female with history of trauma from a fall on the ice tonight complaining of pain in the left ankle. EXAM: LEFT ANKLE COMPLETE - 3+ VIEW COMPARISON:  No priors. FINDINGS: Three views of the left ankle demonstrate an acute oblique fracture through the distal fibular diaphysis, which is nondisplaced, immediately proximal to the lateral malleolus. Remainder of the bones otherwise appear intact. Tibiotalar joint effusion. Small plantar calcaneal enthesophyte incidentally noted. IMPRESSION: 1. Nondisplaced oblique fracture through the distal third of the fibular diaphysis just proximal to the lateral malleolus. 2. Tibiotalar joint effusion. Electronically Signed   By: Vinnie Langton M.D.   On: 09/15/2015 20:10   I have personally reviewed and evaluated these images and lab results as part of my medical decision-making.   EKG Interpretation None      MDM   Final diagnoses:  Ankle fracture, left, closed, initial encounter    Patient presents with left ankle pain after slipping on ice earlier today.  VSS, NAD.  No numbness or weakness.  She is NVI distal to injury.  Area of swelling and ecchymosis noted on lateral ankle.  Plain films remarkable for nondisplaced oblique fx through distal third of fibular diaphysis just proximal to lateral malleolus.  Tibiotalar joint effusion.  Will place in cam walker and crutches.   Patient instructed to be NWB until seen by ortho in 7-10 days.  Motrin and Norco given for pain.  Discussed return precautions.  Patient agrees and acknowledges the above plan for discharge.    Gloriann Loan, PA-C 09/15/15 2115  Gloriann Loan, PA-C 09/15/15 2128  Veryl Speak, MD 09/15/15 2141

## 2015-09-15 NOTE — Discharge Instructions (Signed)
Follow up with orthopedics in 7-10 days for evaluation.  Do not bear weight on affected extremity.  Use your crutches for ambulation. You may take Motrin and Norco for pain.  Tibial and Fibular Fracture, Adult Tibial and fibular fracture is a break in the bones of your lower leg (tibia and fibula). The tibia is the larger of these two bones. The fibula is the smaller of the two bones. It is on the outer side of your leg.  CAUSES  Low-energy injuries, such as a fall from ground level.  High-energy injuries, such as motor vehicle injuries, gunshot wounds, or high-speed sports collisions. RISK FACTORS  Jumping activities.  Repetitive stress, such as long-distance running.  Participation in sports.  Osteoporosis.  Advanced age. SIGNS AND SYMPTOMS  Pain.  Swelling.  Inability to put weight on your injured leg.  Bone deformities at the site of your injury.  Bruising. DIAGNOSIS  Tibial and fibular fractures are diagnosed with the use of X-ray exams. TREATMENT  If you have a simple fracture of these two bones, they can be treated with simple immobilization. A cast or splint will be used on your leg to keep it from moving while it heals. Then you can begin range-of-motion exercises to regain your knee motion. HOME CARE INSTRUCTIONS   Apply ice to your leg:  Put ice in a plastic bag.  Place a towel between your skin and the bag.  Leave the ice on for 20 minutes, 2-3 times a day.  If you have a plaster or fiberglass cast:  Do not try to scratch the skin under the cast using sharp or pointed objects.  Check the skin around the cast every day. You may put lotion on any red or sore areas.  Keep your cast dry and clean.  If you have a plaster splint:  Wear the splint as directed.  You may loosen the elastic around the splint if your toes become numb, tingle, or turn cold or blue.  Do not put pressure on any part of your cast or splint until it is fully hardened, because it  may deform.  Your cast or splint can be protected during bathing with a plastic bag. Do not lower the cast or splint into water.  Use crutches as directed.  Only take over-the-counter or prescription medicines for pain, discomfort, or fever as directed by your health care provider.  Follow all instructions given to you by your health care provider.  Make and keep all follow-up appointments. SEEK MEDICAL CARE IF:  Your pain is becoming worse rather than better or is not controlled with medicines.  You have increased swelling or redness in the foot.  You begin to lose feeling in your foot or toes. SEEK IMMEDIATE MEDICAL CARE IF:  You develop a cold or blue foot or toes on the injured side.  You develop severe pain in your injured leg, especially if the pain is increased with movement of your toes. MAKE SURE YOU:  Understand these instructions.  Will watch your condition.  Will get help right away if you are not doing well or get worse.   This information is not intended to replace advice given to you by your health care provider. Make sure you discuss any questions you have with your health care provider.   Document Released: 05/15/2002 Document Revised: 01/07/2015 Document Reviewed: 04/04/2013 Elsevier Interactive Patient Education 2016 [employer].  Walking Boot A walking boot (controlled ankle motion boot or CAM walker) is a removable  boot-shaped splint that holds your foot or ankle in place after an injury or a medical procedure. This helps with healing and prevents further injury. A walking boot has a stiff, rigid outer frame that limits movement and supports your leg and foot. The inner lining is a layer of padded material. Walking boots usually have several adjustable straps to secure them over the foot. Your health care provider may prescribe a walking boot if it is okay for you to use your injured foot to support your body weight. How much you can walk with the boot on  will depend on the type and severity of your injury. Your health care provider will recommend the best boot for you based on your condition. HOW DO I PUT ON MY WALKING BOOT? There are different types of walking boots. Each type of boot has specific instructions about how to wear it properly. Follow instructions from your health care provider about wearing yours. In general:  Sit down to put on your boot. This is more comfortable and it helps to prevent falls.  Open up the boot fully. Place your foot into the boot so that your heel rests against the back.  Your toes should be supported by the base of the boot, but they should not hang over the front.  Adjust the straps so the boot fits securely but is not too tight.  Do not bend the hard frame of the boot to get a good fit.  Ask someone to help you put on the boot, if needed. WHAT ARE SOME TIPS FOR WALKING WITH A WALKING BOOT?  Do not try to walk without wearing the boot unless your health care provider has approved.  Rest your injured leg as much as possible.  Use other assistive walking devices as told by your health care provider. These include crutches and canes.  On your other foot, wear a shoe with a heel that is close to the height of the boot.  Be very careful when walking on surfaces that are uneven or wet. HOW CAN I REDUCE SWELLING?  Rest your injured foot or leg as much as possible.  If directed, apply ice to the injured area:  Put ice in a plastic bag.  Place a towel between your skin and the bag.  Leave the ice on for 20 minutes, 2-3 times a day for two days or as told by your health care provider.  Keep your injured leg raised (elevated) above the level of your heart for 2-3 hours each day or as told by your health care provider.  If swelling gets worse, loosen the boot and rest and raise your foot.  Contact your health care provider if swelling does not get better or if it gets worse over time. WHAT SKIN CARE  PRACTICES SHOULD I FOLLOW?  Wear a long sock to protect your foot and leg from rubbing inside the boot.  Take off the boot one time per day to check the injured area.  Follow instructions from your health care provider about taking care of your incision or wound, if this applies.  Clean and wash the injured area as told by your health care provider.  Gently dry your foot and leg before putting the boot back on.  Contact your health care provider if a wound is getting worse or if your skin becomes red, painful, or irritated. ARE THERE ANY ACTIVITY RESTRICTIONS? Activity restrictions depend on the type and severity of your injury. Follow instructions from your  health care provider.  Bathe and shower as directed by your health care provider.  Do not do activities that could make your injury worse.  Do not drive if your affected foot is one that you usually use for driving. HOW SHOULD I KEEP MY BOOT CLEAN?  Clean the frame and the liner of the boot by hand. Use a washcloth with mild soap and water.  Do not use chemical cleaning products. These could irritate your skin, especially if you have a wound or an incision.  Do not soak the liner of the boot.  Do not put any part of the boot in a washing machine or a clothes dryer.  Allow the boot to air dry completely before you put it back on your foot.   This information is not intended to replace advice given to you by your health care provider. Make sure you discuss any questions you have with your health care provider.   Document Released: 01/07/2015 Document Reviewed: 01/07/2015 Elsevier Interactive Patient Education [employer].

## 2015-09-15 NOTE — ED Notes (Signed)
Pt states she slipped down 2 steps on ice today, c/o pain in the left ankle. Last took Tylenol around 1800 and applied ice prior to arrival.

## 2016-07-21 ENCOUNTER — Other Ambulatory Visit: Payer: Self-pay | Admitting: Physician Assistant

## 2017-06-15 DIAGNOSIS — E038 Other specified hypothyroidism: Secondary | ICD-10-CM | POA: Diagnosis not present

## 2017-06-15 DIAGNOSIS — M859 Disorder of bone density and structure, unspecified: Secondary | ICD-10-CM | POA: Diagnosis not present

## 2017-06-22 DIAGNOSIS — E7849 Other hyperlipidemia: Secondary | ICD-10-CM | POA: Diagnosis not present

## 2017-06-22 DIAGNOSIS — E038 Other specified hypothyroidism: Secondary | ICD-10-CM | POA: Diagnosis not present

## 2017-06-22 DIAGNOSIS — K635 Polyp of colon: Secondary | ICD-10-CM | POA: Diagnosis not present

## 2017-06-22 DIAGNOSIS — M8580 Other specified disorders of bone density and structure, unspecified site: Secondary | ICD-10-CM | POA: Diagnosis not present

## 2017-06-22 DIAGNOSIS — Z0000 Encounter for general adult medical examination without abnormal findings: Secondary | ICD-10-CM | POA: Diagnosis not present

## 2017-06-22 DIAGNOSIS — Z6828 Body mass index (BMI) 28.0-28.9, adult: Secondary | ICD-10-CM | POA: Diagnosis not present

## 2017-06-22 DIAGNOSIS — E559 Vitamin D deficiency, unspecified: Secondary | ICD-10-CM | POA: Diagnosis not present

## 2017-06-22 DIAGNOSIS — Z23 Encounter for immunization: Secondary | ICD-10-CM | POA: Diagnosis not present

## 2017-06-22 DIAGNOSIS — J45909 Unspecified asthma, uncomplicated: Secondary | ICD-10-CM | POA: Diagnosis not present

## 2017-06-24 DIAGNOSIS — Z1212 Encounter for screening for malignant neoplasm of rectum: Secondary | ICD-10-CM | POA: Diagnosis not present

## 2017-08-22 DIAGNOSIS — N952 Postmenopausal atrophic vaginitis: Secondary | ICD-10-CM | POA: Diagnosis not present

## 2017-08-22 DIAGNOSIS — N3941 Urge incontinence: Secondary | ICD-10-CM | POA: Diagnosis not present

## 2017-08-22 DIAGNOSIS — N907 Vulvar cyst: Secondary | ICD-10-CM | POA: Diagnosis not present

## 2017-08-22 DIAGNOSIS — Z01419 Encounter for gynecological examination (general) (routine) without abnormal findings: Secondary | ICD-10-CM | POA: Diagnosis not present

## 2017-08-22 DIAGNOSIS — N905 Atrophy of vulva: Secondary | ICD-10-CM | POA: Diagnosis not present

## 2017-08-25 DIAGNOSIS — Z1231 Encounter for screening mammogram for malignant neoplasm of breast: Secondary | ICD-10-CM | POA: Diagnosis not present

## 2017-08-25 DIAGNOSIS — R928 Other abnormal and inconclusive findings on diagnostic imaging of breast: Secondary | ICD-10-CM | POA: Diagnosis not present

## 2017-08-31 ENCOUNTER — Other Ambulatory Visit: Payer: Self-pay | Admitting: Obstetrics and Gynecology

## 2017-08-31 DIAGNOSIS — N6310 Unspecified lump in the right breast, unspecified quadrant: Secondary | ICD-10-CM

## 2017-08-31 DIAGNOSIS — R921 Mammographic calcification found on diagnostic imaging of breast: Secondary | ICD-10-CM | POA: Diagnosis not present

## 2017-08-31 DIAGNOSIS — R922 Inconclusive mammogram: Secondary | ICD-10-CM | POA: Diagnosis not present

## 2017-08-31 DIAGNOSIS — R928 Other abnormal and inconclusive findings on diagnostic imaging of breast: Secondary | ICD-10-CM | POA: Diagnosis not present

## 2017-08-31 DIAGNOSIS — N6311 Unspecified lump in the right breast, upper outer quadrant: Secondary | ICD-10-CM | POA: Diagnosis not present

## 2017-09-05 ENCOUNTER — Ambulatory Visit
Admission: RE | Admit: 2017-09-05 | Discharge: 2017-09-05 | Disposition: A | Discharge status: Home / Self Care | Payer: Medicare Other | Source: Ambulatory Visit | Attending: Obstetrics and Gynecology | Admitting: Obstetrics and Gynecology

## 2017-09-05 DIAGNOSIS — N6310 Unspecified lump in the right breast, unspecified quadrant: Secondary | ICD-10-CM

## 2017-09-05 DIAGNOSIS — C50511 Malignant neoplasm of lower-outer quadrant of right female breast: Secondary | ICD-10-CM | POA: Diagnosis not present

## 2017-09-05 DIAGNOSIS — N6313 Unspecified lump in the right breast, lower outer quadrant: Secondary | ICD-10-CM | POA: Diagnosis not present

## 2017-09-08 ENCOUNTER — Other Ambulatory Visit: Payer: Self-pay | Admitting: Obstetrics and Gynecology

## 2017-09-08 DIAGNOSIS — R921 Mammographic calcification found on diagnostic imaging of breast: Secondary | ICD-10-CM

## 2017-09-13 DIAGNOSIS — C50511 Malignant neoplasm of lower-outer quadrant of right female breast: Secondary | ICD-10-CM | POA: Diagnosis not present

## 2017-09-14 ENCOUNTER — Other Ambulatory Visit: Payer: Self-pay | Admitting: Obstetrics and Gynecology

## 2017-09-14 ENCOUNTER — Ambulatory Visit
Admission: RE | Admit: 2017-09-14 | Discharge: 2017-09-14 | Disposition: A | Discharge status: Home / Self Care | Payer: Medicare Other | Source: Ambulatory Visit | Attending: Obstetrics and Gynecology | Admitting: Obstetrics and Gynecology

## 2017-09-14 DIAGNOSIS — R921 Mammographic calcification found on diagnostic imaging of breast: Secondary | ICD-10-CM

## 2017-09-14 DIAGNOSIS — N6312 Unspecified lump in the right breast, upper inner quadrant: Secondary | ICD-10-CM | POA: Diagnosis not present

## 2017-09-14 DIAGNOSIS — N6321 Unspecified lump in the left breast, upper outer quadrant: Secondary | ICD-10-CM | POA: Diagnosis not present

## 2017-09-14 DIAGNOSIS — N6011 Diffuse cystic mastopathy of right breast: Secondary | ICD-10-CM | POA: Diagnosis not present

## 2017-09-15 ENCOUNTER — Other Ambulatory Visit: Payer: Self-pay | Admitting: General Surgery

## 2017-09-15 DIAGNOSIS — Z170 Estrogen receptor positive status [ER+]: Principal | ICD-10-CM

## 2017-09-15 DIAGNOSIS — C50411 Malignant neoplasm of upper-outer quadrant of right female breast: Secondary | ICD-10-CM

## 2017-09-28 ENCOUNTER — Other Ambulatory Visit: Payer: Self-pay | Admitting: General Surgery

## 2017-09-28 DIAGNOSIS — Z170 Estrogen receptor positive status [ER+]: Principal | ICD-10-CM

## 2017-09-28 DIAGNOSIS — C50411 Malignant neoplasm of upper-outer quadrant of right female breast: Secondary | ICD-10-CM

## 2017-10-03 DIAGNOSIS — Z170 Estrogen receptor positive status [ER+]: Secondary | ICD-10-CM | POA: Diagnosis not present

## 2017-10-03 DIAGNOSIS — C50511 Malignant neoplasm of lower-outer quadrant of right female breast: Secondary | ICD-10-CM | POA: Diagnosis not present

## 2017-10-04 ENCOUNTER — Encounter (HOSPITAL_COMMUNITY): Payer: Self-pay

## 2017-10-04 NOTE — Pre-Procedure Instructions (Signed)
Victoria Pacheco  10/04/2017      [id number] [place], [place] - [address]OSS [address] [place] [address] Phone: [phone] Fax: [place], [place] [address] Phone: [phone] Fax: [phone]    Your procedure is scheduled on October 10, 2017.  Report to [facility] Admitting at 1030 AM.  Call this number if you have problems the morning of surgery:  [phone]   Remember:  Do not eat food or drink liquids after midnight.  Take these medicines the morning of surgery with A SIP OF WATER acetaminophen (tylenol), fluticasone (flonase) nasal spray-if needed, levothyroxine (synthroid), loratadine (claritin), ocean nasal spray-if needed  7 days prior to surgery STOP taking any Aspirin (unless otherwise instructed by your surgeon), Aleve, Naproxen, Ibuprofen, Motrin, Advil, Goody's, BC's, all herbal medications, fish oil, and all vitamins  Continue all other medications as instructed by your physician except follow the above medication instructions before surgery   Do not wear jewelry, make-up or nail polish.  Do not wear lotions, powders, or perfumes, or deodorant.  Do not shave 48 hours prior to surgery.    Do not bring valuables to the hospital.  [facility] is not responsible for any belongings or valuables.  Contacts, dentures or bridgework may not be worn into surgery.  Leave your suitcase in the car.  After surgery it may be brought to your room.  For patients admitted to the hospital, discharge time will be determined by your treatment team.  Patients discharged the day of surgery will not be allowed to drive home.   Special instructions:   [place]- Preparing For Surgery  Before surgery, you can play an important role. Because skin is not sterile, your skin needs to be as free of germs as  possible. You can reduce the number of germs on your skin by washing with CHG (chlorahexidine gluconate) Soap before surgery.  CHG is an antiseptic cleaner which kills germs and bonds with the skin to continue killing germs even after washing.  Please do not use if you have an allergy to CHG or antibacterial soaps. If your skin becomes reddened/irritated stop using the CHG.  Do not shave (including legs and underarms) for at least 48 hours prior to first CHG shower. It is OK to shave your face.  Please follow these instructions carefully.   1. Shower the NIGHT BEFORE SURGERY and the MORNING OF SURGERY with CHG.   2. If you chose to wash your hair, wash your hair first as usual with your normal shampoo.  3. After you shampoo, rinse your hair and body thoroughly to remove the shampoo.  4. Use CHG as you would any other liquid soap. You can apply CHG directly to the skin and wash gently with a scrungie or a clean washcloth.   5. Apply the CHG Soap to your body ONLY FROM THE NECK DOWN.  Do not use on open wounds or open sores. Avoid contact with your eyes, ears, mouth and genitals (private parts). Wash Face and genitals (private parts)  with your normal soap.  6. Wash thoroughly, paying special attention to the area where your surgery will be performed.  7. Thoroughly rinse your body with warm water from the neck down.  8. DO NOT shower/wash with your normal soap after using and rinsing off the CHG  Soap.  9. Pat yourself dry with a CLEAN TOWEL.  10. Wear CLEAN PAJAMAS to bed the night before surgery, wear comfortable clothes the morning of surgery  11. Place CLEAN SHEETS on your bed the night of your first shower and DO NOT SLEEP WITH PETS.  Day of Surgery: Do not apply any deodorants/lotions. Please wear clean clothes to the hospital/surgery center.    Please read over the following fact sheets that you were given. Pain Booklet, Coughing and Deep Breathing and Surgical Site Infection  Prevention

## 2017-10-05 ENCOUNTER — Other Ambulatory Visit: Payer: Self-pay

## 2017-10-05 ENCOUNTER — Encounter (HOSPITAL_COMMUNITY): Payer: Self-pay

## 2017-10-05 ENCOUNTER — Encounter (HOSPITAL_COMMUNITY)
Admission: RE | Admit: 2017-10-05 | Discharge: 2017-10-05 | Disposition: A | Discharge status: Home / Self Care | Payer: Medicare Other | Source: Ambulatory Visit | Attending: General Surgery | Admitting: General Surgery

## 2017-10-05 DIAGNOSIS — Z01818 Encounter for other preprocedural examination: Secondary | ICD-10-CM | POA: Insufficient documentation

## 2017-10-05 DIAGNOSIS — Z170 Estrogen receptor positive status [ER+]: Secondary | ICD-10-CM | POA: Insufficient documentation

## 2017-10-05 DIAGNOSIS — C50411 Malignant neoplasm of upper-outer quadrant of right female breast: Secondary | ICD-10-CM | POA: Insufficient documentation

## 2017-10-05 HISTORY — DX: Headache: R51

## 2017-10-05 HISTORY — DX: Gastro-esophageal reflux disease without esophagitis: K21.9

## 2017-10-05 HISTORY — DX: Personal history of urinary calculi: Z87.442

## 2017-10-05 HISTORY — DX: Malignant (primary) neoplasm, unspecified: C80.1

## 2017-10-05 HISTORY — DX: Pneumonia, unspecified organism: J18.9

## 2017-10-05 HISTORY — DX: Headache, unspecified: R51.9

## 2017-10-05 LAB — CBC
HCT: 39.1 % (ref 36.0–46.0)
Hemoglobin: 12.7 g/dL (ref 12.0–15.0)
MCH: 31.2 pg (ref 26.0–34.0)
MCHC: 32.5 g/dL (ref 30.0–36.0)
MCV: 96.1 fL (ref 78.0–100.0)
PLATELETS: 221 10*3/uL (ref 150–400)
RBC: 4.07 MIL/uL (ref 3.87–5.11)
RDW: 12.6 % (ref 11.5–15.5)
WBC: 5.7 10*3/uL (ref 4.0–10.5)

## 2017-10-05 NOTE — Progress Notes (Signed)
No report of needed cardiac care, pt. Reports that her PCP is Dr. Chauncey Cruel. Forde Dandy. Pt. Denies any chest concerns. Pt. Given specific instruction on ERAS protocol; handwritten on pre-surgery instructions, pt. Verbalizes understanding.

## 2017-10-05 NOTE — Pre-Procedure Instructions (Signed)
Victoria Pacheco  10/05/2017      [id number] - [place], [place] - [address] AT [address] [place] [address] Phone: [phone] Fax: [place], [place] [address] Phone: [phone] Fax: [phone]    Your procedure is scheduled on October 10, 2017.  Report to [facility] Admitting at 1030 AM.  Call this number if you have problems the morning of surgery:  [phone]   Remember:  Do not eat food or drink liquids after midnight.  Take these medicines the morning of surgery with A SIP OF WATER acetaminophen (tylenol), fluticasone (flonase) nasal spray-if needed, levothyroxine (synthroid), loratadine (claritin), ocean nasal spray-if needed   IMPORTANT: Sunday evening consume 2 bottles of Ensure CLEAR.  Morning of surgery, (Monday), consume one bottle of ENSURE before leaving the house for the hospital.   7 days prior to surgery STOP taking any Aspirin (unless otherwise instructed by your surgeon), Aleve, Naproxen, Ibuprofen, Motrin, Advil, Goody's, BC's, all herbal medications, fish oil, and all vitamins  Continue all other medications as instructed by your physician except follow the above medication instructions before surgery   Do not wear jewelry, make-up or nail polish.  Do not wear lotions, powders, or perfumes, or deodorant.  Do not shave 48 hours prior to surgery.    Do not bring valuables to the hospital.  [facility] is not responsible for any belongings or valuables.  Contacts, dentures or bridgework may not be worn into surgery.  Leave your suitcase in the car.  After surgery it may be brought to your room.  For patients admitted to the hospital, discharge time will be determined by your treatment team.  Patients discharged the day of surgery will not be allowed to drive home.   Special  instructions:   [place]- Preparing For Surgery  Before surgery, you can play an important role. Because skin is not sterile, your skin needs to be as free of germs as possible. You can reduce the number of germs on your skin by washing with CHG (chlorahexidine gluconate) Soap before surgery.  CHG is an antiseptic cleaner which kills germs and bonds with the skin to continue killing germs even after washing.  Please do not use if you have an allergy to CHG or antibacterial soaps. If your skin becomes reddened/irritated stop using the CHG.  Do not shave (including legs and underarms) for at least 48 hours prior to first CHG shower. It is OK to shave your face.  Please follow these instructions carefully.   1. Shower the NIGHT BEFORE SURGERY and the MORNING OF SURGERY with CHG.   2. If you chose to wash your hair, wash your hair first as usual with your normal shampoo.  3. After you shampoo, rinse your hair and body thoroughly to remove the shampoo.  4. Use CHG as you would any other liquid soap. You can apply CHG directly to the skin and wash gently with a scrungie or a clean washcloth.   5. Apply the CHG Soap to your body ONLY FROM THE NECK DOWN.  Do not use on open wounds or open sores. Avoid contact with your eyes, ears, mouth and genitals (private parts). Wash Face and genitals (private parts)  with your normal soap.  6. Wash thoroughly, paying special attention to the area where your surgery will be performed.  7. Thoroughly rinse your body with warm water from the neck down.  8. DO NOT shower/wash with your normal soap after using and rinsing off the CHG Soap.  9. Pat yourself dry with a CLEAN TOWEL.  10. Wear CLEAN PAJAMAS to bed the night before surgery, wear comfortable clothes the morning of surgery  11. Place CLEAN SHEETS on your bed the night of your first shower and DO NOT SLEEP WITH PETS.  Day of Surgery: Do not apply any deodorants/lotions. Please wear clean clothes  to the hospital/surgery center.    Please read over the following fact sheets that you were given. Pain Booklet, Coughing and Deep Breathing and Surgical Site Infection Prevention

## 2017-10-06 DIAGNOSIS — C50511 Malignant neoplasm of lower-outer quadrant of right female breast: Secondary | ICD-10-CM | POA: Diagnosis not present

## 2017-10-06 DIAGNOSIS — Z170 Estrogen receptor positive status [ER+]: Secondary | ICD-10-CM | POA: Diagnosis not present

## 2017-10-07 ENCOUNTER — Ambulatory Visit
Admission: RE | Admit: 2017-10-07 | Discharge: 2017-10-07 | Disposition: A | Discharge status: Home / Self Care | Payer: Medicare Other | Source: Ambulatory Visit | Attending: General Surgery | Admitting: General Surgery

## 2017-10-07 DIAGNOSIS — C50411 Malignant neoplasm of upper-outer quadrant of right female breast: Secondary | ICD-10-CM

## 2017-10-07 DIAGNOSIS — Z170 Estrogen receptor positive status [ER+]: Principal | ICD-10-CM

## 2017-10-07 DIAGNOSIS — R928 Other abnormal and inconclusive findings on diagnostic imaging of breast: Secondary | ICD-10-CM | POA: Diagnosis not present

## 2017-10-10 ENCOUNTER — Encounter (HOSPITAL_COMMUNITY): Payer: Self-pay | Admitting: *Deleted

## 2017-10-10 ENCOUNTER — Ambulatory Visit (HOSPITAL_COMMUNITY)
Admission: RE | Admit: 2017-10-10 | Discharge: 2017-10-10 | Disposition: A | Discharge status: Home / Self Care | Payer: Medicare Other | Source: Ambulatory Visit | Attending: General Surgery | Admitting: General Surgery

## 2017-10-10 ENCOUNTER — Ambulatory Visit (HOSPITAL_COMMUNITY): Payer: Medicare Other | Admitting: Anesthesiology

## 2017-10-10 ENCOUNTER — Ambulatory Visit
Admission: RE | Admit: 2017-10-10 | Discharge: 2017-10-10 | Disposition: A | Discharge status: Home / Self Care | Payer: Medicare Other | Source: Ambulatory Visit | Attending: General Surgery | Admitting: General Surgery

## 2017-10-10 ENCOUNTER — Encounter (HOSPITAL_COMMUNITY)
Admission: RE | Disposition: A | Discharge status: Home / Self Care | Payer: Self-pay | Source: Ambulatory Visit | Attending: General Surgery

## 2017-10-10 ENCOUNTER — Other Ambulatory Visit: Payer: Self-pay

## 2017-10-10 DIAGNOSIS — E039 Hypothyroidism, unspecified: Secondary | ICD-10-CM | POA: Diagnosis not present

## 2017-10-10 DIAGNOSIS — C50911 Malignant neoplasm of unspecified site of right female breast: Secondary | ICD-10-CM | POA: Diagnosis not present

## 2017-10-10 DIAGNOSIS — Z882 Allergy status to sulfonamides status: Secondary | ICD-10-CM | POA: Diagnosis not present

## 2017-10-10 DIAGNOSIS — K635 Polyp of colon: Secondary | ICD-10-CM | POA: Diagnosis not present

## 2017-10-10 DIAGNOSIS — R928 Other abnormal and inconclusive findings on diagnostic imaging of breast: Secondary | ICD-10-CM | POA: Diagnosis not present

## 2017-10-10 DIAGNOSIS — N6021 Fibroadenosis of right breast: Secondary | ICD-10-CM | POA: Diagnosis not present

## 2017-10-10 DIAGNOSIS — C50411 Malignant neoplasm of upper-outer quadrant of right female breast: Secondary | ICD-10-CM

## 2017-10-10 DIAGNOSIS — Z170 Estrogen receptor positive status [ER+]: Principal | ICD-10-CM

## 2017-10-10 DIAGNOSIS — Z79899 Other long term (current) drug therapy: Secondary | ICD-10-CM | POA: Diagnosis not present

## 2017-10-10 DIAGNOSIS — C50511 Malignant neoplasm of lower-outer quadrant of right female breast: Secondary | ICD-10-CM | POA: Diagnosis not present

## 2017-10-10 DIAGNOSIS — E559 Vitamin D deficiency, unspecified: Secondary | ICD-10-CM | POA: Diagnosis not present

## 2017-10-10 DIAGNOSIS — K219 Gastro-esophageal reflux disease without esophagitis: Secondary | ICD-10-CM | POA: Diagnosis not present

## 2017-10-10 DIAGNOSIS — G8918 Other acute postprocedural pain: Secondary | ICD-10-CM | POA: Diagnosis not present

## 2017-10-10 DIAGNOSIS — N6011 Diffuse cystic mastopathy of right breast: Secondary | ICD-10-CM | POA: Diagnosis not present

## 2017-10-10 DIAGNOSIS — D0511 Intraductal carcinoma in situ of right breast: Secondary | ICD-10-CM | POA: Insufficient documentation

## 2017-10-10 HISTORY — PX: BREAST LUMPECTOMY WITH RADIOACTIVE SEED AND SENTINEL LYMPH NODE BIOPSY: SHX6550

## 2017-10-10 SURGERY — BREAST LUMPECTOMY WITH RADIOACTIVE SEED AND SENTINEL LYMPH NODE BIOPSY
Anesthesia: General | Site: Breast | Laterality: Right

## 2017-10-10 MED ORDER — OXYCODONE HCL 5 MG PO TABS: 5.0000 mg | ORAL_TABLET | Freq: Four times a day (QID) | ORAL | 0 refills | Status: DC | PRN

## 2017-10-10 MED ORDER — MIDAZOLAM HCL 2 MG/2ML IJ SOLN
1.0000 mg | Freq: Once | INTRAMUSCULAR | Status: AC
  Administered 2017-10-10: 1 mg via INTRAVENOUS

## 2017-10-10 MED ORDER — BUPIVACAINE-EPINEPHRINE (PF) 0.5% -1:200000 IJ SOLN
INTRAMUSCULAR | Status: DC | PRN
  Administered 2017-10-10: 30 mL

## 2017-10-10 MED ORDER — ONDANSETRON HCL 4 MG/2ML IJ SOLN
INTRAMUSCULAR | Status: DC | PRN
  Administered 2017-10-10: 4 mg via INTRAVENOUS

## 2017-10-10 MED ORDER — FENTANYL CITRATE (PF) 250 MCG/5ML IJ SOLN
INTRAMUSCULAR | Status: DC | PRN
  Administered 2017-10-10 (×3): 25 ug via INTRAVENOUS

## 2017-10-10 MED ORDER — PHENYLEPHRINE 40 MCG/ML (10ML) SYRINGE FOR IV PUSH (FOR BLOOD PRESSURE SUPPORT)
PREFILLED_SYRINGE | INTRAVENOUS | Status: AC
  Filled 2017-10-10: qty 20

## 2017-10-10 MED ORDER — LACTATED RINGERS IV SOLN
INTRAVENOUS | Status: DC
  Administered 2017-10-10: 12:00:00 via INTRAVENOUS

## 2017-10-10 MED ORDER — SODIUM CHLORIDE 0.9 % IJ SOLN
INTRAMUSCULAR | Status: AC
  Filled 2017-10-10: qty 10

## 2017-10-10 MED ORDER — GABAPENTIN 300 MG PO CAPS
300.0000 mg | ORAL_CAPSULE | ORAL | Status: AC
  Administered 2017-10-10: 300 mg via ORAL
  Filled 2017-10-10: qty 1

## 2017-10-10 MED ORDER — EPHEDRINE 5 MG/ML INJ
INTRAVENOUS | Status: AC
  Filled 2017-10-10: qty 10

## 2017-10-10 MED ORDER — LIDOCAINE HCL (CARDIAC) 20 MG/ML IV SOLN
INTRAVENOUS | Status: DC | PRN
  Administered 2017-10-10: 20 mg via INTRATRACHEAL

## 2017-10-10 MED ORDER — PHENYLEPHRINE 40 MCG/ML (10ML) SYRINGE FOR IV PUSH (FOR BLOOD PRESSURE SUPPORT)
PREFILLED_SYRINGE | INTRAVENOUS | Status: AC
  Filled 2017-10-10: qty 10

## 2017-10-10 MED ORDER — DEXAMETHASONE SODIUM PHOSPHATE 10 MG/ML IJ SOLN
INTRAMUSCULAR | Status: AC
  Filled 2017-10-10: qty 1

## 2017-10-10 MED ORDER — KETOROLAC TROMETHAMINE 15 MG/ML IJ SOLN
15.0000 mg | INTRAMUSCULAR | Status: DC
  Administered 2017-10-10: 15 mg via INTRAVENOUS
  Filled 2017-10-10: qty 1

## 2017-10-10 MED ORDER — MIDAZOLAM HCL 2 MG/2ML IJ SOLN
INTRAMUSCULAR | Status: AC
  Administered 2017-10-10: 1 mg via INTRAVENOUS
  Filled 2017-10-10: qty 2

## 2017-10-10 MED ORDER — ACETAMINOPHEN 500 MG PO TABS
1000.0000 mg | ORAL_TABLET | ORAL | Status: DC
  Filled 2017-10-10: qty 2

## 2017-10-10 MED ORDER — BUPIVACAINE-EPINEPHRINE (PF) 0.5% -1:200000 IJ SOLN
INTRAMUSCULAR | Status: AC
  Filled 2017-10-10: qty 30

## 2017-10-10 MED ORDER — BUPIVACAINE-EPINEPHRINE 0.5% -1:200000 IJ SOLN
INTRAMUSCULAR | Status: DC | PRN
  Administered 2017-10-10: 5 mL

## 2017-10-10 MED ORDER — PROPOFOL 10 MG/ML IV BOLUS
INTRAVENOUS | Status: AC
  Filled 2017-10-10: qty 20

## 2017-10-10 MED ORDER — DEXAMETHASONE SODIUM PHOSPHATE 10 MG/ML IJ SOLN
INTRAMUSCULAR | Status: DC | PRN
  Administered 2017-10-10: 10 mg via INTRAVENOUS

## 2017-10-10 MED ORDER — PHENYLEPHRINE 40 MCG/ML (10ML) SYRINGE FOR IV PUSH (FOR BLOOD PRESSURE SUPPORT)
PREFILLED_SYRINGE | INTRAVENOUS | Status: DC | PRN
  Administered 2017-10-10 (×2): 80 ug via INTRAVENOUS
  Administered 2017-10-10: 40 ug via INTRAVENOUS
  Administered 2017-10-10 (×2): 80 ug via INTRAVENOUS
  Administered 2017-10-10: 40 ug via INTRAVENOUS
  Administered 2017-10-10: 80 ug via INTRAVENOUS

## 2017-10-10 MED ORDER — FENTANYL CITRATE (PF) 250 MCG/5ML IJ SOLN
INTRAMUSCULAR | Status: AC
  Filled 2017-10-10: qty 5

## 2017-10-10 MED ORDER — FENTANYL CITRATE (PF) 100 MCG/2ML IJ SOLN
INTRAMUSCULAR | Status: AC
  Administered 2017-10-10: 50 ug via INTRAVENOUS
  Filled 2017-10-10: qty 2

## 2017-10-10 MED ORDER — PROPOFOL 10 MG/ML IV BOLUS
INTRAVENOUS | Status: DC | PRN
  Administered 2017-10-10: 200 mg via INTRAVENOUS

## 2017-10-10 MED ORDER — FENTANYL CITRATE (PF) 100 MCG/2ML IJ SOLN
50.0000 ug | Freq: Once | INTRAMUSCULAR | Status: AC
  Administered 2017-10-10: 50 ug via INTRAVENOUS

## 2017-10-10 MED ORDER — MIDAZOLAM HCL 2 MG/2ML IJ SOLN
INTRAMUSCULAR | Status: DC | PRN
  Administered 2017-10-10 (×2): 1 mg via INTRAVENOUS

## 2017-10-10 MED ORDER — HEMOSTATIC AGENTS (NO CHARGE) OPTIME
TOPICAL | Status: DC | PRN
  Administered 2017-10-10: 1

## 2017-10-10 MED ORDER — LIDOCAINE 2% (20 MG/ML) 5 ML SYRINGE
INTRAMUSCULAR | Status: AC
  Filled 2017-10-10: qty 10

## 2017-10-10 MED ORDER — METHYLENE BLUE 0.5 % INJ SOLN
INTRAVENOUS | Status: AC
  Filled 2017-10-10: qty 10

## 2017-10-10 MED ORDER — CEFAZOLIN SODIUM-DEXTROSE 2-4 GM/100ML-% IV SOLN
2.0000 g | INTRAVENOUS | Status: AC
  Administered 2017-10-10: 2 g via INTRAVENOUS
  Filled 2017-10-10: qty 100

## 2017-10-10 MED ORDER — ENSURE SURGERY PO LIQD
592.0000 mL | Freq: Once | ORAL | Status: DC
  Filled 2017-10-10: qty 711

## 2017-10-10 MED ORDER — ONDANSETRON HCL 4 MG/2ML IJ SOLN
INTRAMUSCULAR | Status: AC
  Filled 2017-10-10: qty 2

## 2017-10-10 MED ORDER — MIDAZOLAM HCL 2 MG/2ML IJ SOLN
INTRAMUSCULAR | Status: AC
  Filled 2017-10-10: qty 2

## 2017-10-10 MED ORDER — 0.9 % SODIUM CHLORIDE (POUR BTL) OPTIME
TOPICAL | Status: DC | PRN
  Administered 2017-10-10: 1000 mL

## 2017-10-10 SURGICAL SUPPLY — 47 items
APPLIER CLIP 9.375 MED OPEN (MISCELLANEOUS) ×3
BINDER BREAST LRG (GAUZE/BANDAGES/DRESSINGS) ×3 IMPLANT
BLADE SURG 15 STRL LF DISP TIS (BLADE) ×1 IMPLANT
BLADE SURG 15 STRL SS (BLADE) ×2
CANISTER SUCT 3000ML PPV (MISCELLANEOUS) ×3 IMPLANT
CHLORAPREP W/TINT 26ML (MISCELLANEOUS) ×3 IMPLANT
CLIP APPLIE 9.375 MED OPEN (MISCELLANEOUS) ×1 IMPLANT
CLOSURE WOUND 1/2 X4 (GAUZE/BANDAGES/DRESSINGS) ×1
CONT SPEC 4OZ CLIKSEAL STRL BL (MISCELLANEOUS) ×9 IMPLANT
COVER PROBE W GEL 5X96 (DRAPES) ×3 IMPLANT
COVER SURGICAL LIGHT HANDLE (MISCELLANEOUS) ×3 IMPLANT
DERMABOND ADVANCED (GAUZE/BANDAGES/DRESSINGS) ×2
DERMABOND ADVANCED .7 DNX12 (GAUZE/BANDAGES/DRESSINGS) ×1 IMPLANT
DEVICE DUBIN SPECIMEN MAMMOGRA (MISCELLANEOUS) ×3 IMPLANT
DRAPE CHEST BREAST 15X10 FENES (DRAPES) ×3 IMPLANT
ELECT COATED BLADE 2.86 ST (ELECTRODE) ×3 IMPLANT
ELECT REM PT RETURN 9FT ADLT (ELECTROSURGICAL) ×3
ELECTRODE REM PT RTRN 9FT ADLT (ELECTROSURGICAL) ×1 IMPLANT
GLOVE BIO SURGEON STRL SZ7 (GLOVE) ×9 IMPLANT
GLOVE BIOGEL PI IND STRL 7.0 (GLOVE) ×2 IMPLANT
GLOVE BIOGEL PI IND STRL 7.5 (GLOVE) ×1 IMPLANT
GLOVE BIOGEL PI INDICATOR 7.0 (GLOVE) ×4
GLOVE BIOGEL PI INDICATOR 7.5 (GLOVE) ×2
GLOVE SURG SS PI 6.5 STRL IVOR (GLOVE) ×3 IMPLANT
GLOVE SURG SS PI 7.0 STRL IVOR (GLOVE) ×3 IMPLANT
GOWN STRL REUS W/ TWL LRG LVL3 (GOWN DISPOSABLE) ×2 IMPLANT
GOWN STRL REUS W/TWL LRG LVL3 (GOWN DISPOSABLE) ×4
HEMOSTAT ARISTA ABSORB 3G PWDR (MISCELLANEOUS) ×3 IMPLANT
KIT BASIN OR (CUSTOM PROCEDURE TRAY) ×3 IMPLANT
KIT MARKER MARGIN INK (KITS) ×3 IMPLANT
MARKER SKIN DUAL TIP RULER LAB (MISCELLANEOUS) ×3 IMPLANT
NEEDLE HYPO 25GX1X1/2 BEV (NEEDLE) ×3 IMPLANT
NS IRRIG 1000ML POUR BTL (IV SOLUTION) ×3 IMPLANT
PACK GENERAL/GYN (CUSTOM PROCEDURE TRAY) ×3 IMPLANT
PACK SURGICAL SETUP 50X90 (CUSTOM PROCEDURE TRAY) ×3 IMPLANT
SPONGE LAP 18X18 X RAY DECT (DISPOSABLE) ×3 IMPLANT
STRIP CLOSURE SKIN 1/2X4 (GAUZE/BANDAGES/DRESSINGS) ×2 IMPLANT
SUT MNCRL AB 4-0 PS2 18 (SUTURE) ×6 IMPLANT
SUT MON AB 5-0 PS2 18 (SUTURE) ×3 IMPLANT
SUT VIC AB 2-0 SH 27 (SUTURE) ×4
SUT VIC AB 2-0 SH 27XBRD (SUTURE) ×2 IMPLANT
SUT VIC AB 3-0 SH 27 (SUTURE) ×4
SUT VIC AB 3-0 SH 27X BRD (SUTURE) ×2 IMPLANT
SYR BULB 3OZ (MISCELLANEOUS) ×3 IMPLANT
SYR CONTROL 10ML LL (SYRINGE) ×3 IMPLANT
TOWEL GREEN STERILE (TOWEL DISPOSABLE) ×3 IMPLANT
YANKAUER SUCT BULB TIP NO VENT (SUCTIONS) ×3 IMPLANT

## 2017-10-10 NOTE — H&P (Signed)
67 yof referred by Dr Dema Severin for new right breast cancer. she has history of a wire guided excision for benign disease (not sure of path exactly) in 2002. she has fh in mother who had breast cancer early 22s. she had screening mm. no mass or dc. she was found to have b density breasts. she had a 6 mm right breast mass with calcificiations which appears 6 mm on [place] as well. axillary [place] negative. she also has 4.5 mm of calcs on left breasts. the calcs have appeared stable. 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 based on that she is now recommended to have both sets of calcs biopsied which are now both benign   Past Surgical History Breast Biopsy  Right. multiple Tonsillectomy   Diagnostic Studies History  Colonoscopy  1-5 years ago Mammogram  within last year  Allergies Bactrim *ANTI-INFECTIVE AGENTS - MISC.*   Medication History  Levothyroxine Sodium (100MCG Tablet, Oral) Active. Vitamin D3 (1000UNIT Capsule, Oral) Active. Medications Reconciled  Social History Alcohol use  Occasional alcohol use. Caffeine use  Carbonated beverages, Tea. Tobacco use  Never smoker.  Family History Arthritis  Father. Breast Cancer  Mother. Colon Polyps  Father. Hypertension  Father. Kidney Disease  Father. Migraine Headache  Daughter.  Pregnancy / Birth History Age at menarche  67 years. Age of menopause  66-55 Contraceptive History  Oral contraceptives. Gravida  1 Maternal age  66-20  Other Problems Kidney Stone  Thyroid Disease   Review of Systems  General Present- Night Sweats. Not Present- Appetite Loss, Chills, Fatigue, Fever, Weight Gain and Weight Loss. Skin Present- New Lesions. Not Present- Change in Wart/Mole, Dryness, Hives, Jaundice, Non-Healing Wounds, Rash and Ulcer. HEENT Present- Seasonal Allergies and Wears glasses/contact lenses. Not Present- Earache, Hearing Loss,  Hoarseness, Nose Bleed, Oral Ulcers, Ringing in the Ears, Sinus Pain, Sore Throat, Visual Disturbances and Yellow Eyes. Respiratory Present- Snoring. Not Present- Bloody sputum, Chronic Cough, Difficulty Breathing and Wheezing. Breast Present- Breast Mass. Not Present- Breast Pain, Nipple Discharge and Skin Changes. Cardiovascular Not Present- Chest Pain, Difficulty Breathing Lying Down, Leg Cramps, Palpitations, Rapid Heart Rate, Shortness of Breath and Swelling of Extremities. Gastrointestinal Present- Hemorrhoids. Not Present- Abdominal Pain, Bloating, Bloody Stool, Change in Bowel Habits, Chronic diarrhea, Constipation, Difficulty Swallowing, Excessive gas, Gets full quickly at meals, Indigestion, Nausea, Rectal Pain and Vomiting. Female Genitourinary Not Present- Frequency, Nocturia, Painful Urination, Pelvic Pain and Urgency. Musculoskeletal Not Present- Back Pain, Joint Pain, Joint Stiffness, Muscle Pain, Muscle Weakness and Swelling of Extremities. Neurological Not Present- Decreased Memory, Fainting, Headaches, Numbness, Seizures, Tingling, Tremor, Trouble walking and Weakness. Psychiatric Not Present- Anxiety, Bipolar, Change in Sleep Pattern, Depression, Fearful and Frequent crying. Endocrine Present- Hot flashes. Not Present- Cold Intolerance, Excessive Hunger, Hair Changes, Heat Intolerance and New Diabetes. Hematology Not Present- Blood Thinners, Easy Bruising, Excessive bleeding, Gland problems, HIV and Persistent Infections.  Vitals  Weight: 167.4 lb Height: 64in Body Surface Area: 1.81 m Body Mass Index: 28.73 kg/m  Temp.: 98.14F(Oral)  Pulse: 102 (Regular)  BP: 126/86 (Sitting, Left Arm, Standard) Physical Exam  General Mental Status-Alert. Orientation-Oriented X3. Head and Neck Trachea-midline. Thyroid Gland Characteristics - normal size and consistency. Eye Sclera/Conjunctiva - Bilateral-No scleral icterus. Chest and Lung Exam Chest and lung exam  reveals -quiet, even and easy respiratory effort with no use of accessory muscles and on auscultation, normal breath sounds, no  adventitious sounds and normal vocal resonance. Breast Nipples-No Discharge. Breast Lump-No Palpable Breast Mass. Cardiovascular Cardiovascular examination reveals -normal heart sounds, regular rate and rhythm with no murmurs. Lymphatic Head & Neck General Head & Neck Lymphatics: Bilateral - Description - Normal. Axillary General Axillary Region: Bilateral - Description - Normal. Note: no Osgood adenopathy   Assessment & Plan Rolm Bookbinder MD; 09/14/2017 3:49 PM) BREAST CANCER OF LOWER-OUTER QUADRANT OF RIGHT FEMALE BREAST (C50.511) Right breast seed guided lumpectomy and sn biopsy. We discussed the staging and pathophysiology of breast cancer. We discussed all of the different options for treatment for breast cancer including surgery, chemotherapy, radiation therapy, Herceptin, and antiestrogen therapy. We discussed a sentinel lymph node biopsy as she does not appear to having lymph node involvement right now. We discussed the performance of that with injection of radioactive tracer. We discussed that there is a chance of having a positive node with a sentinel lymph node biopsy and we will await the permanent pathology to make any other first further decisions in terms of her treatment. We discussed up to a 5% risk lifetime of chronic shoulder pain as well as lymphedema associated with a sentinel lymph node biopsy. We discussed the options for treatment of the breast cancer which included lumpectomy versus a mastectomy. We discussed the performance of the lumpectomy with radioactive seed placement. We discussed a 5-10% chance of a positive margin requiring reexcision in the operating room. We also discussed that she will need radiation therapy if she undergoes lumpectomy. The breast cannot undergo more radiation therapy in the same breast after lumpectomy in the  future. We discussed mastectomy and the postoperative care for that as well. Mastectomy can be followed by reconstruction. This is a more extensive surgery and requires more recovery. The decision for lumpectomy vs mastectomy has no impact on decision for chemotherapy. Most mastectomy patients will not need radiation therapy. We discussed that there is no difference in her survival whether she undergoes lumpectomy with radiation therapy or antiestrogen therapy versus a mastectomy. There is also no real difference between her recurrence in the breast. We discussed the risks of operation including bleeding, infection, possible reoperation.

## 2017-10-10 NOTE — Interval H&P Note (Signed)
History and Physical Interval Note:  10/10/2017 12:17 PM  Victoria Pacheco  has presented today for surgery, with the diagnosis of RIGHT BREAST CANCER  The various methods of treatment have been discussed with the patient and family. After consideration of risks, benefits and other options for treatment, the patient has consented to  Procedure(s): BREAST LUMPECTOMY WITH RADIOACTIVE SEED AND SENTINEL LYMPH NODE BIOPSY (Right) as a surgical intervention .  The patient's history has been reviewed, patient examined, no change in status, stable for surgery.  I have reviewed the patient's chart and labs.  Questions were answered to the patient's satisfaction.     Rolm Bookbinder

## 2017-10-10 NOTE — Anesthesia Procedure Notes (Signed)
Procedure Name: LMA Insertion Date/Time: 10/10/2017 12:54 PM Performed by: Julieta Bellini, CRNA Pre-anesthesia Checklist: Patient identified, Emergency Drugs available, Suction available, Patient being monitored and Timeout performed Patient Re-evaluated:Patient Re-evaluated prior to induction Oxygen Delivery Method: Circle system utilized Preoxygenation: Pre-oxygenation with 100% oxygen Induction Type: IV induction LMA: LMA inserted LMA Size: 4.0 Number of attempts: 1 Placement Confirmation: CO2 detector and positive ETCO2 Tube secured with: Tape

## 2017-10-10 NOTE — Op Note (Signed)
Preoperative diagnosis: Right breast cancer, clinical stage I Postoperative diagnosis: same as above Procedure:Right breast seed guided lumpectomy Rightdeep axillary sentinel node biopsy Surgeon: Dr Serita Grammes GYI:RSWNIOE Anes: general  Specimens  1. rightbreast tissue marked with paint 2.rightaxillary sentinel nodes with highest count 975 3. Additional superior/lateral and medial margins marked short superior, long lateral and double deep Complications none Drains none Sponge count correct Dispo to pacu stable  Indications: This is a5 yof with new right breast cancer.  We discussed options and have elected to proceed with lumpectomy and sentinel node biopsy with port placement. She had radioactive seed placed prior to beginning and the mammograms were available for review.  Procedure: After informed consent was obtained the patient was taken to the operating room. She first was given technetium in standard periareolar fashion. She had a pectoral block. She was given antibiotics. Sequential compression devices were on her legs. She was then placed under general anesthesia with an LMA. Then she was prepped and draped in the standard sterile surgical fashion. Surgical timeout was then performed.  I then located the seed in the lateral right breastI infiltrated marcaine in the skin and then madeaperiareolarincisionto attempt to hide the scar.I then used the neoprobe to remove the seed and the surrounding tissue with attempt to get clear margins. I marked this with paint. MM confirmed removal of seed and theclip.I did remove some extra margins as above after pathology had reviewed specimen. I placed clips in the cavity.I then obtained hemostasis. This was marked as above.I closed with with 2-0 vicryl to approximate breast tissue. The skin was closed with 3-0 vicryl and 5-0 monocryl. Glue and steristrips were placed.   I then made a low axillary incision after  locating the sentinel node.Icarried this through the axillary fascia.there was radioactivity present that was easily noted. I removed the radioactive nodes.The background radioactivity was minimal.I then obtained hemostasis. I closed the axillary fascia with 2-0 vicryl.The skin was closed with 3-0 vicryl and 4-0 monocryl. Glue and steristrips were applied.

## 2017-10-10 NOTE — Anesthesia Preprocedure Evaluation (Addendum)
Anesthesia Evaluation  Patient identified by MRN, date of birth, ID band Patient awake    Reviewed: Allergy & Precautions, NPO status , Patient's Chart, lab work & pertinent test results  Airway Mallampati: II  TM Distance: >3 FB Neck ROM: Full    Dental  (+) Dental Advisory Given   Pulmonary asthma ,    breath sounds clear to auscultation       Cardiovascular negative cardio ROS   Rhythm:Regular Rate:Normal     Neuro/Psych negative neurological ROS     GI/Hepatic Neg liver ROS, GERD  ,  Endo/Other  Hypothyroidism   Renal/GU negative Renal ROS     Musculoskeletal   Abdominal   Peds  Hematology negative hematology ROS (+)   Anesthesia Other Findings   Reproductive/Obstetrics                             Lab Results  Component Value Date   WBC 5.7 10/05/2017   HGB 12.7 10/05/2017   HCT 39.1 10/05/2017   MCV 96.1 10/05/2017   PLT 221 10/05/2017   Lab Results  Component Value Date   CREATININE 1.17 06/09/2015   BUN 12 06/09/2015   NA 141 06/09/2015   K 3.9 06/09/2015   CL 105 06/09/2015   CO2 28 06/09/2015    Anesthesia Physical Anesthesia Plan  ASA: II  Anesthesia Plan: General   Post-op Pain Management:  Regional for Post-op pain   Induction: Intravenous  PONV Risk Score and Plan: 3 and Ondansetron, Dexamethasone, Treatment may vary due to age or medical condition and Midazolam  Airway Management Planned: LMA  Additional Equipment:   Intra-op Plan:   Post-operative Plan: Extubation in OR  Informed Consent: I have reviewed the patients History and Physical, chart, labs and discussed the procedure including the risks, benefits and alternatives for the proposed anesthesia with the patient or authorized representative who has indicated his/her understanding and acceptance.   Dental advisory given  Plan Discussed with: CRNA  Anesthesia Plan Comments:         Anesthesia Quick Evaluation

## 2017-10-10 NOTE — Anesthesia Procedure Notes (Signed)
Anesthesia Regional Block: Pectoralis block   Pre-Anesthetic Checklist: ,, timeout performed, Correct Patient, Correct Site, Correct Laterality, Correct Procedure, Correct Position, site marked, Risks and benefits discussed,  Surgical consent,  Pre-op evaluation,  At surgeon's request and post-op pain management  Laterality: Right  Prep: chloraprep       Needles:  Injection technique: Single-shot  Needle Type: Echogenic Needle     Needle Length: 9cm  Needle Gauge: 21     Additional Needles:   Procedures:,,,, ultrasound used (permanent image in chart),,,,  Narrative:  Start time: 10/10/2017 12:00 PM End time: 10/10/2017 12:06 PM Injection made incrementally with aspirations every 5 mL.  Performed by: Personally  Anesthesiologist: Suzette Battiest, MD

## 2017-10-10 NOTE — Transfer of Care (Signed)
Immediate Anesthesia Transfer of Care Note  Patient: Victoria Pacheco  Procedure(s) Performed: BREAST LUMPECTOMY WITH RADIOACTIVE SEED AND SENTINEL LYMPH NODE BIOPSY (Right Breast)  Patient Location: PACU  Anesthesia Type:General and GA combined with regional for post-op pain  Level of Consciousness: awake, alert , oriented and patient cooperative  Airway & Oxygen Therapy: Patient Spontanous Breathing and Patient connected to nasal cannula oxygen  Post-op Assessment: Report given to RN, Post -op Vital signs reviewed and stable and Patient moving all extremities X 4  Post vital signs: Reviewed and stable  Last Vitals:  Vitals:   10/10/17 1210 10/10/17 1215  BP: 135/73 109/75  Pulse: 69 80  Resp: 14 14  Temp:    SpO2: 99% 97%    Last Pain:  Vitals:   10/10/17 1033  TempSrc: Oral      Patients Stated Pain Goal: 3 (56/31/49 7026)  Complications: No apparent anesthesia complications

## 2017-10-10 NOTE — Discharge Instructions (Signed)
[facility] °Office Phone Number [phone] ° °BREAST BIOPSY/ PARTIAL MASTECTOMY: POST OP INSTRUCTIONS ° °Always review your discharge instruction sheet given to you by the facility where your surgery was performed. ° °IF YOU HAVE DISABILITY OR FAMILY LEAVE FORMS, YOU MUST BRING THEM TO THE OFFICE FOR PROCESSING.  DO NOT GIVE THEM TO YOUR DOCTOR. ° °1. A prescription for pain medication may be given to you upon discharge.  Take your pain medication as prescribed, if needed.  If narcotic pain medicine is not needed, then you may take acetaminophen (Tylenol), naprosyn (Alleve) or ibuprofen (Advil) as needed. °2. Take your usually prescribed medications unless otherwise directed °3. If you need a refill on your pain medication, please contact your pharmacy.  They will contact our office to request authorization.  Prescriptions will not be filled after 5pm or on week-ends. °4. You should eat very light the first 24 hours after surgery, such as soup, crackers, pudding, etc.  Resume your normal diet the day after surgery. °5. Most patients will experience some swelling and bruising in the breast.  Ice packs and a good support bra will help.  Wear the breast binder provided or a sports bra for 72 hours day and night.  After that wear a sports bra during the day until you return to the office. Swelling and bruising can take several days to resolve.  °6. It is common to experience some constipation if taking pain medication after surgery.  Increasing fluid intake and taking a stool softener will usually help or prevent this problem from occurring.  A mild laxative (Milk of Magnesia or Miralax) should be taken according to package directions if there are no bowel movements after 48 hours. °7. Unless discharge instructions indicate otherwise, you may remove your bandages 48 hours after surgery and you may shower at that time.  You may have steri-strips (small skin tapes) in place directly over the incision.   These strips should be left on the skin for 7-10 days and will come off on their own.  If your surgeon used skin glue on the incision, you may shower in 24 hours.  The glue will flake off over the next 2-3 weeks.  Any sutures or staples will be removed at the office during your follow-up visit. °8. ACTIVITIES:  You may resume regular daily activities (gradually increasing) beginning the next day.  Wearing a good support bra or sports bra minimizes pain and swelling.  You may have sexual intercourse when it is comfortable. °a. You may drive when you no longer are taking prescription pain medication, you can comfortably wear a seatbelt, and you can safely maneuver your car and apply brakes. °b. RETURN TO WORK:  ______________________________________________________________________________________ °9. You should see your doctor in the office for a follow-up appointment approximately two weeks after your surgery.  Your doctor’s nurse will typically make your follow-up appointment when she calls you with your pathology report.  Expect your pathology report 3-4 business days after your surgery.  You may call to check if you do not hear from us after three days. °10. OTHER INSTRUCTIONS: _______________________________________________________________________________________________ _____________________________________________________________________________________________________________________________________ °_____________________________________________________________________________________________________________________________________ °_____________________________________________________________________________________________________________________________________ ° °WHEN TO CALL DR Sheralee Qazi: °1. Fever over 101.0 °2. Nausea and/or vomiting. °3. Extreme swelling or bruising. °4. Continued bleeding from incision. °5. Increased pain, redness, or drainage from the incision. ° °The clinic staff is available to  answer your questions during regular business hours.  Please don’t hesitate to call and ask to speak to one of the nurses for   clinical concerns.  If you have a medical emergency, go to the nearest emergency room or call 911.  A surgeon from [facility] is always on call at the hospital. ° °For further questions, please visit [email or web address] ° °

## 2017-10-11 ENCOUNTER — Encounter (HOSPITAL_COMMUNITY): Payer: Self-pay | Admitting: General Surgery

## 2017-10-11 NOTE — Anesthesia Postprocedure Evaluation (Signed)
Anesthesia Post Note  Patient: Victoria Pacheco  Procedure(s) Performed: BREAST LUMPECTOMY WITH RADIOACTIVE SEED AND SENTINEL LYMPH NODE BIOPSY (Right Breast)     Patient location during evaluation: PACU Anesthesia Type: General Level of consciousness: awake and alert Pain management: pain level controlled Vital Signs Assessment: post-procedure vital signs reviewed and stable Respiratory status: spontaneous breathing, nonlabored ventilation, respiratory function stable and patient connected to nasal cannula oxygen Cardiovascular status: blood pressure returned to baseline and stable Postop Assessment: no apparent nausea or vomiting Anesthetic complications: no    Last Vitals:  Vitals:   10/10/17 1447 10/10/17 1507  BP: 114/66 123/70  Pulse: 63 65  Resp: 17 16  Temp: (!) 36.3 C (!) 36.3 C  SpO2: 100% 96%    Last Pain:  Vitals:   10/10/17 1033  TempSrc: Oral                 Tiajuana Amass

## 2017-10-27 DIAGNOSIS — Z170 Estrogen receptor positive status [ER+]: Secondary | ICD-10-CM | POA: Diagnosis not present

## 2017-10-27 DIAGNOSIS — C50511 Malignant neoplasm of lower-outer quadrant of right female breast: Secondary | ICD-10-CM | POA: Diagnosis not present

## 2017-10-31 ENCOUNTER — Encounter: Payer: Self-pay | Admitting: Genetic Counselor

## 2017-10-31 ENCOUNTER — Telehealth: Payer: Self-pay | Admitting: Genetic Counselor

## 2017-10-31 NOTE — Telephone Encounter (Signed)
Genetic counseling appt has been scheduled for the pt to see Steele Berg on 3/21 at 930am. Address verified. Letter and directions mailed to the pt.

## 2017-11-02 DIAGNOSIS — Z170 Estrogen receptor positive status [ER+]: Secondary | ICD-10-CM | POA: Diagnosis not present

## 2017-11-02 DIAGNOSIS — C50511 Malignant neoplasm of lower-outer quadrant of right female breast: Secondary | ICD-10-CM | POA: Diagnosis not present

## 2017-11-09 DIAGNOSIS — C50511 Malignant neoplasm of lower-outer quadrant of right female breast: Secondary | ICD-10-CM | POA: Diagnosis not present

## 2017-11-09 DIAGNOSIS — Z170 Estrogen receptor positive status [ER+]: Secondary | ICD-10-CM | POA: Diagnosis not present

## 2017-11-09 DIAGNOSIS — Z510 Encounter for antineoplastic radiation therapy: Secondary | ICD-10-CM | POA: Diagnosis not present

## 2017-11-10 DIAGNOSIS — R222 Localized swelling, mass and lump, trunk: Secondary | ICD-10-CM | POA: Diagnosis not present

## 2017-11-10 DIAGNOSIS — Z170 Estrogen receptor positive status [ER+]: Secondary | ICD-10-CM | POA: Diagnosis not present

## 2017-11-10 DIAGNOSIS — C50511 Malignant neoplasm of lower-outer quadrant of right female breast: Secondary | ICD-10-CM | POA: Diagnosis not present

## 2017-11-14 DIAGNOSIS — Z170 Estrogen receptor positive status [ER+]: Secondary | ICD-10-CM | POA: Diagnosis not present

## 2017-11-14 DIAGNOSIS — C50511 Malignant neoplasm of lower-outer quadrant of right female breast: Secondary | ICD-10-CM | POA: Diagnosis not present

## 2017-11-14 DIAGNOSIS — Z510 Encounter for antineoplastic radiation therapy: Secondary | ICD-10-CM | POA: Diagnosis not present

## 2017-11-15 DIAGNOSIS — Z510 Encounter for antineoplastic radiation therapy: Secondary | ICD-10-CM | POA: Diagnosis not present

## 2017-11-15 DIAGNOSIS — Z170 Estrogen receptor positive status [ER+]: Secondary | ICD-10-CM | POA: Diagnosis not present

## 2017-11-15 DIAGNOSIS — C50511 Malignant neoplasm of lower-outer quadrant of right female breast: Secondary | ICD-10-CM | POA: Diagnosis not present

## 2017-11-16 DIAGNOSIS — C50511 Malignant neoplasm of lower-outer quadrant of right female breast: Secondary | ICD-10-CM | POA: Diagnosis not present

## 2017-11-16 DIAGNOSIS — Z170 Estrogen receptor positive status [ER+]: Secondary | ICD-10-CM | POA: Diagnosis not present

## 2017-11-16 DIAGNOSIS — Z510 Encounter for antineoplastic radiation therapy: Secondary | ICD-10-CM | POA: Diagnosis not present

## 2017-11-17 DIAGNOSIS — Z510 Encounter for antineoplastic radiation therapy: Secondary | ICD-10-CM | POA: Diagnosis not present

## 2017-11-17 DIAGNOSIS — Z170 Estrogen receptor positive status [ER+]: Secondary | ICD-10-CM | POA: Diagnosis not present

## 2017-11-17 DIAGNOSIS — C50511 Malignant neoplasm of lower-outer quadrant of right female breast: Secondary | ICD-10-CM | POA: Diagnosis not present

## 2017-11-17 DIAGNOSIS — R222 Localized swelling, mass and lump, trunk: Secondary | ICD-10-CM | POA: Diagnosis not present

## 2017-11-18 DIAGNOSIS — Z170 Estrogen receptor positive status [ER+]: Secondary | ICD-10-CM | POA: Diagnosis not present

## 2017-11-18 DIAGNOSIS — C50511 Malignant neoplasm of lower-outer quadrant of right female breast: Secondary | ICD-10-CM | POA: Diagnosis not present

## 2017-11-18 DIAGNOSIS — Z510 Encounter for antineoplastic radiation therapy: Secondary | ICD-10-CM | POA: Diagnosis not present

## 2017-11-21 DIAGNOSIS — Z170 Estrogen receptor positive status [ER+]: Secondary | ICD-10-CM | POA: Diagnosis not present

## 2017-11-21 DIAGNOSIS — Z510 Encounter for antineoplastic radiation therapy: Secondary | ICD-10-CM | POA: Diagnosis not present

## 2017-11-21 DIAGNOSIS — C50511 Malignant neoplasm of lower-outer quadrant of right female breast: Secondary | ICD-10-CM | POA: Diagnosis not present

## 2017-11-22 DIAGNOSIS — Z510 Encounter for antineoplastic radiation therapy: Secondary | ICD-10-CM | POA: Diagnosis not present

## 2017-11-22 DIAGNOSIS — Z170 Estrogen receptor positive status [ER+]: Secondary | ICD-10-CM | POA: Diagnosis not present

## 2017-11-22 DIAGNOSIS — C50511 Malignant neoplasm of lower-outer quadrant of right female breast: Secondary | ICD-10-CM | POA: Diagnosis not present

## 2017-11-23 DIAGNOSIS — Z510 Encounter for antineoplastic radiation therapy: Secondary | ICD-10-CM | POA: Diagnosis not present

## 2017-11-23 DIAGNOSIS — C50511 Malignant neoplasm of lower-outer quadrant of right female breast: Secondary | ICD-10-CM | POA: Diagnosis not present

## 2017-11-23 DIAGNOSIS — Z170 Estrogen receptor positive status [ER+]: Secondary | ICD-10-CM | POA: Diagnosis not present

## 2017-11-24 ENCOUNTER — Other Ambulatory Visit: Payer: Medicare Other

## 2017-11-24 DIAGNOSIS — C50511 Malignant neoplasm of lower-outer quadrant of right female breast: Secondary | ICD-10-CM | POA: Diagnosis not present

## 2017-11-24 DIAGNOSIS — Z510 Encounter for antineoplastic radiation therapy: Secondary | ICD-10-CM | POA: Diagnosis not present

## 2017-11-24 DIAGNOSIS — Z170 Estrogen receptor positive status [ER+]: Secondary | ICD-10-CM | POA: Diagnosis not present

## 2017-11-25 DIAGNOSIS — C50511 Malignant neoplasm of lower-outer quadrant of right female breast: Secondary | ICD-10-CM | POA: Diagnosis not present

## 2017-11-25 DIAGNOSIS — Z170 Estrogen receptor positive status [ER+]: Secondary | ICD-10-CM | POA: Diagnosis not present

## 2017-11-25 DIAGNOSIS — Z510 Encounter for antineoplastic radiation therapy: Secondary | ICD-10-CM | POA: Diagnosis not present

## 2017-11-28 DIAGNOSIS — Z510 Encounter for antineoplastic radiation therapy: Secondary | ICD-10-CM | POA: Diagnosis not present

## 2017-11-28 DIAGNOSIS — Z170 Estrogen receptor positive status [ER+]: Secondary | ICD-10-CM | POA: Diagnosis not present

## 2017-11-28 DIAGNOSIS — C50511 Malignant neoplasm of lower-outer quadrant of right female breast: Secondary | ICD-10-CM | POA: Diagnosis not present

## 2017-11-29 DIAGNOSIS — Z510 Encounter for antineoplastic radiation therapy: Secondary | ICD-10-CM | POA: Diagnosis not present

## 2017-11-29 DIAGNOSIS — C50511 Malignant neoplasm of lower-outer quadrant of right female breast: Secondary | ICD-10-CM | POA: Diagnosis not present

## 2017-11-29 DIAGNOSIS — Z170 Estrogen receptor positive status [ER+]: Secondary | ICD-10-CM | POA: Diagnosis not present

## 2017-11-30 DIAGNOSIS — Z510 Encounter for antineoplastic radiation therapy: Secondary | ICD-10-CM | POA: Diagnosis not present

## 2017-11-30 DIAGNOSIS — Z170 Estrogen receptor positive status [ER+]: Secondary | ICD-10-CM | POA: Diagnosis not present

## 2017-11-30 DIAGNOSIS — C50511 Malignant neoplasm of lower-outer quadrant of right female breast: Secondary | ICD-10-CM | POA: Diagnosis not present

## 2017-12-01 DIAGNOSIS — Z510 Encounter for antineoplastic radiation therapy: Secondary | ICD-10-CM | POA: Diagnosis not present

## 2017-12-01 DIAGNOSIS — Z170 Estrogen receptor positive status [ER+]: Secondary | ICD-10-CM | POA: Diagnosis not present

## 2017-12-01 DIAGNOSIS — C50511 Malignant neoplasm of lower-outer quadrant of right female breast: Secondary | ICD-10-CM | POA: Diagnosis not present

## 2017-12-02 DIAGNOSIS — Z170 Estrogen receptor positive status [ER+]: Secondary | ICD-10-CM | POA: Diagnosis not present

## 2017-12-02 DIAGNOSIS — C50511 Malignant neoplasm of lower-outer quadrant of right female breast: Secondary | ICD-10-CM | POA: Diagnosis not present

## 2017-12-02 DIAGNOSIS — Z510 Encounter for antineoplastic radiation therapy: Secondary | ICD-10-CM | POA: Diagnosis not present

## 2017-12-05 DIAGNOSIS — C50511 Malignant neoplasm of lower-outer quadrant of right female breast: Secondary | ICD-10-CM | POA: Diagnosis not present

## 2017-12-05 DIAGNOSIS — Z170 Estrogen receptor positive status [ER+]: Secondary | ICD-10-CM | POA: Diagnosis not present

## 2017-12-05 DIAGNOSIS — Z510 Encounter for antineoplastic radiation therapy: Secondary | ICD-10-CM | POA: Diagnosis not present

## 2017-12-06 DIAGNOSIS — Z510 Encounter for antineoplastic radiation therapy: Secondary | ICD-10-CM | POA: Diagnosis not present

## 2017-12-06 DIAGNOSIS — C50511 Malignant neoplasm of lower-outer quadrant of right female breast: Secondary | ICD-10-CM | POA: Diagnosis not present

## 2017-12-06 DIAGNOSIS — Z170 Estrogen receptor positive status [ER+]: Secondary | ICD-10-CM | POA: Diagnosis not present

## 2017-12-20 DIAGNOSIS — Z6830 Body mass index (BMI) 30.0-30.9, adult: Secondary | ICD-10-CM | POA: Diagnosis not present

## 2017-12-20 DIAGNOSIS — E038 Other specified hypothyroidism: Secondary | ICD-10-CM | POA: Diagnosis not present

## 2017-12-20 DIAGNOSIS — E559 Vitamin D deficiency, unspecified: Secondary | ICD-10-CM | POA: Diagnosis not present

## 2017-12-20 DIAGNOSIS — M859 Disorder of bone density and structure, unspecified: Secondary | ICD-10-CM | POA: Diagnosis not present

## 2017-12-20 DIAGNOSIS — C50911 Malignant neoplasm of unspecified site of right female breast: Secondary | ICD-10-CM | POA: Diagnosis not present

## 2017-12-20 DIAGNOSIS — K635 Polyp of colon: Secondary | ICD-10-CM | POA: Diagnosis not present

## 2018-01-04 ENCOUNTER — Encounter: Payer: Self-pay | Admitting: Genetic Counselor

## 2018-01-04 ENCOUNTER — Inpatient Hospital Stay: Payer: Medicare Other

## 2018-01-04 ENCOUNTER — Inpatient Hospital Stay: Payer: Medicare Other | Attending: Genetic Counselor | Admitting: Genetic Counselor

## 2018-01-04 DIAGNOSIS — D1721 Benign lipomatous neoplasm of skin and subcutaneous tissue of right arm: Secondary | ICD-10-CM

## 2018-01-04 DIAGNOSIS — Z803 Family history of malignant neoplasm of breast: Secondary | ICD-10-CM

## 2018-01-04 DIAGNOSIS — C50111 Malignant neoplasm of central portion of right female breast: Secondary | ICD-10-CM | POA: Insufficient documentation

## 2018-01-04 DIAGNOSIS — D125 Benign neoplasm of sigmoid colon: Secondary | ICD-10-CM

## 2018-01-04 DIAGNOSIS — Z7183 Encounter for nonprocreative genetic counseling: Secondary | ICD-10-CM

## 2018-01-04 DIAGNOSIS — Z170 Estrogen receptor positive status [ER+]: Secondary | ICD-10-CM

## 2018-01-04 NOTE — Progress Notes (Signed)
REFERRING PROVIDER: Rolm Bookbinder, MD [place],  [address]  PRIMARY PROVIDER:  Brunetta Jeans, PA-C  PRIMARY REASON FOR VISIT:  1. Benign neoplasm of sigmoid colon   2. Family history of breast cancer   3. Malignant neoplasm of central portion of right breast in female, estrogen receptor positive ([place])   4. Lipoma of right upper extremity      HISTORY OF PRESENT ILLNESS:   Ms. Haydu, a 67 y.o. female, was seen for a  cancer genetics consultation at the request of Dr. Donne Hazel due to a personal and family history of cancer.  Ms. Aranas presents to clinic today to discuss the possibility of a hereditary predisposition to cancer, genetic testing, and to further clarify her future cancer risks, as well as potential cancer risks for family members.   In 2018, at the age of 68, Ms. Skipper was diagnosed with invasive ductal carcinoma of the right breast. This was treated with lumpectomy and radiation.  She will start an aromatase inhibitor soon.  Ms. Bartelson states that she has had multiple lipomas, Colon polyps and uterine fibroids, and her medical history reports that she has Hashimoto's thyroiditis and fibrocystic breast disease.     CANCER HISTORY:   No history exists.     HORMONAL RISK FACTORS:  Menarche was at age 30.  First live birth at age 76.  OCP use for approximately 20+ years.  Ovaries intact: yes.  Hysterectomy: no.  Menopausal status: postmenopausal.  HRT use: 10 years. Colonoscopy: yes; a few small polyps and in 2007 a sigmoid AVM was noticed.. Mammogram within the last year: yes. Number of breast biopsies: 2. Up to date with pelvic exams:  yes. Any excessive radiation exposure in the past:  no  Past Medical History:  Diagnosis Date  . Allergic rhinitis   . Asthma    related to allergy to cockateil (bird)   . Cancer [facility])    Breast- stage one , stereotactic core biopsy - 08/2017 & again in 09/2017  . Diffuse cystic  mastopathy   . Family history of breast cancer   . GERD (gastroesophageal reflux disease)   . Headache    hx of cluster headache , better since post menopausal  . History of kidney stones    passed spontaneously - fr. a dietary supplement   . Hx of colonic polyps   . Hypercholesteremia   . Hypothyroidism 1989   thyroid scan done with radio iodine   . Left knee injury   . Multiple lipomas   . Osteopenia   . Pneumonia    not hosp. - treated with antibiotics ( several courses)   . Unspecified menopausal and postmenopausal disorder   . Vitamin D deficiency     Past Surgical History:  Procedure Laterality Date  . benign right breast biopsy  2002  . BREAST LUMPECTOMY WITH RADIOACTIVE SEED AND SENTINEL LYMPH NODE BIOPSY Right 10/10/2017   Procedure: BREAST LUMPECTOMY WITH RADIOACTIVE SEED AND SENTINEL LYMPH NODE BIOPSY;  Surgeon: Rolm Bookbinder, MD;  Location: [place];  Service: General;  Laterality: Right;  . TONSILLECTOMY AND ADENOIDECTOMY     at age 89  . WISDOM TOOTH EXTRACTION     as a teen    Social History   Socioeconomic History  . Marital status: Married    Spouse name: james x 40 + yrs  . Number of children: 1  . Years of education: Not on file  . Highest education level:  Not on file  Occupational History  . Not on file  Social Needs  . Financial resource strain: Not on file  . Food insecurity:    Worry: Not on file    Inability: Not on file  . Transportation needs:    Medical: Not on file    Non-medical: Not on file  Tobacco Use  . Smoking status: Never Smoker  . Smokeless tobacco: Never Used  Substance and Sexual Activity  . Alcohol use: Yes    Comment: social use  . Drug use: No  . Sexual activity: Not on file  Lifestyle  . Physical activity:    Days per week: Not on file    Minutes per session: Not on file  . Stress: Not on file  Relationships  . Social connections:    Talks on phone: Not on file    Gets together: Not on file    Attends  religious service: Not on file    Active member of club or organization: Not on file    Attends meetings of clubs or organizations: Not on file    Relationship status: Not on file  Other Topics Concern  . Not on file  Social History Narrative  . Not on file     FAMILY HISTORY:  We obtained a detailed, 4-generation family history.  Significant diagnoses are listed below: Family History  Problem Relation Age of Onset  . Colon polyps Father 42  . Hypertension Father   . Alzheimer's disease Father        Deceased-95  . Breast cancer Mother 25       diagnosed second time at 37; Deceased-61  . Healthy Daughter   . Alzheimer's disease Paternal Aunt   . Throat cancer Paternal Uncle   . Lung cancer Paternal Uncle   . Prostate cancer Paternal Uncle   . Lung cancer Maternal Uncle   . Stroke Maternal Grandmother   . Bladder Cancer Maternal Grandfather   . Heart attack Paternal Grandmother   . Colon cancer Neg Hx     The patient has one daughter who is cancer free.  She is an only child.  Her parents are both deceased.  The patient's mother had bilateral breast cancer at 41 and again at 2.  She had three brothers and four sisters.  One brother had lung cancer and he had a daughter who had leukemia in her 30's-40's.   Both maternal grandparents are deceased, her grandfather had bladder cancer.  The patient's father had colon polyps.  He died at 72.  He had five brothers and two sisters.  One brother had lung cancer, another had cancer of the larynx, and the last had prostate cancer.  The paternal grandparents are both deceased from non cancer related issues.  Ms. Rollo is unaware of previous family history of genetic testing for hereditary cancer risks. Patient's ancestors are of [place], [place], [place], [place] and [place] descent. There is no reported Ashkenazi Jewish ancestry. There is no known consanguinity.   GENETIC COUNSELING ASSESSMENT: Rosibel Giacobbe is a 67 y.o. female with a  personal and family history of breast cancer which is somewhat suggestive of a hereditary cancer syndrome and predisposition to cancer. We, therefore, discussed and recommended the following at today's visit.   DISCUSSION: We discussed that about 5-10% of breast cancer is hereditary with most cases due to BRCA mutations.  There are other genes associated with hereditary cancer syndromes, most commonly we see ATM, CHEK2 and PALB2.  Based on the patient's personal diagnosis of breast cancer and previous diagnosis of Hashimoto's thyroiditis, colon polyps, AVM, lipomas, fibroids and fibrocystic breast disease, we discussed the possibility of PTEN mutations.  Individuals with PTEN mutations are at higher risk for certain cancers, most commonly breast cancer, but also uterine, thyroid, melanoma and kidney cancers.  A risk assessment for PTEN mutations is below.    The patients was very ambivalent on whether she wanted to pursue genetic testing.  She states that she has been referred for testing in the past and has always declined it.  We discussed the pros and cons of testing, and that the goal of genetic testing is prevention.  She states that she could not live with herself if she found that she was positive.  We explored this statement a bit and determined that it was due to uncertainty of the future and guilt that makes her say this.  We reviewed the characteristics, features and inheritance patterns of hereditary cancer syndromes. We also discussed genetic testing, including the appropriate family members to test, the process of testing, insurance coverage and turn-around-time for results. We discussed the implications of a negative, positive and/or variant of uncertain significant result. We recommended Ms. Merkle pursue genetic testing for the common hereditary gene panel. The Hereditary Gene Panel offered by [facility] includes sequencing and/or deletion duplication testing of the following 47 genes: APC, ATM,  AXIN2, BARD1, BMPR1A, BRCA1, BRCA2, BRIP1, CDH1, CDK4, CDKN2A (p14ARF), CDKN2A (p16INK4a), CHEK2, CTNNA1, DICER1, EPCAM (Deletion/duplication testing only), GREM1 (promoter region deletion/duplication testing only), KIT, MEN1, MLH1, MSH2, MSH3, MSH6, MUTYH, NBN, NF1, NHTL1, PALB2, PDGFRA, PMS2, POLD1, POLE, PTEN, RAD50, RAD51C, RAD51D, SDHB, SDHC, SDHD, SMAD4, SMARCA4. STK11, TP53, TSC1, TSC2, and VHL.  The following genes were evaluated for sequence changes only: SDHA and HOXB13 c.251G>A variant only.   We discussed that some people do not want to undergo genetic testing due to fear of genetic discrimination.  A federal law called the Genetic Information Non-Discrimination Act (GINA) of 2008 helps protect individuals against genetic discrimination based on their genetic test results.  It impacts both health insurance and employment.  With health insurance, it protects against increased premiums, being kicked off insurance or being forced to take a test in order to be insured.  For employment it protects against hiring, firing and promoting decisions based on genetic test results.  Health status due to a cancer diagnosis is not protected under GINA.  Based on Ms. Grimmett's personal and family history of cancer, she meets medical criteria for genetic testing. Despite that she meets criteria, she may still have an out of pocket cost. We discussed that if her out of pocket cost for testing is over $100, the laboratory will call and confirm whether she wants to proceed with testing.  If the out of pocket cost of testing is less than $100 she will be billed by the genetic testing laboratory.   In order to estimate her chance of having a PTEN mutation, we used a statistical model ([facility] Score Calculator) and laboratory data that take into account her personal medical history, family history and ancestry.  Because each model is different, there can be a lot of variability in the risks they give.   Therefore, these numbers must be considered a rough range and not a precise risk of having a PTEN mutation.  This model estimates that she has approximately a 4.4-12.4% chance of having a PTEN mutation. Based on this assessment of her family  and personal history, genetic testing is recommended.      PLAN:  Despite our recommendation, Ms. Estrin did not wish to pursue genetic testing at today's visit. She wants to think on this further and would like to discuss with her daughter.  We understand this decision, and remain available to coordinate genetic testing at any time in the future. We, therefore, recommend Ms. Difiore continue to follow the cancer screening guidelines given by her primary healthcare provider.  Lastly, we encouraged Ms. Lukehart to remain in contact with cancer genetics annually so that we can continuously update the family history and inform her of any changes in cancer genetics and testing that may be of benefit for this family.   Ms.  Minor questions were answered to her satisfaction today. Our contact information was provided should additional questions or concerns arise. Thank you for the referral and allowing [place] to share in the care of your patient.   Anothony Bursch P. Florene Glen, [place], [facility] Certified Genetic Counselor [place] [email or web address] phone: [phone]  The patient was seen for a total of 70 minutes in face-to-face genetic counseling.  This patient was discussed with Drs. Magrinat, Lindi Adie and/or Burr Medico who agrees with the above.    _______________________________________________________________________ For Office Staff:  Number of people involved in session: 2 Was an Intern/ student involved with case: no

## 2018-01-09 DIAGNOSIS — C50511 Malignant neoplasm of lower-outer quadrant of right female breast: Secondary | ICD-10-CM | POA: Diagnosis not present

## 2018-01-09 DIAGNOSIS — Z170 Estrogen receptor positive status [ER+]: Secondary | ICD-10-CM | POA: Diagnosis not present

## 2018-04-17 DIAGNOSIS — M8589 Other specified disorders of bone density and structure, multiple sites: Secondary | ICD-10-CM | POA: Diagnosis not present

## 2018-04-17 DIAGNOSIS — Z79811 Long term (current) use of aromatase inhibitors: Secondary | ICD-10-CM | POA: Diagnosis not present

## 2018-04-17 DIAGNOSIS — Z170 Estrogen receptor positive status [ER+]: Secondary | ICD-10-CM | POA: Diagnosis not present

## 2018-04-17 DIAGNOSIS — C50511 Malignant neoplasm of lower-outer quadrant of right female breast: Secondary | ICD-10-CM | POA: Diagnosis not present

## 2018-04-17 DIAGNOSIS — Z780 Asymptomatic menopausal state: Secondary | ICD-10-CM | POA: Diagnosis not present

## 2018-04-17 DIAGNOSIS — M8588 Other specified disorders of bone density and structure, other site: Secondary | ICD-10-CM | POA: Diagnosis not present

## 2018-04-20 DIAGNOSIS — C50511 Malignant neoplasm of lower-outer quadrant of right female breast: Secondary | ICD-10-CM | POA: Diagnosis not present

## 2018-04-20 DIAGNOSIS — Z170 Estrogen receptor positive status [ER+]: Secondary | ICD-10-CM | POA: Diagnosis not present

## 2018-04-20 DIAGNOSIS — Z79811 Long term (current) use of aromatase inhibitors: Secondary | ICD-10-CM | POA: Diagnosis not present

## 2018-04-26 IMAGING — MG MM PLC BREAST LOC DEV 1ST LESION INC*R*
8 of 9 series · 8 of 9 positions shown · non-contrast
Comparison: Previous exam(s).

CLINICAL DATA: 66-year-old female presenting for radioactive seed
localization prior to right breast lumpectomy.

EXAM:
MAMMOGRAPHIC GUIDED RADIOACTIVE SEED LOCALIZATION OF THE RIGHT
BREAST

[R CC (1 of 5)]
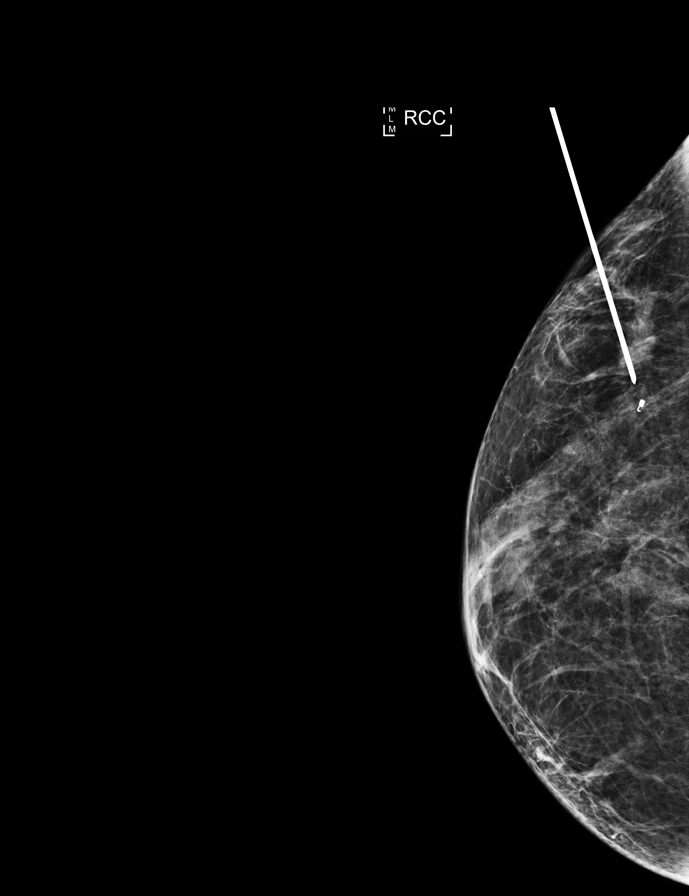

[R LM (1 of 3)]
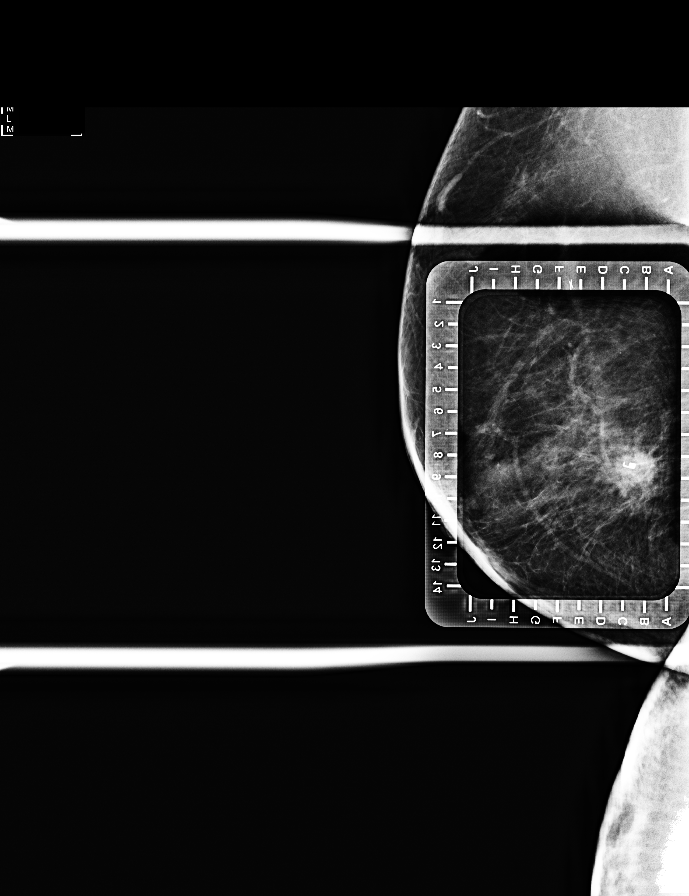

[R CC (2 of 5)]
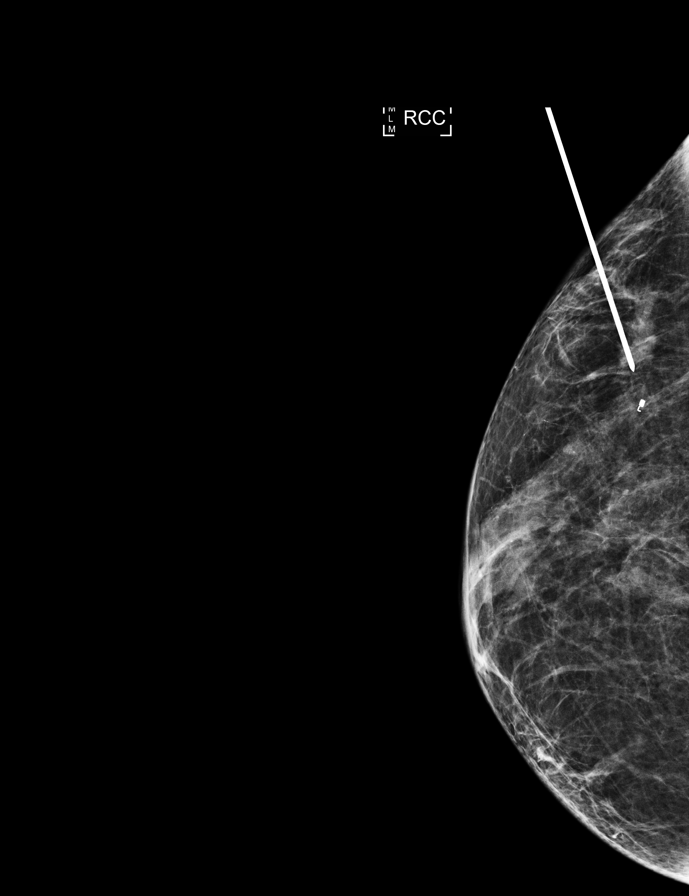

[R CC (3 of 5)]
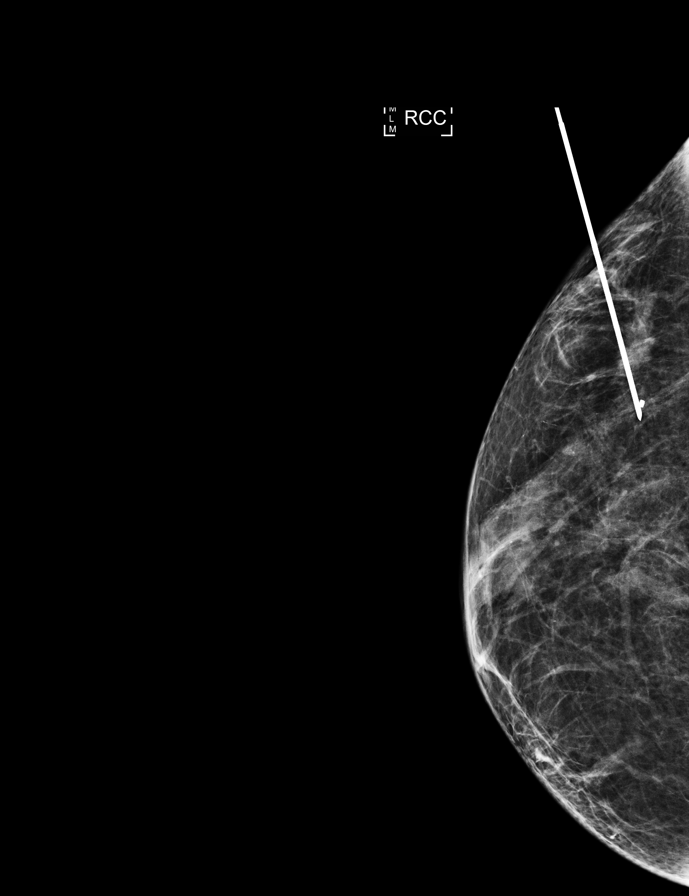

[R CC (4 of 5)]
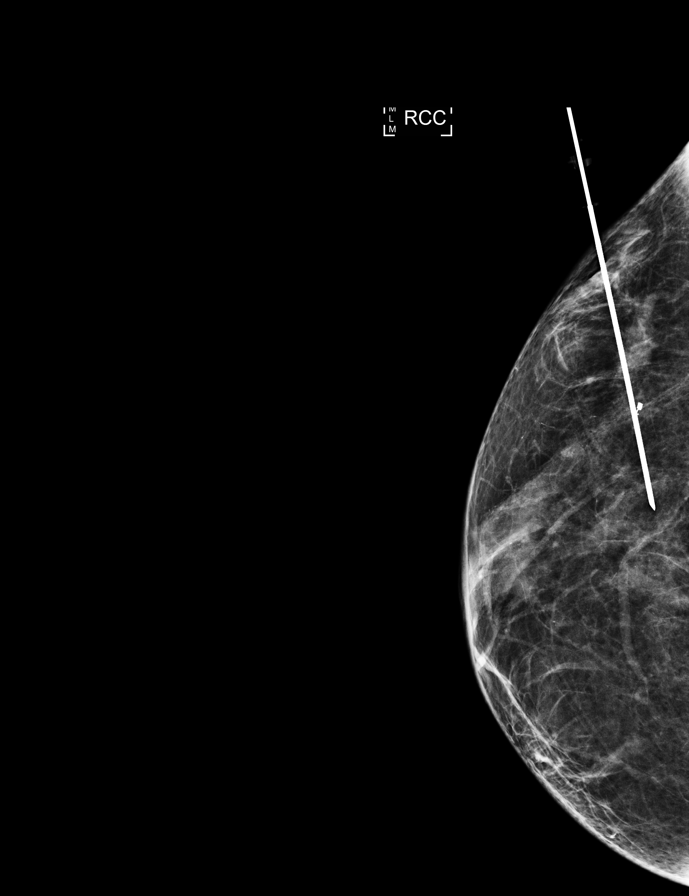

[R LM (2 of 3)]
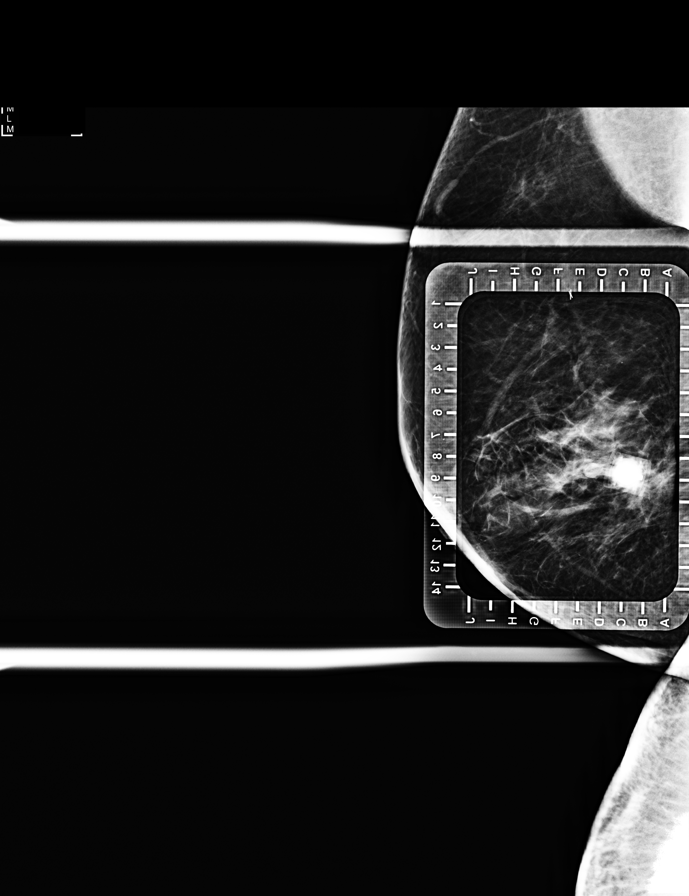

[R LM (3 of 3)]
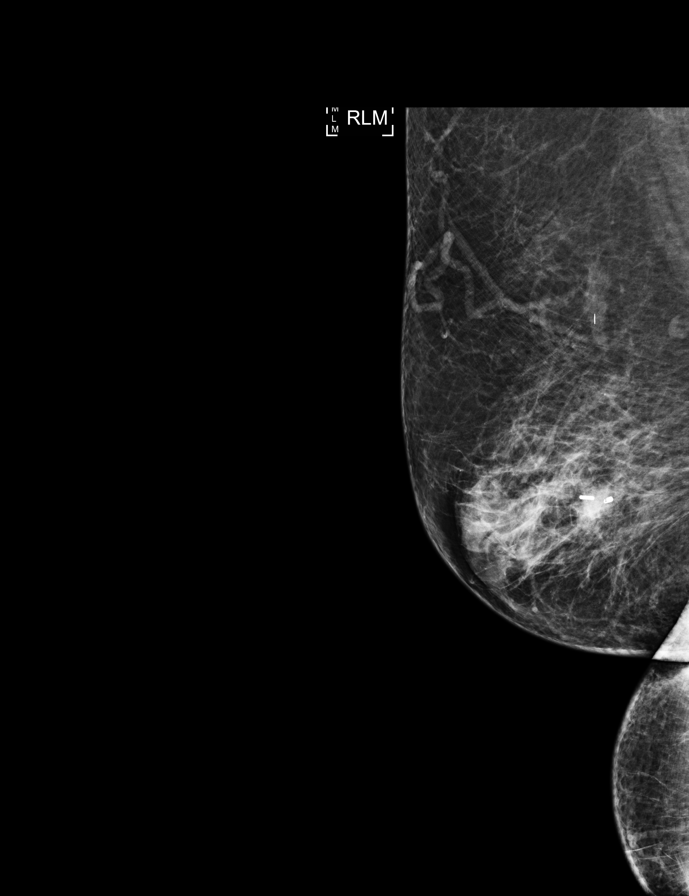

[R CC (5 of 5)]
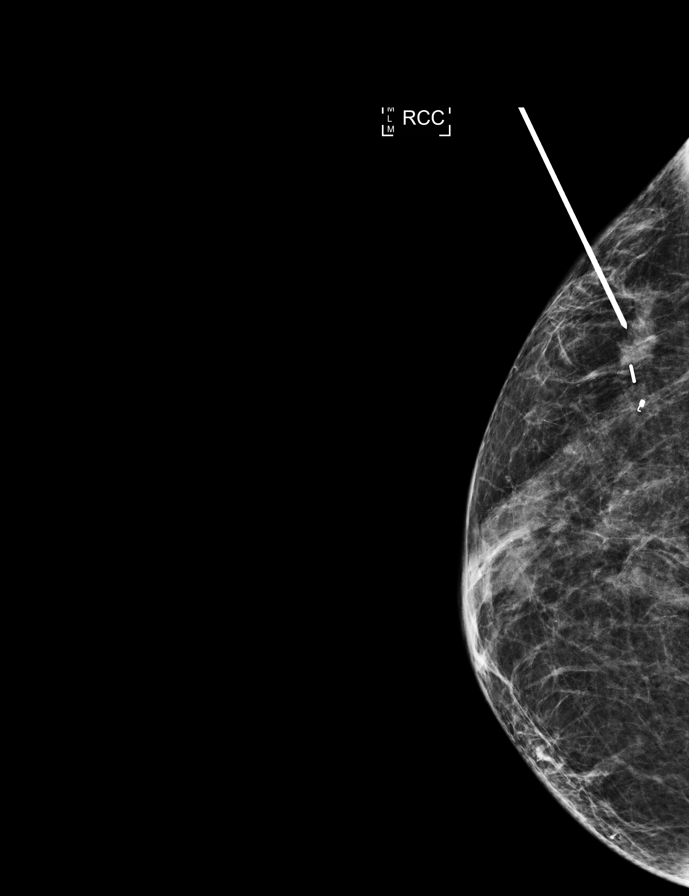

[8 of 9 positions shown; findings below may reference images not displayed]

FINDINGS: Patient presents for radioactive seed localization prior to right
breast lumpectomy. I met with the patient and we discussed the
procedure of seed localization including benefits and alternatives.
We discussed the high likelihood of a successful procedure. We
discussed the risks of the procedure including infection, bleeding,
tissue injury and further surgery. We discussed the low dose of
radioactivity involved in the procedure. Informed, written consent
was given.

The usual time-out protocol was performed immediately prior to the
procedure.

Using mammographic guidance, sterile technique, 1% lidocaine and an
7-UFG radioactive seed, the site of biopsy which is lateral to the
biopsy marking clip was localized using a lateral approach. The
follow-up mammogram images confirm the seed in the expected location
and were marked for Dr. Haljine. The seed is approximately 6-7 mm
lateral to the biopsy marking clip.

Follow-up survey of the patient confirms presence of the radioactive
seed.

Order number of 7-UFG seed:  [id number].

Total activity:  0.248 millicuries reference Date: [DATE]

The patient tolerated the procedure well and was released from the
[REDACTED]. She was given instructions regarding seed removal.
IMPRESSION: Radioactive seed localization right breast. The seed is
approximately 6-7 mm lateral to the biopsy marking clip. No apparent
complications.

## 2018-04-29 IMAGING — MG MM BREAST SURGICAL SPECIMEN
1 series · 1 of 1 positions shown · non-contrast
Comparison: Previous exam(s).

CLINICAL DATA: Post right breast lumpectomy.

EXAM:
SPECIMEN RADIOGRAPH OF THE RIGHT BREAST

[R]
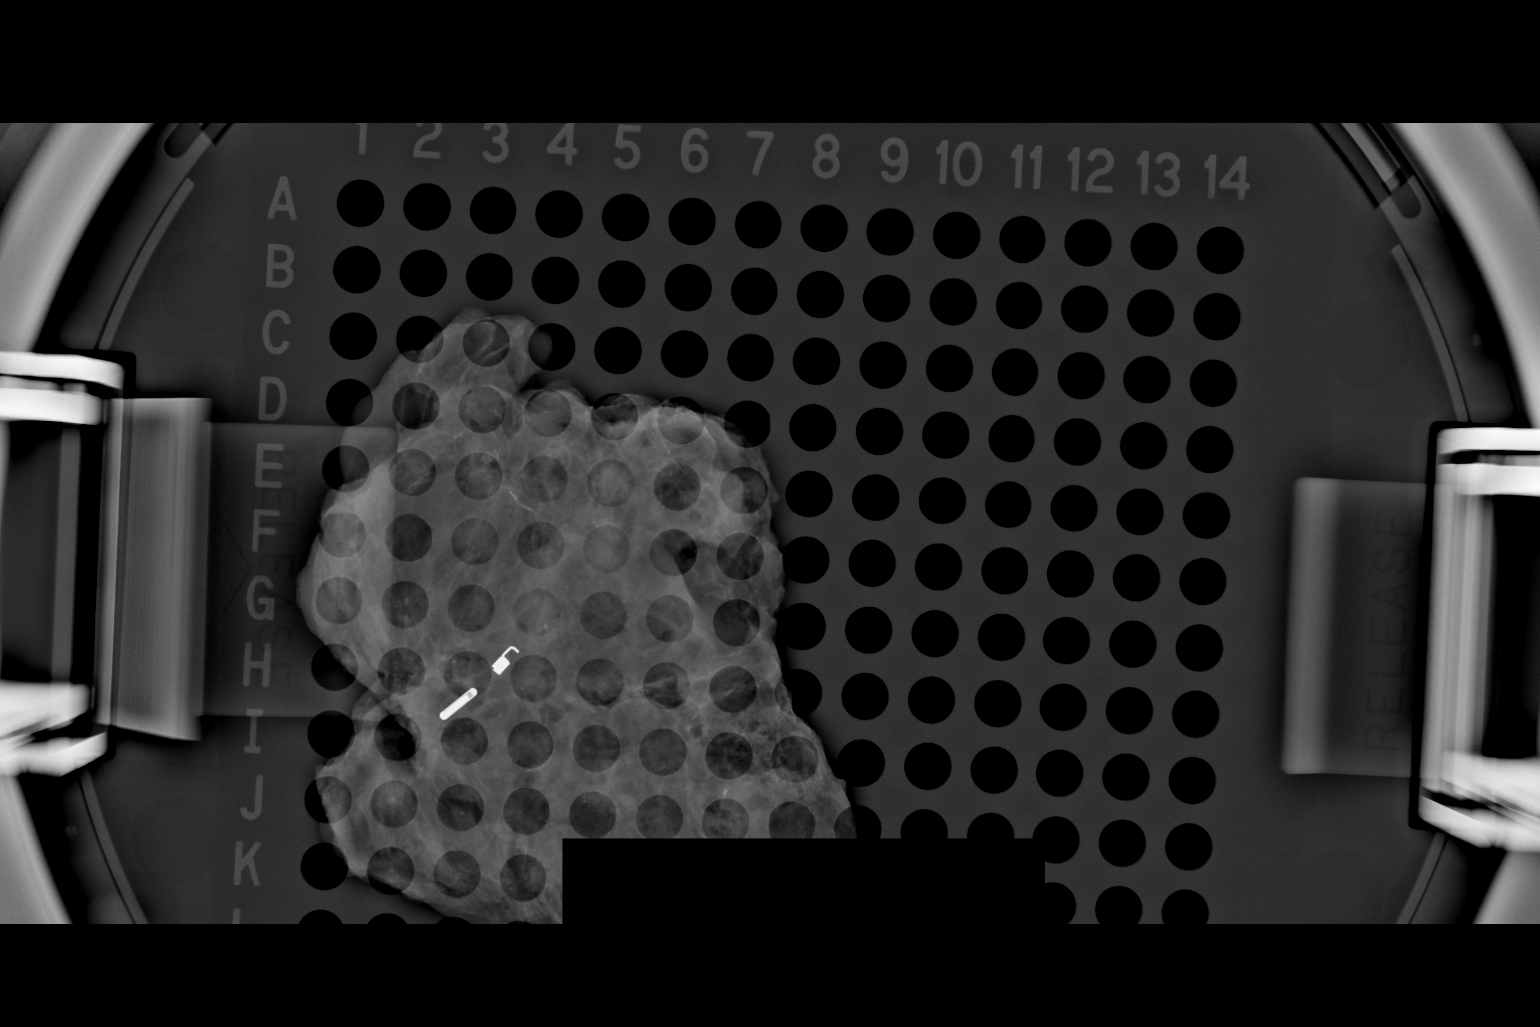

[1 of 1 positions shown; findings below may reference images not displayed]

FINDINGS: Status post excision of the right breast. The radioactive seed and
biopsy marker clip are present, completely intact, and were marked
for pathology.
IMPRESSION: Specimen radiograph of the right breast.

## 2018-05-12 DIAGNOSIS — C50511 Malignant neoplasm of lower-outer quadrant of right female breast: Secondary | ICD-10-CM | POA: Diagnosis not present

## 2018-06-22 DIAGNOSIS — E038 Other specified hypothyroidism: Secondary | ICD-10-CM | POA: Diagnosis not present

## 2018-06-22 DIAGNOSIS — E7849 Other hyperlipidemia: Secondary | ICD-10-CM | POA: Diagnosis not present

## 2018-06-22 DIAGNOSIS — E559 Vitamin D deficiency, unspecified: Secondary | ICD-10-CM | POA: Diagnosis not present

## 2018-06-22 DIAGNOSIS — R82998 Other abnormal findings in urine: Secondary | ICD-10-CM | POA: Diagnosis not present

## 2018-06-28 DIAGNOSIS — Z1389 Encounter for screening for other disorder: Secondary | ICD-10-CM | POA: Diagnosis not present

## 2018-06-28 DIAGNOSIS — J45998 Other asthma: Secondary | ICD-10-CM | POA: Diagnosis not present

## 2018-06-28 DIAGNOSIS — Z23 Encounter for immunization: Secondary | ICD-10-CM | POA: Diagnosis not present

## 2018-06-28 DIAGNOSIS — Z0000 Encounter for general adult medical examination without abnormal findings: Secondary | ICD-10-CM | POA: Diagnosis not present

## 2018-06-28 DIAGNOSIS — K635 Polyp of colon: Secondary | ICD-10-CM | POA: Diagnosis not present

## 2018-06-28 DIAGNOSIS — E559 Vitamin D deficiency, unspecified: Secondary | ICD-10-CM | POA: Diagnosis not present

## 2018-06-28 DIAGNOSIS — Z6830 Body mass index (BMI) 30.0-30.9, adult: Secondary | ICD-10-CM | POA: Diagnosis not present

## 2018-06-28 DIAGNOSIS — E7849 Other hyperlipidemia: Secondary | ICD-10-CM | POA: Diagnosis not present

## 2018-06-28 DIAGNOSIS — C50911 Malignant neoplasm of unspecified site of right female breast: Secondary | ICD-10-CM | POA: Diagnosis not present

## 2018-06-28 DIAGNOSIS — E038 Other specified hypothyroidism: Secondary | ICD-10-CM | POA: Diagnosis not present

## 2018-06-28 DIAGNOSIS — M859 Disorder of bone density and structure, unspecified: Secondary | ICD-10-CM | POA: Diagnosis not present

## 2018-06-30 DIAGNOSIS — Z1212 Encounter for screening for malignant neoplasm of rectum: Secondary | ICD-10-CM | POA: Diagnosis not present

## 2018-08-23 DIAGNOSIS — L723 Sebaceous cyst: Secondary | ICD-10-CM | POA: Diagnosis not present

## 2018-08-23 DIAGNOSIS — R300 Dysuria: Secondary | ICD-10-CM | POA: Diagnosis not present

## 2018-08-23 DIAGNOSIS — N952 Postmenopausal atrophic vaginitis: Secondary | ICD-10-CM | POA: Diagnosis not present

## 2018-08-23 DIAGNOSIS — K210 Gastro-esophageal reflux disease with esophagitis: Secondary | ICD-10-CM | POA: Diagnosis not present

## 2018-08-23 DIAGNOSIS — N905 Atrophy of vulva: Secondary | ICD-10-CM | POA: Diagnosis not present

## 2018-08-23 DIAGNOSIS — N3090 Cystitis, unspecified without hematuria: Secondary | ICD-10-CM | POA: Diagnosis not present

## 2018-08-23 DIAGNOSIS — Z01419 Encounter for gynecological examination (general) (routine) without abnormal findings: Secondary | ICD-10-CM | POA: Diagnosis not present

## 2018-08-23 DIAGNOSIS — Z853 Personal history of malignant neoplasm of breast: Secondary | ICD-10-CM | POA: Diagnosis not present

## 2018-09-04 DIAGNOSIS — Z853 Personal history of malignant neoplasm of breast: Secondary | ICD-10-CM | POA: Diagnosis not present

## 2018-09-04 DIAGNOSIS — R928 Other abnormal and inconclusive findings on diagnostic imaging of breast: Secondary | ICD-10-CM | POA: Diagnosis not present

## 2018-10-25 DIAGNOSIS — M859 Disorder of bone density and structure, unspecified: Secondary | ICD-10-CM | POA: Diagnosis not present

## 2018-10-25 DIAGNOSIS — C50511 Malignant neoplasm of lower-outer quadrant of right female breast: Secondary | ICD-10-CM | POA: Diagnosis not present

## 2018-10-25 DIAGNOSIS — Z170 Estrogen receptor positive status [ER+]: Secondary | ICD-10-CM | POA: Diagnosis not present

## 2018-10-25 DIAGNOSIS — R4586 Emotional lability: Secondary | ICD-10-CM | POA: Diagnosis not present

## 2018-10-25 DIAGNOSIS — K219 Gastro-esophageal reflux disease without esophagitis: Secondary | ICD-10-CM | POA: Diagnosis not present

## 2018-10-25 DIAGNOSIS — Z79811 Long term (current) use of aromatase inhibitors: Secondary | ICD-10-CM | POA: Diagnosis not present

## 2018-12-25 DIAGNOSIS — M8580 Other specified disorders of bone density and structure, unspecified site: Secondary | ICD-10-CM | POA: Diagnosis not present

## 2018-12-25 DIAGNOSIS — E669 Obesity, unspecified: Secondary | ICD-10-CM | POA: Diagnosis not present

## 2018-12-25 DIAGNOSIS — J45909 Unspecified asthma, uncomplicated: Secondary | ICD-10-CM | POA: Diagnosis not present

## 2018-12-25 DIAGNOSIS — E039 Hypothyroidism, unspecified: Secondary | ICD-10-CM | POA: Diagnosis not present

## 2018-12-25 DIAGNOSIS — E559 Vitamin D deficiency, unspecified: Secondary | ICD-10-CM | POA: Diagnosis not present

## 2018-12-25 DIAGNOSIS — E785 Hyperlipidemia, unspecified: Secondary | ICD-10-CM | POA: Diagnosis not present

## 2018-12-25 DIAGNOSIS — C50911 Malignant neoplasm of unspecified site of right female breast: Secondary | ICD-10-CM | POA: Diagnosis not present

## 2018-12-25 DIAGNOSIS — K635 Polyp of colon: Secondary | ICD-10-CM | POA: Diagnosis not present

## 2019-04-30 DIAGNOSIS — Z9011 Acquired absence of right breast and nipple: Secondary | ICD-10-CM | POA: Diagnosis not present

## 2019-04-30 DIAGNOSIS — K219 Gastro-esophageal reflux disease without esophagitis: Secondary | ICD-10-CM | POA: Diagnosis not present

## 2019-04-30 DIAGNOSIS — Z170 Estrogen receptor positive status [ER+]: Secondary | ICD-10-CM | POA: Diagnosis not present

## 2019-04-30 DIAGNOSIS — Z79811 Long term (current) use of aromatase inhibitors: Secondary | ICD-10-CM | POA: Diagnosis not present

## 2019-04-30 DIAGNOSIS — C50511 Malignant neoplasm of lower-outer quadrant of right female breast: Secondary | ICD-10-CM | POA: Diagnosis not present

## 2019-04-30 DIAGNOSIS — D229 Melanocytic nevi, unspecified: Secondary | ICD-10-CM | POA: Diagnosis not present

## 2019-04-30 DIAGNOSIS — Z923 Personal history of irradiation: Secondary | ICD-10-CM | POA: Diagnosis not present

## 2019-05-10 ENCOUNTER — Encounter: Payer: Self-pay | Admitting: Nurse Practitioner

## 2019-05-10 ENCOUNTER — Ambulatory Visit (INDEPENDENT_AMBULATORY_CARE_PROVIDER_SITE_OTHER): Payer: Medicare Other | Admitting: Nurse Practitioner

## 2019-05-10 ENCOUNTER — Other Ambulatory Visit: Payer: Self-pay

## 2019-05-10 VITALS — BP 100/70 | HR 78 | Temp 98.6°F | Ht 63.75 in | Wt 177.0 lb

## 2019-05-10 DIAGNOSIS — R140 Abdominal distension (gaseous): Secondary | ICD-10-CM

## 2019-05-10 DIAGNOSIS — K219 Gastro-esophageal reflux disease without esophagitis: Secondary | ICD-10-CM

## 2019-05-10 NOTE — Progress Notes (Signed)
ASSESSMENT / PLAN:    27. 68 year old female with GERD manifested as heartburn since May 2019.  She correlates this with initiation of Arimidex.  However, her last radiation treatment for breast cancer was in April so I wonder about about radiation esophagitis.  On Pepcid since December, it helps but she has refractory heartburn at night despite antireflux measures.  -Continue antireflux measures -Continue daily Pepcid for now. Dose adjustment may follow EGD. Of note she is intolerant to Omeprazole and Protonix- both caused nausea and vomiting -For further evaluation patient will be scheduled for EGD. The risks and benefits of EGD were discussed and the patient agrees to proceed.   2. Bloating. No weight gain. Artificial sweeteners could be contributing factor.  -Reassurance given that bloating is almost always a benign process. -Low gas diet given -Urged her to keep a food diary to try and correlate the bloating with certain foods  3. Colon cancer screening.  Recall colonoscopy due 2024   HPI:    Chief Complaint:  GERD   Victoria Pacheco is a 68 year old female previously followed by Dr. Sharlett Iles for history of adenomatous colon polyps.   Other past medical history significant for breast cancer, osteopenia, hyperlipidemia, and hypothyroidism.   Last colonoscopy November 2014.  Colon was very long, redundant and tortuous.  Hyperplastic polyp was removed from the rectum.  She is due for follow-up colonoscopy November 2024   Victoria Pacheco comes in for evaluation of heartburn which started May 2019 around the time she began taking Arimidex for breast cancer.  She tried taking Arimidex with food then without food but nothing made a difference.  Tums and Mylanta offered some relief.  She was then tried on Tagamet which worked well but she had difficulty finding it in a pharmacy.  She was then started on omeprazole but it caused nausea and vomiting.  She stopped omeprazole, rechallenged it with  the same side effects.  She was then started on pantoprazole and within days had nausea and vomiting again.  Following that, sometime in December 2019 she was started on Pepcid.  She has been on Pepcid since with significant improvement in heartburn except at night.  She sleeps on a wedge pillow, for the most part goes to bed on an empty stomach and consumes very little caffeine.  Despite these efforts she still has frequent heartburn without regurgitation at night.  Siren complains of bloating.  She is had to get a bra extender despite actually losing weight.  Bloating is worse as the day goes on.  She does use Splenda.    Past Medical History:  Diagnosis Date  . Allergic rhinitis   . Asthma    related to allergy to cockateil (bird)   . Cancer [facility])    Breast- stage one , stereotactic core biopsy - 08/2017 & again in 09/2017  . Diffuse cystic mastopathy   . Family history of breast cancer   . GERD (gastroesophageal reflux disease)   . Headache    hx of cluster headache , better since post menopausal  . History of kidney stones    passed spontaneously - fr. a dietary supplement   . Hx of colonic polyps   . Hypercholesteremia   . Hypothyroidism 1989   thyroid scan done with radio iodine   . Left knee injury   . Multiple lipomas   . Osteopenia   . Pneumonia    not  hosp. - treated with antibiotics ( several courses)   . Unspecified menopausal and postmenopausal disorder   . Vitamin D deficiency      Past Surgical History:  Procedure Laterality Date  . benign right breast biopsy  2002  . BREAST LUMPECTOMY WITH RADIOACTIVE SEED AND SENTINEL LYMPH NODE BIOPSY Right 10/10/2017   Procedure: BREAST LUMPECTOMY WITH RADIOACTIVE SEED AND SENTINEL LYMPH NODE BIOPSY;  Surgeon: Rolm Bookbinder, MD;  Location: [place];  Service: General;  Laterality: Right;  . TONSILLECTOMY AND ADENOIDECTOMY     at age 25  . WISDOM TOOTH EXTRACTION     as a teen   Family History  Problem Relation Age of  Onset  . Colon polyps Father 75  . Hypertension Father   . Alzheimer's disease Father        Deceased-95  . Breast cancer Mother 46       diagnosed second time at 54; Deceased-61  . Healthy Daughter   . Alzheimer's disease Paternal Aunt   . Throat cancer Paternal Uncle   . Lung cancer Paternal Uncle   . Prostate cancer Paternal Uncle   . Lung cancer Maternal Uncle   . Stroke Maternal Grandmother   . Bladder Cancer Maternal Grandfather   . Heart attack Paternal Grandmother   . Colon cancer Neg Hx    Social History   Tobacco Use  . Smoking status: Never Smoker  . Smokeless tobacco: Never Used  Substance Use Topics  . Alcohol use: Yes    Comment: social use  . Drug use: No   Current Outpatient Medications  Medication Sig Dispense Refill  . acetaminophen (TYLENOL) 650 MG CR tablet Take 650 mg by mouth daily as needed for pain.    Marland Kitchen anastrozole (ARIMIDEX) 1 MG tablet Take 1 mg by mouth daily.    . calcium carbonate (TUMS - DOSED IN MG ELEMENTAL CALCIUM) 500 MG chewable tablet Chew 1 tablet by mouth daily as needed for indigestion or heartburn.    . Cholecalciferol (VITAMIN D-3) 1000 UNITS CAPS Take 1,000 Units by mouth daily.     . famotidine (PEPCID) 20 MG tablet Take by mouth.    . fluticasone (FLONASE) 50 MCG/ACT nasal spray Place 1 spray into both nostrils daily as needed for allergies or rhinitis.    . Homeopathic Products (ARNICARE ARNICA) CREA Apply 1 application topically daily as needed (bruising).    Marland Kitchen levothyroxine (SYNTHROID, LEVOTHROID) 100 MCG tablet TAKE 1 TABLET BY MOUTH EVERY DAY (Patient taking differently: TAKE 1 TABLET BY MOUTH EVERY DAY   first thing in the a.m.) 30 tablet 0  . loratadine (CLARITIN) 10 MG tablet Take 10 mg by mouth daily as needed for allergies.    . sodium chloride (OCEAN) 0.65 % SOLN nasal spray Place 1 spray into both nostrils as needed for congestion.     No current facility-administered medications for this visit.    Allergies   Allergen Reactions  . Bee Venom Anaphylaxis  . Coconut Fatty Acids     Irritation of mouth,. Minimal swelling in tongue & throat  . Lipitor [Atorvastatin]     Cramps in hands   . Sulfamethoxazole-Trimethoprim Hives and Itching  . Adhesive [Tape] Rash     Review of Systems: All systems reviewed and negative except where noted in HPI.   Physical Exam:    Wt Readings from Last 3 Encounters:  05/10/19 177 lb (80.3 kg)  10/10/17 171 lb (77.6 kg)  10/05/17 171 lb 9.6 oz (77.8 kg)  BP 100/70   Pulse 78   Temp 98.6 F (37 C)   Ht 5' 3.75" (1.619 m)   Wt 177 lb (80.3 kg)   BMI 30.62 kg/m  Constitutional:  Pleasant female in no acute distress. Psychiatric: Normal mood and affect. Behavior is normal. EENT: Pupils normal.  Conjunctivae are normal. No scleral icterus. Neck supple.  Cardiovascular: Normal rate, regular rhythm. No edema Pulmonary/chest: Effort normal and breath sounds normal. No wheezing, rales or rhonchi. Abdominal: Soft, nondistended, nontender. Bowel sounds active throughout. There are no masses palpable. No hepatomegaly. Neurological: Alert and oriented to person place and time. Skin: Skin is warm and dry. No rashes noted.  Tye Savoy, NP  05/10/2019, 10:50 AM

## 2019-05-10 NOTE — Patient Instructions (Signed)
If you are age 67 or older, your body mass index should be between 23-30. Your Body mass index is 30.62 kg/m. If this is out of the aforementioned range listed, please consider follow up with your Primary Care Provider.  If you are age 16 or younger, your body mass index should be between 19-25. Your Body mass index is 30.62 kg/m. If this is out of the aformentioned range listed, please consider follow up with your Primary Care Provider.   You have been scheduled for an endoscopy. Please follow written instructions given to you at your visit today. If you use inhalers (even only as needed), please bring them with you on the day of your procedure. Your physician has requested that you go to www.startemmi.com and enter the access code given to you at your visit today. This web site gives a general overview about your procedure. However, you should still follow specific instructions given to you by our office regarding your preparation for the procedure.  You have been given a Low Fat Diet.  Thank you for choosing me and [facility].   Tye Savoy, NP

## 2019-05-22 NOTE — Progress Notes (Signed)
Agree with the assessment and plan as outlined by Rubbie Battiest, NP. Will plan for EGD to evaluate for reflux changes, LES laxity, HH, etc, along with evaluation for UGI etiology for bloating, to include duodenal bxs.   Iley Deignan, DO, [facility]

## 2019-05-29 ENCOUNTER — Encounter: Payer: Medicare Other | Admitting: Gastroenterology

## 2019-06-13 ENCOUNTER — Telehealth: Payer: Self-pay

## 2019-06-13 NOTE — Telephone Encounter (Signed)
Covid-19 screening questions   Do you now or have you had a fever in the last 14 days? NO   Do you have any respiratory symptoms of shortness of breath or cough now or in the last 14 days? NO  Do you have any family members or close contacts with diagnosed or suspected Covid-19 in the past 14 days? NO  Have you been tested for Covid-19 and found to be positive? NO        

## 2019-06-14 ENCOUNTER — Encounter: Payer: Self-pay | Admitting: Gastroenterology

## 2019-06-14 ENCOUNTER — Ambulatory Visit (AMBULATORY_SURGERY_CENTER): Payer: Medicare Other | Admitting: Gastroenterology

## 2019-06-14 ENCOUNTER — Other Ambulatory Visit: Payer: Self-pay | Admitting: Gastroenterology

## 2019-06-14 ENCOUNTER — Other Ambulatory Visit: Payer: Self-pay

## 2019-06-14 VITALS — BP 151/82 | HR 66 | Temp 98.8°F | Resp 17 | Ht 63.75 in | Wt 177.0 lb

## 2019-06-14 DIAGNOSIS — K317 Polyp of stomach and duodenum: Secondary | ICD-10-CM | POA: Diagnosis not present

## 2019-06-14 DIAGNOSIS — K219 Gastro-esophageal reflux disease without esophagitis: Secondary | ICD-10-CM | POA: Diagnosis not present

## 2019-06-14 DIAGNOSIS — Q398 Other congenital malformations of esophagus: Secondary | ICD-10-CM

## 2019-06-14 DIAGNOSIS — K2970 Gastritis, unspecified, without bleeding: Secondary | ICD-10-CM

## 2019-06-14 DIAGNOSIS — K449 Diaphragmatic hernia without obstruction or gangrene: Secondary | ICD-10-CM | POA: Diagnosis not present

## 2019-06-14 DIAGNOSIS — R140 Abdominal distension (gaseous): Secondary | ICD-10-CM

## 2019-06-14 MED ORDER — SODIUM CHLORIDE 0.9 % IV SOLN: 500.0000 mL | Freq: Once | INTRAVENOUS | Status: DC

## 2019-06-14 NOTE — Progress Notes (Signed)
[place]

## 2019-06-14 NOTE — Op Note (Signed)
[facility] Patient Name: Victoria Pacheco Procedure Date: 06/14/2019 9:36 AM MRN: [id number] Endoscopist: Gerrit Heck , MD Age: 68 Referring MD:  Date of Birth: 11-18-1950 Gender: Female Account #: [id number] Procedure:                Upper GI endoscopy Indications:              Heartburn, Suspected esophageal reflux, Abdominal                            bloating                           68 yo female with reflux (heartburn, regurgitation)                            and continued breakthrough symptoms despite Pepcid.                            Has been intolerant to omeprazole and pantoprazole                            in the past (nausea, vomiting). Additionally, she                            endorses abdominal bloating. No dysphagia. Medicines:                Monitored Anesthesia Care Procedure:                Pre-Anesthesia Assessment:                           - Prior to the procedure, a History and Physical                            was performed, and patient medications and                            allergies were reviewed. The patient's tolerance of                            previous anesthesia was also reviewed. The risks                            and benefits of the procedure and the sedation                            options and risks were discussed with the patient.                            All questions were answered, and informed consent                            was obtained. Prior Anticoagulants: The patient has  taken no previous anticoagulant or antiplatelet                            agents. ASA Grade Assessment: II - A patient with                            mild systemic disease. After reviewing the risks                            and benefits, the patient was deemed in                            satisfactory condition to undergo the procedure.                           After obtaining informed consent, the endoscope  was                            passed under direct vision. Throughout the                            procedure, the patient's blood pressure, pulse, and                            oxygen saturations were monitored continuously. The                            Endoscope was introduced through the mouth, and                            advanced to the second part of duodenum. The upper                            GI endoscopy was accomplished without difficulty.                            The patient tolerated the procedure well. Scope In: Scope Out: Findings:                 Esophagogastric landmarks were identified: the                            gastroesophageal junction was found at 35 cm and                            the site of hiatal narrowing was found at 39 cm                            from the incisors.                           A 4 cm hiatal hernia was present.  The Z-line was irregular and was found 35 cm from                            the incisors. There was a single small island of                            salmon colored mucosa located immediately proximal                            to the Z line. Multiple mucosal biopsies were taken                            from the salmon colored island and Z line with a                            cold forceps for histology. Estimated blood loss                            was minimal.                           A single area of ectopic gastric mucosa was found                            in the upper third of the esophagus.                           Multiple 2 to 6 mm sessile polyps with no stigmata                            of recent bleeding were found in the gastric fundus                            and in the gastric body. Several of these polyps                            were removed with a cold biopsy forceps for                            representative histologic evaluation. Resection and                             retrieval were complete. Estimated blood loss was                            minimal.                           The incisura, gastric antrum and pylorus were                            normal.  The duodenal bulb, first portion of the duodenum                            and second portion of the duodenum were normal.                            Biopsies for histology were taken with a cold                            forceps for evaluation of celiac disease. Estimated                            blood loss was minimal. Complications:            No immediate complications. Estimated Blood Loss:     Estimated blood loss was minimal. Impression:               - Esophagogastric landmarks identified.                           - 4 cm hiatal hernia.                           - Z-line irregular, 35 cm from the incisors.                            Biopsied.                           - Ectopic gastric mucosa in the upper third of the                            esophagus.                           - Multiple gastric polyps. Resected and retrieved.                           - Normal incisura, antrum and pylorus.                           - Normal duodenal bulb, first portion of the                            duodenum and second portion of the duodenum.                            Biopsied. Recommendation:           - Patient has a contact number available for                            emergencies. The signs and symptoms of potential                            delayed complications were discussed with the  patient. Return to normal activities tomorrow.                            Written discharge instructions were provided to the                            patient.                           - Resume previous diet.                           - Continue present medications.                           - Await pathology results.                            - Increase Pepcid to 20 mg BID for diagnostic and                            therapeutic intent.                           - Return to GI clinic at appointment to be                            scheduled.                           - If ongoing reflux symptoms despite higher dose                            H2RA, may consider either new PPI therapy                            (esomeprazole or dexlansoprazole) vs further                            evaluation with pH/Impedance testing. Given the                            presence of a large hiatal hernia, could also                            consider hernia repair and atireflux surgical                            options depending on response to therapy. Gerrit Heck, MD 06/14/2019 10:01:18 AM

## 2019-06-14 NOTE — Progress Notes (Signed)
PT taken to PACU. Monitors in place. VSS. Report given to RN. 

## 2019-06-14 NOTE — Progress Notes (Signed)
Called to room to assist during endoscopic procedure.  Patient ID and intended procedure confirmed with present staff. Received instructions for my participation in the procedure from the performing physician.  

## 2019-06-14 NOTE — Patient Instructions (Signed)
INCREASE PEPCID TO TWICE DAILY.  HANDOUT HIATAL HERNIA   YOU HAD AN ENDOSCOPIC PROCEDURE TODAY AT [facility]:   Refer to the procedure report that was given to you for any specific questions about what was found during the examination.  If the procedure report does not answer your questions, please call your gastroenterologist to clarify.  If you requested that your care partner not be given the details of your procedure findings, then the procedure report has been included in a sealed envelope for you to review at your convenience later.  YOU SHOULD EXPECT: Some feelings of bloating in the abdomen. Passage of more gas than usual.  Walking can help get rid of the air that was put into your GI tract during the procedure and reduce the bloating. If you had a lower endoscopy (such as a colonoscopy or flexible sigmoidoscopy) you may notice spotting of blood in your stool or on the toilet paper. If you underwent a bowel prep for your procedure, you may not have a normal bowel movement for a few days.  Please Note:  You might notice some irritation and congestion in your nose or some drainage.  This is from the oxygen used during your procedure.  There is no need for concern and it should clear up in a day or so.  SYMPTOMS TO REPORT IMMEDIATELY:    Following upper endoscopy (EGD)  Vomiting of blood or coffee ground material  New chest pain or pain under the shoulder blades  Painful or persistently difficult swallowing  New shortness of breath  Fever of 100F or higher  Black, tarry-looking stools  For urgent or emergent issues, a gastroenterologist can be reached at any hour by calling [phone].   DIET:  We do recommend a small meal at first, but then you may proceed to your regular diet.  Drink plenty of fluids but you should avoid alcoholic beverages for 24 hours.  ACTIVITY:  You should plan to take it easy for the rest of today and you should NOT DRIVE or use heavy  machinery until tomorrow (because of the sedation medicines used during the test).    FOLLOW UP: Our staff will call the number listed on your records 48-72 hours following your procedure to check on you and address any questions or concerns that you may have regarding the information given to you following your procedure. If we do not reach you, we will leave a message.  We will attempt to reach you two times.  During this call, we will ask if you have developed any symptoms of COVID 19. If you develop any symptoms (ie: fever, flu-like symptoms, shortness of breath, cough etc.) before then, please call [phone].  If you test positive for Covid 19 in the 2 weeks post procedure, please call and report this information to [place].    If any biopsies were taken you will be contacted by phone or by letter within the next 1-3 weeks.  Please call us at [phone] if you have not heard about the biopsies in 3 weeks.    SIGNATURES/CONFIDENTIALITY: You and/or your care partner have signed paperwork which will be entered into your electronic medical record.  These signatures attest to the fact that that the information above on your After Visit Summary has been reviewed and is understood.  Full responsibility of the confidentiality of this discharge information lies with you and/or your care-partner.

## 2019-06-15 DIAGNOSIS — Z23 Encounter for immunization: Secondary | ICD-10-CM | POA: Diagnosis not present

## 2019-06-18 ENCOUNTER — Telehealth: Payer: Self-pay | Admitting: *Deleted

## 2019-06-18 NOTE — Telephone Encounter (Signed)
  Follow up Call-  Call back number 06/14/2019  Post procedure Call Back phone  # [phone]  Permission to leave phone message Yes  Some recent data might be hidden     Patient questions:  Do you have a fever, pain , or abdominal swelling? No. Pain Score  0 *  Have you tolerated food without any problems? Yes.    Have you been able to return to your normal activities? Yes.    Do you have any questions about your discharge instructions: Diet   No. Medications  No. Follow up visit  No.  Do you have questions or concerns about your Care? No.  Actions: * If pain score is 4 or above: No action needed, pain <4.  1. Have you developed a fever since your procedure? no  2.   Have you had an respiratory symptoms (SOB or cough) since your procedure? no  3.   Have you tested positive for COVID 19 since your procedure no  4.   Have you had any family members/close contacts diagnosed with the COVID 19 since your procedure?  no   If yes to any of these questions please route to Joylene John, RN and Alphonsa Gin, Therapist, sports.

## 2019-06-18 NOTE — Telephone Encounter (Signed)
  Follow up Call-  Call back number 06/14/2019  Post procedure Call Back phone  # [phone]  Permission to leave phone message Yes  Some recent data might be hidden     Patient questions:  Message left to call us if necessary.

## 2019-06-19 ENCOUNTER — Encounter: Payer: Self-pay | Admitting: Gastroenterology

## 2019-06-29 DIAGNOSIS — M859 Disorder of bone density and structure, unspecified: Secondary | ICD-10-CM | POA: Diagnosis not present

## 2019-06-29 DIAGNOSIS — E559 Vitamin D deficiency, unspecified: Secondary | ICD-10-CM | POA: Diagnosis not present

## 2019-06-29 DIAGNOSIS — E7849 Other hyperlipidemia: Secondary | ICD-10-CM | POA: Diagnosis not present

## 2019-06-29 DIAGNOSIS — E038 Other specified hypothyroidism: Secondary | ICD-10-CM | POA: Diagnosis not present

## 2019-07-04 DIAGNOSIS — R82998 Other abnormal findings in urine: Secondary | ICD-10-CM | POA: Diagnosis not present

## 2019-07-06 DIAGNOSIS — Z1331 Encounter for screening for depression: Secondary | ICD-10-CM | POA: Diagnosis not present

## 2019-07-06 DIAGNOSIS — C50911 Malignant neoplasm of unspecified site of right female breast: Secondary | ICD-10-CM | POA: Diagnosis not present

## 2019-07-06 DIAGNOSIS — N1832 Chronic kidney disease, stage 3b: Secondary | ICD-10-CM | POA: Diagnosis not present

## 2019-07-06 DIAGNOSIS — Z0000 Encounter for general adult medical examination without abnormal findings: Secondary | ICD-10-CM | POA: Diagnosis not present

## 2019-07-06 DIAGNOSIS — J45909 Unspecified asthma, uncomplicated: Secondary | ICD-10-CM | POA: Diagnosis not present

## 2019-07-06 DIAGNOSIS — E039 Hypothyroidism, unspecified: Secondary | ICD-10-CM | POA: Diagnosis not present

## 2019-07-06 DIAGNOSIS — E785 Hyperlipidemia, unspecified: Secondary | ICD-10-CM | POA: Diagnosis not present

## 2019-07-06 DIAGNOSIS — K635 Polyp of colon: Secondary | ICD-10-CM | POA: Diagnosis not present

## 2019-07-06 DIAGNOSIS — E559 Vitamin D deficiency, unspecified: Secondary | ICD-10-CM | POA: Diagnosis not present

## 2019-07-06 DIAGNOSIS — M8580 Other specified disorders of bone density and structure, unspecified site: Secondary | ICD-10-CM | POA: Diagnosis not present

## 2019-07-06 DIAGNOSIS — R300 Dysuria: Secondary | ICD-10-CM | POA: Diagnosis not present

## 2019-07-10 DIAGNOSIS — Z1212 Encounter for screening for malignant neoplasm of rectum: Secondary | ICD-10-CM | POA: Diagnosis not present

## 2019-07-24 ENCOUNTER — Ambulatory Visit (INDEPENDENT_AMBULATORY_CARE_PROVIDER_SITE_OTHER): Payer: Medicare Other | Admitting: Gastroenterology

## 2019-07-24 ENCOUNTER — Encounter: Payer: Self-pay | Admitting: Gastroenterology

## 2019-07-24 ENCOUNTER — Other Ambulatory Visit: Payer: Self-pay

## 2019-07-24 VITALS — BP 120/76 | HR 84 | Temp 97.9°F | Ht 64.0 in | Wt 178.4 lb

## 2019-07-24 DIAGNOSIS — R140 Abdominal distension (gaseous): Secondary | ICD-10-CM

## 2019-07-24 DIAGNOSIS — K219 Gastro-esophageal reflux disease without esophagitis: Secondary | ICD-10-CM

## 2019-07-24 DIAGNOSIS — K449 Diaphragmatic hernia without obstruction or gangrene: Secondary | ICD-10-CM | POA: Diagnosis not present

## 2019-07-24 DIAGNOSIS — K5900 Constipation, unspecified: Secondary | ICD-10-CM

## 2019-07-24 NOTE — Progress Notes (Signed)
P  Chief Complaint:    GERD, abdominal bloating  GI History:  1) GERD: Index symptoms of heartburn.  Started in 01/2018.  Started around the time of initiating Arimidex.  Some improvement with Tums, Mylanta, Tagamet.  Started on Pepcid in 08/2018, but breakthrough heartburn.  Intolerant to omeprazole and Protonix (nausea/vomiting).  Sleeps with HOB elevated, avoids eating close to bedtime, consumes very little caffeine, but still nocturnal heartburn without regurgitation.  2) Bloating: Much improved with probiotic.  Endoscopic history: -Colonoscopy (07/2013): Redundant, tortuous.  Rectal hyperplastic polyp.  Repeat in 07/2023. -EGD (06/2019, Dr. Bryan Lemma): 4 cm HH, irregular Z-line with salmon-colored island (biopsy: Reflux inflammation without Barrett's), gastric inlet patch, fundic gland polyps,  HPI:    Patient is a 68 y.o. female presenting to the Gastroenterology Clinic for follow-up.  Initially seen by Tye Savoy in 05/2019 for evaluation of heartburn since 01/2018, with EGD completed by me last month as outlined above.  Increased Pepcid to 20 mg bid at that time.    Today, she states she has had improvement since increasing to bid dosing.  She is concerned about ongoing acid suppression therapy, both potential ADR profile and just the idea of taking medication long-term.  Does have intermittent constipation since taking calcium.  Described as straining and hard stools.  No hematochezia or melena.  She is otherwise without any additional complaints today.  Review of systems:     No chest pain, no SOB, no fevers, no urinary sx   Past Medical History:  Diagnosis Date  . Allergic rhinitis   . Asthma    related to allergy to cockateil (bird)   . Cancer [facility])    Breast- stage one , stereotactic core biopsy - 08/2017 & again in 09/2017  . Diffuse cystic mastopathy   . Family history of breast cancer   . GERD (gastroesophageal reflux disease)   . Headache    hx of cluster  headache , better since post menopausal  . History of kidney stones    passed spontaneously - fr. a dietary supplement   . Hx of colonic polyps   . Hypercholesteremia   . Hypothyroidism 1989   thyroid scan done with radio iodine   . Left knee injury   . Multiple lipomas   . Osteopenia   . Pneumonia    not hosp. - treated with antibiotics ( several courses)   . Unspecified menopausal and postmenopausal disorder   . Vitamin D deficiency     Patient's surgical history, family medical history, social history, medications and allergies were all reviewed in Epic    Current Outpatient Medications  Medication Sig Dispense Refill  . acetaminophen (TYLENOL) 650 MG CR tablet Take 650 mg by mouth daily as needed for pain.    Marland Kitchen anastrozole (ARIMIDEX) 1 MG tablet Take 1 mg by mouth daily.    . calcium carbonate (TUMS - DOSED IN MG ELEMENTAL CALCIUM) 500 MG chewable tablet Chew 1 tablet by mouth daily as needed for indigestion or heartburn.    . Cholecalciferol (VITAMIN D-3) 1000 UNITS CAPS Take 1,000 Units by mouth daily.     . famotidine (PEPCID) 20 MG tablet Take by mouth 2 (two) times daily.     . fluticasone (FLONASE) 50 MCG/ACT nasal spray Place 1 spray into both nostrils daily as needed for allergies or rhinitis.    . Homeopathic Products (ARNICARE ARNICA) CREA Apply 1 application topically daily as needed (bruising).    Marland Kitchen levothyroxine (SYNTHROID, LEVOTHROID) 100  MCG tablet TAKE 1 TABLET BY MOUTH EVERY DAY (Patient taking differently: TAKE 1 TABLET BY MOUTH EVERY DAY   first thing in the a.m.) 30 tablet 0  . loratadine (CLARITIN) 10 MG tablet Take 10 mg by mouth daily as needed for allergies.    . Probiotic Product (ALIGN PO) Take 1 tablet by mouth daily.    . sodium chloride (OCEAN) 0.65 % SOLN nasal spray Place 1 spray into both nostrils as needed for congestion.    . Multiple Vitamin (MULTI-VITAMIN) tablet Take 1 tablet by mouth as needed.     No current facility-administered  medications for this visit.     Physical Exam:     BP 120/76   Pulse 84   Temp 97.9 F (36.6 C)   Ht 5\' 4"  (1.626 m)   Wt 178 lb 6 oz (80.9 kg)   BMI 30.62 kg/m   GENERAL:  Pleasant female in NAD PSYCH: : Cooperative, normal affect EENT:  conjunctiva pink, mucous membranes moist, neck supple without masses CARDIAC:  RRR, no murmur heard, no peripheral edema PULM: Normal respiratory effort, lungs CTA bilaterally, no wheezing ABDOMEN:  Nondistended, soft, nontender. No obvious masses, no hepatomegaly,  normal bowel sounds SKIN:  turgor, no lesions seen Musculoskeletal:  Normal muscle tone, normal strength NEURO: Alert and oriented x 3, no focal neurologic deficits   IMPRESSION and PLAN:    1) GERD 2) Hiatal hernia  Reflux symptoms well controlled on Pepcid 20 mg bid.  Discussed treatment options at length today, to include medical management, cTIF, lap HH repair/partial Nissen, etc. and will proceed as below: - Decrease Pepcid to 10 mg BID to titrate to lowest effective dose -If planning HH repair/TIF, would need pH/Mii/EM -Discussed ADR profile of Arimidex at length today.  While not specifically reflux, label with "GI distress" in 29-34%, dyspepsia 7%, GI disease 7%. -Resume antireflux lifestyle/dietary modifications -Extensively discussed antireflux surgical options at length today, to include post operative dietary/activity restrictions  3) Constipation Associated with calcium use. -Start fiber supplement -Provided with fiber chart increase dietary fiber -Ensure adequate hydration -If incomplete response, can add MiraLAX  4) Bloating: -Resolved with probiotic  -RTC in 3 months or sooner as needed  I spent a total of 25 minutes of face-to-face time with the patient. Greater than 50% of the time was spent counseling and coordinating care.       Lavena Bullion ,DO, FACG 07/24/2019, 10:42 AM

## 2019-07-24 NOTE — Patient Instructions (Addendum)
Please start taking a daily fiber supplement such as citrucel, benefiber, metamucil, or fiber choice.   Please purchase the following medications over the counter and take as directed: Pepcid 10 mg twice daily  Return in 3 months  It was a pleasure to see you today!  Vito Cirigliano, D.O.

## 2019-09-14 DIAGNOSIS — R350 Frequency of micturition: Secondary | ICD-10-CM | POA: Diagnosis not present

## 2019-09-14 DIAGNOSIS — Z170 Estrogen receptor positive status [ER+]: Secondary | ICD-10-CM | POA: Diagnosis not present

## 2019-09-14 DIAGNOSIS — Z01419 Encounter for gynecological examination (general) (routine) without abnormal findings: Secondary | ICD-10-CM | POA: Diagnosis not present

## 2019-09-14 DIAGNOSIS — C50511 Malignant neoplasm of lower-outer quadrant of right female breast: Secondary | ICD-10-CM | POA: Diagnosis not present

## 2019-09-14 DIAGNOSIS — N952 Postmenopausal atrophic vaginitis: Secondary | ICD-10-CM | POA: Diagnosis not present

## 2019-09-28 ENCOUNTER — Ambulatory Visit: Payer: Medicare Other | Attending: Internal Medicine

## 2019-09-28 DIAGNOSIS — Z23 Encounter for immunization: Secondary | ICD-10-CM | POA: Insufficient documentation

## 2019-09-28 NOTE — Progress Notes (Signed)
   Covid-19 Vaccination Clinic  Name:  Victoria Pacheco    MRN: [id number] DOB: 02-19-1951  09/28/2019  [name] was observed post Covid-19 immunization for 15 minutes without incidence. She was provided with Vaccine Information Sheet and instruction to access the V-Safe system.   [name] was instructed to call 911 with any severe reactions post vaccine: Marland Kitchen Difficulty breathing  . Swelling of your face and throat  . A fast heartbeat  . A bad rash all over your body  . Dizziness and weakness    Immunizations Administered    Name Date Dose VIS Date Route   Pfizer COVID-19 Vaccine 09/28/2019  6:17 PM 0.3 mL 08/17/2019 Intramuscular   Manufacturer: [place]   Lot: [id number]   [place]: [id number]

## 2019-10-03 DIAGNOSIS — Z853 Personal history of malignant neoplasm of breast: Secondary | ICD-10-CM | POA: Diagnosis not present

## 2019-10-03 DIAGNOSIS — D219 Benign neoplasm of connective and other soft tissue, unspecified: Secondary | ICD-10-CM | POA: Diagnosis not present

## 2019-10-03 DIAGNOSIS — R140 Abdominal distension (gaseous): Secondary | ICD-10-CM | POA: Diagnosis not present

## 2019-10-19 ENCOUNTER — Ambulatory Visit: Payer: Medicare Other | Attending: Internal Medicine

## 2019-10-19 DIAGNOSIS — Z23 Encounter for immunization: Secondary | ICD-10-CM

## 2019-10-19 NOTE — Progress Notes (Signed)
   Covid-19 Vaccination Clinic  Name:  Victoria Pacheco    MRN: [id number] DOB: 1951-01-13  10/19/2019  [name] was observed post Covid-19 immunization for 15 minutes without incidence. She was provided with Vaccine Information Sheet and instruction to access the V-Safe system.   [name] was instructed to call 911 with any severe reactions post vaccine: Marland Kitchen Difficulty breathing  . Swelling of your face and throat  . A fast heartbeat  . A bad rash all over your body  . Dizziness and weakness    Immunizations Administered    Name Date Dose VIS Date Route   Pfizer COVID-19 Vaccine 10/19/2019  9:41 AM 0.3 mL 08/17/2019 Intramuscular   Manufacturer: [place]   Lot: [id number]   [place]: [id number]

## 2020-01-04 ENCOUNTER — Telehealth: Payer: Self-pay | Admitting: Internal Medicine

## 2020-01-04 DIAGNOSIS — E669 Obesity, unspecified: Secondary | ICD-10-CM | POA: Diagnosis not present

## 2020-01-04 DIAGNOSIS — I4891 Unspecified atrial fibrillation: Secondary | ICD-10-CM | POA: Diagnosis not present

## 2020-01-04 DIAGNOSIS — N1831 Chronic kidney disease, stage 3a: Secondary | ICD-10-CM | POA: Diagnosis not present

## 2020-01-04 DIAGNOSIS — J45909 Unspecified asthma, uncomplicated: Secondary | ICD-10-CM | POA: Diagnosis not present

## 2020-01-04 DIAGNOSIS — E559 Vitamin D deficiency, unspecified: Secondary | ICD-10-CM | POA: Diagnosis not present

## 2020-01-04 DIAGNOSIS — E7849 Other hyperlipidemia: Secondary | ICD-10-CM | POA: Diagnosis not present

## 2020-01-04 DIAGNOSIS — E038 Other specified hypothyroidism: Secondary | ICD-10-CM | POA: Diagnosis not present

## 2020-01-04 DIAGNOSIS — C50911 Malignant neoplasm of unspecified site of right female breast: Secondary | ICD-10-CM | POA: Diagnosis not present

## 2020-01-04 NOTE — Telephone Encounter (Signed)
New message  Patient is calling in stating that she is currently being seen by Dr. Reynold Bowen with [facility] and states that he has detected AFIB and spoke with Dr. Caryl Comes about it and states that she is supposed to be seen on Monday bu Dr. Caryl Comes. Please give patient a call back to assist with scheduling with Dr. Caryl Comes.

## 2020-01-06 DIAGNOSIS — I4891 Unspecified atrial fibrillation: Secondary | ICD-10-CM | POA: Insufficient documentation

## 2020-01-06 DIAGNOSIS — I480 Paroxysmal atrial fibrillation: Secondary | ICD-10-CM | POA: Insufficient documentation

## 2020-01-07 ENCOUNTER — Encounter: Payer: Self-pay | Admitting: Internal Medicine

## 2020-01-07 ENCOUNTER — Ambulatory Visit (INDEPENDENT_AMBULATORY_CARE_PROVIDER_SITE_OTHER): Payer: Medicare Other | Admitting: Internal Medicine

## 2020-01-07 ENCOUNTER — Other Ambulatory Visit: Payer: Self-pay

## 2020-01-07 DIAGNOSIS — I4891 Unspecified atrial fibrillation: Secondary | ICD-10-CM

## 2020-01-07 MED ORDER — APIXABAN 5 MG PO TABS: 5.0000 mg | ORAL_TABLET | Freq: Two times a day (BID) | ORAL | 3 refills | Status: DC

## 2020-01-07 NOTE — Progress Notes (Signed)
ELECTROPHYSIOLOGY CONSULT NOTE  Patient ID: Victoria Pacheco, MRN: [id number], DOB/AGE: 12-29-1950 69 y.o. Admit date: (Not on file) Date of Consult: 01/07/2020  Primary Physician: Reynold Bowen, MD Primary Cardiologist: new     Briannie Lagow is a 69 y.o. female who is being seen today for the evaluation of afib at the request of Dr Forde Dandy.    HPI Victoria Pacheco is a 69 y.o. female referred for serendipitously detected afib at her semiannual visit last week  No palps, no LH, no changes in exercise tolerance-- had had caffeine unusually the day before  About a month before she had been aware of tachypalps  Using pulse ox noted HR following her visit on Friday was 115 but the next day it was [id number]  Thromboembolic risk factors ( age -75, Gender-1) for a CHADSVASc Score of 2 No hx of bleeding   Past Medical History:  Diagnosis Date  . Allergic rhinitis   . Asthma    related to allergy to cockateil (bird)   . Cancer [facility])    Breast- stage one , stereotactic core biopsy - 08/2017 & again in 09/2017  . Diffuse cystic mastopathy   . Family history of breast cancer   . GERD (gastroesophageal reflux disease)   . Headache    hx of cluster headache , better since post menopausal  . History of kidney stones    passed spontaneously - fr. a dietary supplement   . Hx of colonic polyps   . Hypercholesteremia   . Hypothyroidism 1989   thyroid scan done with radio iodine   . Left knee injury   . Multiple lipomas   . Osteopenia   . Pneumonia    not hosp. - treated with antibiotics ( several courses)   . Unspecified menopausal and postmenopausal disorder   . Vitamin D deficiency       Surgical History:  Past Surgical History:  Procedure Laterality Date  . benign right breast biopsy  2002  . BREAST LUMPECTOMY WITH RADIOACTIVE SEED AND SENTINEL LYMPH NODE BIOPSY Right 10/10/2017   Procedure: BREAST LUMPECTOMY WITH RADIOACTIVE SEED AND SENTINEL LYMPH NODE BIOPSY;  Surgeon: Rolm Bookbinder, MD;  Location: [place];  Service: General;  Laterality: Right;  . TONSILLECTOMY AND ADENOIDECTOMY     at age 13  . WISDOM TOOTH EXTRACTION     as a teen     Home Meds: Current Meds  Medication Sig  . acetaminophen (TYLENOL) 650 MG CR tablet Take 650 mg by mouth daily as needed for pain.  Marland Kitchen anastrozole (ARIMIDEX) 1 MG tablet Take 1 mg by mouth daily.  . calcium carbonate (TUMS - DOSED IN MG ELEMENTAL CALCIUM) 500 MG chewable tablet Chew 1 tablet by mouth daily as needed for indigestion or heartburn.  . Cholecalciferol (VITAMIN D-3) 1000 UNITS CAPS Take 1,000 Units by mouth daily.   Marland Kitchen diltiazem (CARDIZEM SR) 60 MG 12 hr capsule Take 60 mg by mouth 2 (two) times daily.  . famotidine (PEPCID) 20 MG tablet Take by mouth 2 (two) times daily.   . fluticasone (FLONASE) 50 MCG/ACT nasal spray Place 1 spray into both nostrils daily as needed for allergies or rhinitis.  . Homeopathic Products (ARNICARE ARNICA) CREA Apply 1 application topically daily as needed (bruising).  Marland Kitchen levothyroxine (SYNTHROID, LEVOTHROID) 100 MCG tablet TAKE 1 TABLET BY MOUTH EVERY DAY (Patient taking differently: TAKE 1 TABLET BY MOUTH EVERY DAY   first thing in the a.m.)  . loratadine (CLARITIN) 10  MG tablet Take 10 mg by mouth daily as needed for allergies.  . Multiple Vitamin (MULTI-VITAMIN) tablet Take 1 tablet by mouth as needed.  . Probiotic Product (ALIGN PO) Take 1 tablet by mouth daily.  . rivaroxaban (XARELTO) 20 MG TABS tablet Take 20 mg by mouth daily with supper.  . sodium chloride (OCEAN) 0.65 % SOLN nasal spray Place 1 spray into both nostrils as needed for congestion.    Allergies:  Allergies  Allergen Reactions  . Bee Venom Anaphylaxis  . Coconut Fatty Acids     Irritation of mouth,. Minimal swelling in tongue & throat  . Lipitor [Atorvastatin]     Cramps in hands   . Omeprazole Nausea And Vomiting    Can take for only 2 days. 3rd day will have severe nausea and vomiting  . Pantoprazole      Can take for only 2 days. 3rd day will have severe nausea and vomiting  . Sulfamethoxazole-Trimethoprim Hives and Itching  . Adhesive [Tape] Rash    Social History   Socioeconomic History  . Marital status: Married    Spouse name: james x 40 + yrs  . Number of children: 1  . Years of education: Not on file  . Highest education level: Not on file  Occupational History  . Not on file  Tobacco Use  . Smoking status: Never Smoker  . Smokeless tobacco: Never Used  Substance and Sexual Activity  . Alcohol use: Yes    Comment: social use  . Drug use: No  . Sexual activity: Not on file  Other Topics Concern  . Not on file  Social History Narrative  . Not on file   Social Determinants of Health   Financial Resource Strain:   . Difficulty of Paying Living Expenses:   Food Insecurity:   . Worried About Charity fundraiser in the Last Year:   . Arboriculturist in the Last Year:   Transportation Needs:   . Film/video editor (Medical):   Marland Kitchen Lack of Transportation (Non-Medical):   Physical Activity:   . Days of Exercise per Week:   . Minutes of Exercise per Session:   Stress:   . Feeling of Stress :   Social Connections:   . Frequency of Communication with Friends and Family:   . Frequency of Social Gatherings with Friends and Family:   . Attends Religious Services:   . Active Member of Clubs or Organizations:   . Attends Archivist Meetings:   Marland Kitchen Marital Status:   Intimate Partner Violence:   . Fear of Current or Ex-Partner:   . Emotionally Abused:   Marland Kitchen Physically Abused:   . Sexually Abused:      Family History  Problem Relation Age of Onset  . Colon polyps Father 13  . Hypertension Father   . Alzheimer's disease Father        Deceased-95  . Breast cancer Mother 65       diagnosed second time at 27; Deceased-61  . Healthy Daughter   . Alzheimer's disease Paternal Aunt   . Throat cancer Paternal Uncle   . Lung cancer Paternal Uncle   . Prostate  cancer Paternal Uncle   . Lung cancer Maternal Uncle   . Stroke Maternal Grandmother   . Bladder Cancer Maternal Grandfather   . Heart attack Paternal Grandmother   . Colon cancer Neg Hx      ROS:  Please see the history of present illness.  All other systems reviewed and negative.    Physical Exam: Blood pressure 132/74, pulse 73, height 5\' 4"  (1.626 m), weight 176 lb (79.8 kg), SpO2 97 %. General: Well developed, well nourished female in no acute distress. Head: Normocephalic, atraumatic, sclera non-icteric, no xanthomas, nares are without discharge. EENT: normal  Lymph Nodes:  none Neck: Negative for carotid bruits. JVD not elevated. Back:without scoliosis kyphosis Lungs: Clear bilaterally to auscultation without wheezes, rales, or rhonchi. Breathing is unlabored. Heart: RRR with S1 S2. No  murmur . No rubs, or gallops appreciated. Abdomen: Soft, non-tender, non-distended with normoactive bowel sounds. No hepatomegaly. No rebound/guarding. No obvious abdominal masses. Msk:  Strength and tone appear normal for age. Extremities: No clubbing or cyanosis. No edema.  Distal pedal pulses are 2+ and equal bilaterally. Skin: Warm and Dry Neuro: Alert and oriented X 3. CN III-XII intact Grossly normal sensory and motor function . Psych:  Responds to questions appropriately with a normal affect.      Labs: Cardiac Enzymes No results for input(s): CKTOTAL, CKMB, TROPONINI in the last 72 hours. CBC Lab Results  Component Value Date   WBC 5.7 10/05/2017   HGB 12.7 10/05/2017   HCT 39.1 10/05/2017   MCV 96.1 10/05/2017   PLT 221 10/05/2017   PROTIME: No results for input(s): LABPROT, INR in the last 72 hours. Chemistry No results for input(s): NA, K, CL, CO2, BUN, CREATININE, CALCIUM, PROT, BILITOT, ALKPHOS, ALT, AST, GLUCOSE in the last 168 hours.  Invalid input(s): LABALBU Lipids Lab Results  Component Value Date   CHOL 169 06/09/2015   HDL 49.70 06/09/2015   LDLCALC 107  (H) 06/09/2015   TRIG 62.0 06/09/2015   BNP No results found for: PROBNP Thyroid Function Tests: No results for input(s): TSH, T4TOTAL, T3FREE, THYROIDAB in the last 72 hours.  Invalid input(s): FREET3 Miscellaneous No results found for: DDIMER  Radiology/Studies:  No results found.  EKG: sinus @ 73 14/0.7/36 Unusual P wave morphology suggestive of an intra-atrial conduction defect ECG 4/30 demonstrated atrial fibrillation at 158  Assessment and Plan:  Atrial fibrillation-paroxysmal  Breast cancer-treated  Patient has paroxysmal atrial fibrillation.  Not clear of the burden.  Hence, would be inclined to put her diltiazem on hold.  I recommended that she use some kind of wearable technology, Fitbit or apple watch to try to help [place] clarify her atrial fibrillation burden.  She will use her diltiazem as needed  Atrial fibrillation burden within inform other recommendations regarding therapies including medical therapy and/or ablative therapy.  Given the paucity of symptoms, wearable technology will also be important to look for persistent atrial fibrillation.  We discussed her thromboembolic risk.  She has a CHA2DS2-VASc score of 1 (2-age) and as such is an intermediate risk for thromboembolism.  My reading of the literature particularly in trial from [place] suggests that the most important one point for thromboembolic risk is age; hence, reviewing bleeding risks I have recommended that she consider anticoagulation.  She is agreeable.  In this regard, I have also recommended that she use apixaban as opposed to rivaroxaban based on the data sets showing improved safety and greater efficacy.  We will undertake an echocardiogram to look for other contributors i.e. hypertrophic cardiomyopathy, left atrial enlargement, left ventricular dysfunction.  She does not have symptoms to suggest sleep apnea.  But encourage weight loss.       Virl Axe

## 2020-01-07 NOTE — Patient Instructions (Addendum)
Medication Instructions:  Stop your diltiazem  Begin taking Eliquis 5mg  by mouth twice daily ( Eliquis savings card given)  *If you need a refill on your cardiac medications before your next appointment, please call your pharmacy*   Lab Work: None ordered.  If you have labs (blood work) drawn today and your tests are completely normal, you will receive your results only by: Marland Kitchen MyChart Message (if you have MyChart) OR . A paper copy in the mail If you have any lab test that is abnormal or we need to change your treatment, we will call you to review the results.   Testing/Procedures: Your physician has requested that you have an echocardiogram. Echocardiography is a painless test that uses sound waves to create images of your heart. It provides your doctor with information about the size and shape of your heart and how well your heart's chambers and valves are working. This procedure takes approximately one hour. There are no restrictions for this procedure.   Follow-Up: At [facility], you and your health needs are our priority.  As part of our continuing mission to provide you with exceptional heart care, we have created designated Provider Care Teams.  These Care Teams include your primary Cardiologist (physician) and Advanced Practice Providers (APPs -  Physician Assistants and Nurse Practitioners) who all work together to provide you with the care you need, when you need it.  We recommend signing up for the patient portal called "MyChart".  Sign up information is provided on this After Visit Summary.  MyChart is used to connect with patients for Virtual Visits (Telemedicine).  Patients are able to view lab/test results, encounter notes, upcoming appointments, etc.  Non-urgent messages can be sent to your provider as well.   To learn more about what you can do with MyChart, go to [email or web address].    Your next appointment:   02/07/2020 at 145pm  The format for your next  appointment:   Telehealth Provider:   Dr Caryl Comes

## 2020-01-08 DIAGNOSIS — Z7901 Long term (current) use of anticoagulants: Secondary | ICD-10-CM | POA: Diagnosis not present

## 2020-01-08 DIAGNOSIS — Z923 Personal history of irradiation: Secondary | ICD-10-CM | POA: Diagnosis not present

## 2020-01-08 DIAGNOSIS — K449 Diaphragmatic hernia without obstruction or gangrene: Secondary | ICD-10-CM | POA: Diagnosis not present

## 2020-01-08 DIAGNOSIS — Z170 Estrogen receptor positive status [ER+]: Secondary | ICD-10-CM | POA: Diagnosis not present

## 2020-01-08 DIAGNOSIS — C50511 Malignant neoplasm of lower-outer quadrant of right female breast: Secondary | ICD-10-CM | POA: Diagnosis not present

## 2020-01-08 DIAGNOSIS — Z9889 Other specified postprocedural states: Secondary | ICD-10-CM | POA: Diagnosis not present

## 2020-01-08 DIAGNOSIS — Z79899 Other long term (current) drug therapy: Secondary | ICD-10-CM | POA: Diagnosis not present

## 2020-01-08 DIAGNOSIS — Z79811 Long term (current) use of aromatase inhibitors: Secondary | ICD-10-CM | POA: Diagnosis not present

## 2020-01-09 ENCOUNTER — Telehealth: Payer: Self-pay | Admitting: Internal Medicine

## 2020-01-09 NOTE — Telephone Encounter (Signed)
Called patient and gave her the [place] number for eliquis assistance program.

## 2020-01-09 NOTE — Telephone Encounter (Signed)
Patient stated she called [place] and does not qualify for patient assistance. Patient stated she cannot afford eliquis or xarelto. Patient stated she is open to taking coumadin. Will forward to Dr. Caryl Comes and his nurse for advisement. Patient has eliquis samples for now to last her about a week.

## 2020-01-09 NOTE — Telephone Encounter (Signed)
Patient returning call.

## 2020-01-09 NOTE — Telephone Encounter (Signed)
New message:     Patient calling concering that the Elquis will run a around 1700.00 and 500 a month. Patient would like to see if she can get some type of assistant. Please call patient.

## 2020-01-09 NOTE — Telephone Encounter (Signed)
**Note De-Identified Victoria Pacheco Obfuscation** Per the pts pharmacy ([facility]) the pts insurance plan is paying their part for Eliquis and that the pts cost is $506/90 day supply at this time as she has a deductible to meet.  I have left a detailed message on the pts VM asking her to call me back to discuss alternatives like Warfarin as it is the only generic anticoagulant on the market at this time but will require INR visits. I also encouraged her to call [place] Pt Asst as advised earlier to see if they can help her.

## 2020-01-10 NOTE — Telephone Encounter (Signed)
**Note De-Identified Robet Crutchfield Obfuscation** The pt states that for now she wants to try Eliquis but is concerned about her deductible cost and her ability to pay a high yearly deductible each year. She has a lot of questions concerning Warfain/INR cost and pros and cons of taking Eliquis vs. Warfarin. I attempted to answer her questions but could not answer all.  I will forward this phone note to our pharmacy team to contact the pt to discuss.

## 2020-01-10 NOTE — Telephone Encounter (Signed)
Called and spoke with patient about Eliquis vs warfarin. Answered her questions. She did have a few more but they were on a paper at home and she was not home. Gave her our number and she will call back tomorrow AM

## 2020-01-31 ENCOUNTER — Other Ambulatory Visit: Payer: Self-pay

## 2020-01-31 ENCOUNTER — Ambulatory Visit (HOSPITAL_COMMUNITY): Payer: Medicare Other | Attending: Cardiology

## 2020-01-31 DIAGNOSIS — I4891 Unspecified atrial fibrillation: Secondary | ICD-10-CM | POA: Insufficient documentation

## 2020-02-07 ENCOUNTER — Telehealth (INDEPENDENT_AMBULATORY_CARE_PROVIDER_SITE_OTHER): Payer: Medicare Other | Admitting: Internal Medicine

## 2020-02-07 ENCOUNTER — Other Ambulatory Visit: Payer: Self-pay

## 2020-02-07 VITALS — BP 125/85 | HR 70 | Wt 175.0 lb

## 2020-02-07 DIAGNOSIS — I480 Paroxysmal atrial fibrillation: Secondary | ICD-10-CM

## 2020-02-07 NOTE — Progress Notes (Signed)
Electrophysiology TeleHealth Note   Due to national recommendations of social distancing due to COVID 19, an audio/video telehealth visit is felt to be most appropriate for this patient at this time.  See MyChart message from today for the patient's consent to telehealth for [facility].   Date:  02/07/2020   ID:  Victoria Pacheco, DOB 01-Jan-1951, MRN [id number]  Location: patient's home  Provider location: [address] [place]  Evaluation Performed: Follow-up visit  PCP:  Reynold Bowen, MD  Cardiologist:     Electrophysiologist:  SK   Chief Complaint:  Afib   History of Present Illness:    Victoria Pacheco is a 69 y.o. female who presents via audio/video conferencing for a telehealth visit today.  Since last being seen in our clinic for atrial fibrillation which was paroxysmal and had reverted to sinus when last seen the patient reports 4 interval afib episodes, mostly in the evening.  Aware by apple watch, also palps but no LH, SOB  Duration 8 -12 hrs with significant residual fatigue  No alcohol w dinner    DATE TEST EF   5/21 Echo   65-70 % LA  Normal            Thromboembolic risk factors ( age -77, Gender-1) for a CHADSVASc Score of 2 No hx of bleeding   The patient denies symptoms of fevers, chills, cough, or new SOB worrisome for COVID 19.   Past Medical History:  Diagnosis Date  . Allergic rhinitis   . Asthma    related to allergy to cockateil (bird)   . Cancer [facility])    Breast- stage one , stereotactic core biopsy - 08/2017 & again in 09/2017  . Diffuse cystic mastopathy   . Family history of breast cancer   . GERD (gastroesophageal reflux disease)   . Headache    hx of cluster headache , better since post menopausal  . History of kidney stones    passed spontaneously - fr. a dietary supplement   . Hx of colonic polyps   . Hypercholesteremia   . Hypothyroidism 1989   thyroid scan done with radio iodine   . Left knee injury   . Multiple  lipomas   . Osteopenia   . Pneumonia    not hosp. - treated with antibiotics ( several courses)   . Unspecified menopausal and postmenopausal disorder   . Vitamin D deficiency     Past Surgical History:  Procedure Laterality Date  . benign right breast biopsy  2002  . BREAST LUMPECTOMY WITH RADIOACTIVE SEED AND SENTINEL LYMPH NODE BIOPSY Right 10/10/2017   Procedure: BREAST LUMPECTOMY WITH RADIOACTIVE SEED AND SENTINEL LYMPH NODE BIOPSY;  Surgeon: Rolm Bookbinder, MD;  Location: [place];  Service: General;  Laterality: Right;  . TONSILLECTOMY AND ADENOIDECTOMY     at age 36  . WISDOM TOOTH EXTRACTION     as a teen    Current Outpatient Medications  Medication Sig Dispense Refill  . acetaminophen (TYLENOL) 650 MG CR tablet Take 650 mg by mouth daily as needed for pain.    Marland Kitchen anastrozole (ARIMIDEX) 1 MG tablet Take 1 mg by mouth daily.    Marland Kitchen apixaban (ELIQUIS) 5 MG TABS tablet Take 1 tablet (5 mg total) by mouth 2 (two) times daily. 180 tablet 3  . calcium carbonate (TUMS - DOSED IN MG ELEMENTAL CALCIUM) 500 MG chewable tablet Chew 1 tablet by mouth daily as needed for indigestion or heartburn.    Marland Kitchen  Cholecalciferol (VITAMIN D-3) 1000 UNITS CAPS Take 1,000 Units by mouth daily.     . famotidine (PEPCID) 20 MG tablet Take by mouth 2 (two) times daily.     . fluticasone (FLONASE) 50 MCG/ACT nasal spray Place 1 spray into both nostrils daily as needed for allergies or rhinitis.    . Homeopathic Products (ARNICARE ARNICA) CREA Apply 1 application topically daily as needed (bruising).    Marland Kitchen levothyroxine (SYNTHROID, LEVOTHROID) 100 MCG tablet TAKE 1 TABLET BY MOUTH EVERY DAY (Patient taking differently: TAKE 1 TABLET BY MOUTH EVERY DAY   first thing in the a.m.) 30 tablet 0  . loratadine (CLARITIN) 10 MG tablet Take 10 mg by mouth daily as needed for allergies.    . Multiple Vitamin (MULTI-VITAMIN) tablet Take 1 tablet by mouth as needed.    . Probiotic Product (ALIGN PO) Take 1 tablet by mouth  daily.    . sodium chloride (OCEAN) 0.65 % SOLN nasal spray Place 1 spray into both nostrils as needed for congestion.     No current facility-administered medications for this visit.    Allergies:   Bee venom, Coconut fatty acids, Lipitor [atorvastatin], Omeprazole, Pantoprazole, Sulfamethoxazole-trimethoprim, and Adhesive [tape]   Social History:  The patient  reports that she has never smoked. She has never used smokeless tobacco. She reports current alcohol use. She reports that she does not use drugs.   Family History:  The patient's   family history includes Alzheimer's disease in her father and paternal aunt; Bladder Cancer in her maternal grandfather; Breast cancer (age of onset: 36) in her mother; Colon polyps (age of onset: 4) in her father; Healthy in her daughter; Heart attack in her paternal grandmother; Hypertension in her father; Lung cancer in her maternal uncle and paternal uncle; Prostate cancer in her paternal uncle; Stroke in her maternal grandmother; Throat cancer in her paternal uncle.   ROS:  Please see the history of present illness.   All other systems are personally reviewed and negative.    Exam:    Vital Signs:  BP 125/85   Pulse 70   Wt 175 lb (79.4 kg)   BMI 30.04 kg/m         Labs/Other Tests and Data Reviewed:    Recent Labs: No results found for requested labs within last 8760 hours.   Wt Readings from Last 3 Encounters:  02/07/20 175 lb (79.4 kg)  01/07/20 176 lb (79.8 kg)  07/24/19 178 lb 6 oz (80.9 kg)     Other studies personally reviewed:      Apple watch HR >> 170's  ASSESSMENT & PLAN:    Atrial fibrillation RVR paroxysmal  Intraatrial lipomatous septum   Breast Ca   Recurrent episodes relatively frequently  So have broached  The topic of ablation and Anti-arrhythmic drugs although given her young age, frequency and residual symptoms I would incline towards the former and the latter only as a bridge.  Cant find anything  specific about lipomatous septum and afib   wewill regroup in a few weeks ago   We spent more than 50% of our >25 min visit in face to face counseling regarding the above    COVID 19 screen The patient denies symptoms of COVID 19 at this time.  The importance of social distancing was discussed today.  Follow-up: 3-4 weeks telehealth visit      Current medicines are reviewed at length with the patient today.   The patient does not have concerns regarding  her medicines.  The following changes were made today:  none  Labs/ tests ordered today include:   No orders of the defined types were placed in this encounter.     Patient Risk:  after full review of this patients clinical status, I feel that they are at moderate  risk at this time.  Today, I have spent 18  minutes with the patient with telehealth technology discussing the above.  Signed, Virl Axe, MD  02/07/2020 2:02 PM     [place] [address] [place] [address] [phone] (office) [phone] (fax)

## 2020-02-07 NOTE — Patient Instructions (Signed)
Medication Instructions:  Your physician recommends that you continue on your current medications as directed. Please refer to the Current Medication list given to you today.  *If you need a refill on your cardiac medications before your next appointment, please call your pharmacy*   Lab Work: None ordered.  If you have labs (blood work) drawn today and your tests are completely normal, you will receive your results only by: Marland Kitchen MyChart Message (if you have MyChart) OR . A paper copy in the mail If you have any lab test that is abnormal or we need to change your treatment, we will call you to review the results.   Testing/Procedures: None ordered.    Follow-Up: At [facility], you and your health needs are our priority.  As part of our continuing mission to provide you with exceptional heart care, we have created designated Provider Care Teams.  These Care Teams include your primary Cardiologist (physician) and Advanced Practice Providers (APPs -  Physician Assistants and Nurse Practitioners) who all work together to provide you with the care you need, when you need it.  We recommend signing up for the patient portal called "MyChart".  Sign up information is provided on this After Visit Summary.  MyChart is used to connect with patients for Virtual Visits (Telemedicine).  Patients are able to view lab/test results, encounter notes, upcoming appointments, etc.  Non-urgent messages can be sent to your provider as well.   To learn more about what you can do with MyChart, go to [email or web address].    Your next appointment:   Thursday 03/20/2020 at 315  The format for your next appointment:   Virtual Visit   Provider:   Virl Axe, MD

## 2020-03-20 ENCOUNTER — Telehealth: Payer: Medicare Other | Admitting: Internal Medicine

## 2020-03-27 ENCOUNTER — Telehealth (INDEPENDENT_AMBULATORY_CARE_PROVIDER_SITE_OTHER): Payer: Medicare Other | Admitting: Internal Medicine

## 2020-03-27 ENCOUNTER — Telehealth: Payer: Self-pay

## 2020-03-27 ENCOUNTER — Other Ambulatory Visit: Payer: Self-pay

## 2020-03-27 VITALS — BP 118/82 | HR 76 | Ht 64.0 in | Wt 170.0 lb

## 2020-03-27 DIAGNOSIS — I480 Paroxysmal atrial fibrillation: Secondary | ICD-10-CM

## 2020-03-27 MED ORDER — VERAPAMIL HCL 40 MG PO TABS: ORAL_TABLET | ORAL | 0 refills | Status: DC

## 2020-03-27 NOTE — Telephone Encounter (Signed)
  Patient Consent for Virtual Visit         Victoria Pacheco has provided verbal consent on 03/27/2020 for a virtual visit (video or telephone).   CONSENT FOR VIRTUAL VISIT FOR:  Victoria Pacheco  By participating in this virtual visit I agree to the following:  I hereby voluntarily request, consent and authorize [facility] and its employed or contracted physicians, physician assistants, nurse practitioners or other licensed health care professionals (the Practitioner), to provide me with telemedicine health care services (the "Services") as deemed necessary by the treating Practitioner. I acknowledge and consent to receive the Services by the Practitioner via telemedicine. I understand that the telemedicine visit will involve communicating with the Practitioner through live audiovisual communication technology and the disclosure of certain medical information by electronic transmission. I acknowledge that I have been given the opportunity to request an in-person assessment or other available alternative prior to the telemedicine visit and am voluntarily participating in the telemedicine visit.  I understand that I have the right to withhold or withdraw my consent to the use of telemedicine in the course of my care at any time, without affecting my right to future care or treatment, and that the Practitioner or I may terminate the telemedicine visit at any time. I understand that I have the right to inspect all information obtained and/or recorded in the course of the telemedicine visit and may receive copies of available information for a reasonable fee.  I understand that some of the potential risks of receiving the Services via telemedicine include:  Marland Kitchen Delay or interruption in medical evaluation due to technological equipment failure or disruption; . Information transmitted may not be sufficient (e.g. poor resolution of images) to allow for appropriate medical decision making by the Practitioner; and/or    . In rare instances, security protocols could fail, causing a breach of personal health information.  Furthermore, I acknowledge that it is my responsibility to provide information about my medical history, conditions and care that is complete and accurate to the best of my ability. I acknowledge that Practitioner's advice, recommendations, and/or decision may be based on factors not within their control, such as incomplete or inaccurate data provided by me or distortions of diagnostic images or specimens that may result from electronic transmissions. I understand that the practice of medicine is not an exact science and that Practitioner makes no warranties or guarantees regarding treatment outcomes. I acknowledge that a copy of this consent can be made available to me via my patient portal ([facility]), or I can request a printed copy by calling the office of [facility].    I understand that my insurance will be billed for this visit.   I have read or had this consent read to me. . I understand the contents of this consent, which adequately explains the benefits and risks of the Services being provided via telemedicine.  . I have been provided ample opportunity to ask questions regarding this consent and the Services and have had my questions answered to my satisfaction. . I give my informed consent for the services to be provided through the use of telemedicine in my medical care

## 2020-03-27 NOTE — Addendum Note (Signed)
Addended by: Thora Lance on: 03/27/2020 08:02 PM   Modules accepted: Orders

## 2020-03-27 NOTE — Patient Instructions (Signed)
Medication Instructions:  Your physician has recommended you make the following change in your medication:  *If you need a refill on your cardiac medications before your next appointment, please call your pharmacy  ** Verapamil 40mg  - 1 tablet by mouth every 1 hour x 3 as needed for fast heart rate.  A [facility] on [facility].   Lab Work: None ordered.  If you have labs (blood work) drawn today and your tests are completely normal, you will receive your results only by: Marland Kitchen MyChart Message (if you have MyChart) OR . A paper copy in the mail If you have any lab test that is abnormal or we need to change your treatment, we will call you to review the results.   Testing/Procedures: None ordered.    Follow-Up: At [facility], you and your health needs are our priority.  As part of our continuing mission to provide you with exceptional heart care, we have created designated Provider Care Teams.  These Care Teams include your primary Cardiologist (physician) and Advanced Practice Providers (APPs -  Physician Assistants and Nurse Practitioners) who all work together to provide you with the care you need, when you need it.  We recommend signing up for the patient portal called "MyChart".  Sign up information is provided on this After Visit Summary.  MyChart is used to connect with patients for Virtual Visits (Telemedicine).  Patients are able to view lab/test results, encounter notes, upcoming appointments, etc.  Non-urgent messages can be sent to your provider as well.   To learn more about what you can do with MyChart, go to [email or web address].    Your next appointment:   4 month(s)  The format for your next appointment:   In Person  Provider:   Virl Axe, MD

## 2020-03-27 NOTE — Progress Notes (Signed)
Electrophysiology TeleHealth Note   Due to national recommendations of social distancing due to COVID 19, an audio/video telehealth visit is felt to be most appropriate for this patient at this time.  See MyChart message from today for the patient's consent to telehealth for [facility].   Date:  03/27/2020   ID:  Victoria Pacheco, DOB 12-Feb-1951, MRN [id number]  Location: patient's home  Provider location: [address], [place]  Evaluation Performed: Follow-up visit  PCP:  Reynold Bowen, MD  Cardiologist:     Electrophysiologist:  SK   Chief Complaint:  Afib   History of Present Illness:    Victoria Pacheco is a 69 y.o. female who presents via audio/video conferencing for a telehealth visit today.  Since last being seen in our clinic for paroxysmal atrial fibrillation  7 afibs since MAY duration still about 8-12, mostly beginning in the evening; No alcohol w dinner   Eating large meals in the middle of the day, recurrent afib still in the evening.    HR >> 182   With afib  Weakness and dyspnea with spells   No bleeding        DATE TEST EF   5/21 Echo   65-70 % LA  Normal            Thromboembolic risk factors ( age -37, Gender-1) for a CHADSVASc Score of 2 No bleeding  The patient denies symptoms of fevers, chills, cough, or new SOB worrisome for COVID 19.   Past Medical History:  Diagnosis Date  . Allergic rhinitis   . Asthma    related to allergy to cockateil (bird)   . Cancer [facility])    Breast- stage one , stereotactic core biopsy - 08/2017 & again in 09/2017  . Diffuse cystic mastopathy   . Family history of breast cancer   . GERD (gastroesophageal reflux disease)   . Headache    hx of cluster headache , better since post menopausal  . History of kidney stones    passed spontaneously - fr. a dietary supplement   . Hx of colonic polyps   . Hypercholesteremia   . Hypothyroidism 1989   thyroid scan done with radio iodine   . Left knee injury     . Multiple lipomas   . Osteopenia   . Pneumonia    not hosp. - treated with antibiotics ( several courses)   . Unspecified menopausal and postmenopausal disorder   . Vitamin D deficiency     Past Surgical History:  Procedure Laterality Date  . benign right breast biopsy  2002  . BREAST LUMPECTOMY WITH RADIOACTIVE SEED AND SENTINEL LYMPH NODE BIOPSY Right 10/10/2017   Procedure: BREAST LUMPECTOMY WITH RADIOACTIVE SEED AND SENTINEL LYMPH NODE BIOPSY;  Surgeon: Rolm Bookbinder, MD;  Location: [place];  Service: General;  Laterality: Right;  . TONSILLECTOMY AND ADENOIDECTOMY     at age 37  . WISDOM TOOTH EXTRACTION     as a teen    Current Outpatient Medications  Medication Sig Dispense Refill  . acetaminophen (TYLENOL) 650 MG CR tablet Take 650 mg by mouth daily as needed for pain.    Marland Kitchen anastrozole (ARIMIDEX) 1 MG tablet Take 1 mg by mouth daily.    Marland Kitchen apixaban (ELIQUIS) 5 MG TABS tablet Take 1 tablet (5 mg total) by mouth 2 (two) times daily. 180 tablet 3  . calcium carbonate (TUMS - DOSED IN MG ELEMENTAL CALCIUM) 500 MG chewable tablet Chew 1  tablet by mouth daily as needed for indigestion or heartburn.    . Cholecalciferol (VITAMIN D-3) 1000 UNITS CAPS Take 1,000 Units by mouth daily.     . famotidine (PEPCID) 20 MG tablet Take 10 mg by mouth 2 (two) times daily.     . fluticasone (FLONASE) 50 MCG/ACT nasal spray Place 1 spray into both nostrils daily as needed for allergies or rhinitis.    Marland Kitchen levothyroxine (SYNTHROID, LEVOTHROID) 100 MCG tablet TAKE 1 TABLET BY MOUTH EVERY DAY (Patient taking differently: TAKE 1 TABLET BY MOUTH EVERY DAY   first thing in the a.m.) 30 tablet 0  . loratadine (CLARITIN) 10 MG tablet Take 10 mg by mouth daily as needed for allergies.    . Multiple Vitamin (MULTI-VITAMIN) tablet Take 1 tablet by mouth as needed.    . Probiotic Product (ALIGN PO) Take 1 tablet by mouth daily.    . sodium chloride (OCEAN) 0.65 % SOLN nasal spray Place 1 spray into both  nostrils as needed for congestion.    . Homeopathic Products (ARNICARE ARNICA) CREA Apply 1 application topically daily as needed (bruising).     No current facility-administered medications for this visit.    Allergies:   Bee venom, Coconut fatty acids, Lipitor [atorvastatin], Omeprazole, Pantoprazole, Sulfamethoxazole-trimethoprim, and Adhesive [tape]   Social History:  The patient  reports that she has never smoked. She has never used smokeless tobacco. She reports current alcohol use. She reports that she does not use drugs.   Family History:  The patient's   family history includes Alzheimer's disease in her father and paternal aunt; Bladder Cancer in her maternal grandfather; Breast cancer (age of onset: 37) in her mother; Colon polyps (age of onset: 80) in her father; Healthy in her daughter; Heart attack in her paternal grandmother; Hypertension in her father; Lung cancer in her maternal uncle and paternal uncle; Prostate cancer in her paternal uncle; Stroke in her maternal grandmother; Throat cancer in her paternal uncle.   ROS:  Please see the history of present illness.   All other systems are personally reviewed and negative.    Exam:    Vital Signs:  BP 118/82   Pulse 76   Ht 5\' 4"  (1.626 m)   Wt 170 lb (77.1 kg)   BMI 29.18 kg/m         Labs/Other Tests and Data Reviewed:    Recent Labs: No results found for requested labs within last 8760 hours.   Wt Readings from Last 3 Encounters:  03/27/20 170 lb (77.1 kg)  02/07/20 175 lb (79.4 kg)  01/07/20 176 lb (79.8 kg)     Other studies personally reviewed:    ASSESSMENT & PLAN:    Atrial fibrillation RVR paroxysmal  Intraatrial lipomatous septum   Breast Ca   Discussion re Anti-arrhythmic drugs and ablation For rhythm control   At this point she is inclined to continue current strategy and avoid further   HR are fast  >> 180s   >> prn rate control   Greatest fear is stroke-- so will continue Apixoban     Discussed vaccine and the role of booster        COVID 19 screen The patient denies symptoms of COVID 19 at this time.  The importance of social distancing was discussed today.  Follow-up: 4 m    Current medicines are reviewed at length with the patient today.   The patient has no* concerns regarding her medicines.  The following changes were  made today: Verapamil 40 mg take one Q 1h x 3 for tachycardia  #30  Labs/ tests ordered today include:   No orders of the defined types were placed in this encounter.     Patient Risk:  after full review of this patients clinical status, I feel that they are at moderate  risk at this time.  Today, I have spent 20  minutes with the patient with telehealth technology discussing the above.  Signed, Virl Axe, MD  03/27/2020 4:37 PM     [place] [address] [phone] (office) [phone] (fax)

## 2020-04-08 DIAGNOSIS — H25041 Posterior subcapsular polar age-related cataract, right eye: Secondary | ICD-10-CM | POA: Diagnosis not present

## 2020-04-08 DIAGNOSIS — H43811 Vitreous degeneration, right eye: Secondary | ICD-10-CM | POA: Diagnosis not present

## 2020-04-08 DIAGNOSIS — H25012 Cortical age-related cataract, left eye: Secondary | ICD-10-CM | POA: Diagnosis not present

## 2020-06-09 ENCOUNTER — Telehealth: Payer: Self-pay | Admitting: Internal Medicine

## 2020-06-09 NOTE — Telephone Encounter (Signed)
Patient would like to know if it is recommended that she get the covid booster shot.

## 2020-06-09 NOTE — Telephone Encounter (Signed)
Spoke with pt and advised [place] does recommend the Covid vaccines and booster.  Recommended pt contact her PCP as well for CDC guidelines for Covid booster.  Pt verbalizes understanding and agrees with current plan.

## 2020-06-12 DIAGNOSIS — Z23 Encounter for immunization: Secondary | ICD-10-CM | POA: Diagnosis not present

## 2020-07-01 DIAGNOSIS — E559 Vitamin D deficiency, unspecified: Secondary | ICD-10-CM | POA: Diagnosis not present

## 2020-07-01 DIAGNOSIS — E785 Hyperlipidemia, unspecified: Secondary | ICD-10-CM | POA: Diagnosis not present

## 2020-07-01 DIAGNOSIS — E039 Hypothyroidism, unspecified: Secondary | ICD-10-CM | POA: Diagnosis not present

## 2020-07-08 DIAGNOSIS — K219 Gastro-esophageal reflux disease without esophagitis: Secondary | ICD-10-CM | POA: Diagnosis not present

## 2020-07-08 DIAGNOSIS — E669 Obesity, unspecified: Secondary | ICD-10-CM | POA: Diagnosis not present

## 2020-07-08 DIAGNOSIS — Z23 Encounter for immunization: Secondary | ICD-10-CM | POA: Diagnosis not present

## 2020-07-08 DIAGNOSIS — Z0000 Encounter for general adult medical examination without abnormal findings: Secondary | ICD-10-CM | POA: Diagnosis not present

## 2020-07-08 DIAGNOSIS — N1831 Chronic kidney disease, stage 3a: Secondary | ICD-10-CM | POA: Diagnosis not present

## 2020-07-08 DIAGNOSIS — Z1331 Encounter for screening for depression: Secondary | ICD-10-CM | POA: Diagnosis not present

## 2020-07-08 DIAGNOSIS — K635 Polyp of colon: Secondary | ICD-10-CM | POA: Diagnosis not present

## 2020-07-08 DIAGNOSIS — R82998 Other abnormal findings in urine: Secondary | ICD-10-CM | POA: Diagnosis not present

## 2020-07-08 DIAGNOSIS — Z1339 Encounter for screening examination for other mental health and behavioral disorders: Secondary | ICD-10-CM | POA: Diagnosis not present

## 2020-07-08 DIAGNOSIS — J45909 Unspecified asthma, uncomplicated: Secondary | ICD-10-CM | POA: Diagnosis not present

## 2020-07-08 DIAGNOSIS — E785 Hyperlipidemia, unspecified: Secondary | ICD-10-CM | POA: Diagnosis not present

## 2020-07-08 DIAGNOSIS — E559 Vitamin D deficiency, unspecified: Secondary | ICD-10-CM | POA: Diagnosis not present

## 2020-07-08 DIAGNOSIS — M8580 Other specified disorders of bone density and structure, unspecified site: Secondary | ICD-10-CM | POA: Diagnosis not present

## 2020-07-08 DIAGNOSIS — I4891 Unspecified atrial fibrillation: Secondary | ICD-10-CM | POA: Diagnosis not present

## 2020-07-08 DIAGNOSIS — E039 Hypothyroidism, unspecified: Secondary | ICD-10-CM | POA: Diagnosis not present

## 2020-07-08 DIAGNOSIS — C50911 Malignant neoplasm of unspecified site of right female breast: Secondary | ICD-10-CM | POA: Diagnosis not present

## 2020-07-10 DIAGNOSIS — Z170 Estrogen receptor positive status [ER+]: Secondary | ICD-10-CM | POA: Diagnosis not present

## 2020-07-10 DIAGNOSIS — C50511 Malignant neoplasm of lower-outer quadrant of right female breast: Secondary | ICD-10-CM | POA: Diagnosis not present

## 2020-07-10 DIAGNOSIS — Z79811 Long term (current) use of aromatase inhibitors: Secondary | ICD-10-CM | POA: Diagnosis not present

## 2020-08-08 ENCOUNTER — Encounter: Payer: Self-pay | Admitting: Internal Medicine

## 2020-08-08 ENCOUNTER — Ambulatory Visit (INDEPENDENT_AMBULATORY_CARE_PROVIDER_SITE_OTHER): Payer: Medicare Other | Admitting: Internal Medicine

## 2020-08-08 ENCOUNTER — Other Ambulatory Visit: Payer: Self-pay

## 2020-08-08 VITALS — BP 122/78 | HR 64 | Ht 64.0 in | Wt 178.2 lb

## 2020-08-08 DIAGNOSIS — I480 Paroxysmal atrial fibrillation: Secondary | ICD-10-CM

## 2020-08-08 NOTE — Patient Instructions (Signed)

## 2020-08-08 NOTE — Progress Notes (Signed)
Patient Care Team: Reynold Bowen, MD as PCP - General (Endocrinology) Roque Cash., MD as Consulting Physician (Obstetrics and Gynecology)   HPI  Victoria Pacheco is a 69 y.o. female seen in followup for paroxysmal afib   She continues to have episodes about once a month.  It typically last 8-12 hours.  Associated with fatigue.  Has noted blood pressures are typically relatively low, 90-110.  Heart rates are fast based on her apple watch 150-170.  No clear trigger.  Had been started on Xarelto by Dr. Forde Dandy.  We changed it to Eliquis.  No bleeding issues  Weight has gone up recently.  She attributes this to lack of exercise that she has been reluctant to walk vigorously.  DATE TEST EF   5/21 Echo   65-70 % LA  Normal          Records and Results Reviewed   Past Medical History:  Diagnosis Date   Allergic rhinitis    Asthma    related to allergy to cockateil (bird)    Cancer [facility])    Breast- stage one , stereotactic core biopsy - 08/2017 & again in 09/2017   Diffuse cystic mastopathy    Family history of breast cancer    GERD (gastroesophageal reflux disease)    Headache    hx of cluster headache , better since post menopausal   History of kidney stones    passed spontaneously - fr. a dietary supplement    Hx of colonic polyps    Hypercholesteremia    Hypothyroidism 1989   thyroid scan done with radio iodine    Left knee injury    Multiple lipomas    Osteopenia    Pneumonia    not hosp. - treated with antibiotics ( several courses)    Unspecified menopausal and postmenopausal disorder    Vitamin D deficiency     Past Surgical History:  Procedure Laterality Date   benign right breast biopsy  2002   BREAST LUMPECTOMY WITH RADIOACTIVE SEED AND SENTINEL LYMPH NODE BIOPSY Right 10/10/2017   Procedure: BREAST LUMPECTOMY WITH RADIOACTIVE SEED AND SENTINEL LYMPH NODE BIOPSY;  Surgeon: Rolm Bookbinder, MD;  Location: [place];  Service:  General;  Laterality: Right;   TONSILLECTOMY AND ADENOIDECTOMY     at age 86   WISDOM TOOTH EXTRACTION     as a teen    Current Meds  Medication Sig   acetaminophen (TYLENOL) 650 MG CR tablet Take 650 mg by mouth daily as needed for pain.   anastrozole (ARIMIDEX) 1 MG tablet Take 1 mg by mouth daily.   apixaban (ELIQUIS) 5 MG TABS tablet Take 1 tablet (5 mg total) by mouth 2 (two) times daily.   calcium carbonate (TUMS - DOSED IN MG ELEMENTAL CALCIUM) 500 MG chewable tablet Chew 1 tablet by mouth daily as needed for indigestion or heartburn.   Cholecalciferol (VITAMIN D-3) 1000 UNITS CAPS Take 1,000 Units by mouth daily.    famotidine (PEPCID) 20 MG tablet Take 10 mg by mouth 2 (two) times daily.    fluticasone (FLONASE) 50 MCG/ACT nasal spray Place 1 spray into both nostrils daily as needed for allergies or rhinitis.   Homeopathic Products (ARNICARE ARNICA) CREA Apply 1 application topically daily as needed (bruising).   levothyroxine (SYNTHROID, LEVOTHROID) 100 MCG tablet TAKE 1 TABLET BY MOUTH EVERY DAY (Patient taking differently: TAKE 1 TABLET BY MOUTH EVERY DAY   first thing in the a.m.)   loratadine (CLARITIN)  10 MG tablet Take 10 mg by mouth daily as needed for allergies.   Multiple Vitamin (MULTI-VITAMIN) tablet Take 1 tablet by mouth as needed.   Probiotic Product (ALIGN PO) Take 1 tablet by mouth daily.   sodium chloride (OCEAN) 0.65 % SOLN nasal spray Place 1 spray into both nostrils as needed for congestion.   verapamil (CALAN) 40 MG tablet Take 1 tablet by mouth q1hr x 3 for tachycardia    Allergies  Allergen Reactions   Bee Venom Anaphylaxis   Coconut Fatty Acids     Irritation of mouth,. Minimal swelling in tongue & throat   Lipitor [Atorvastatin]     Cramps in hands    Omeprazole Nausea And Vomiting    Can take for only 2 days. 3rd day will have severe nausea and vomiting   Pantoprazole     Can take for only 2 days. 3rd day will have severe  nausea and vomiting   Sulfamethoxazole-Trimethoprim Hives and Itching   Adhesive [Tape] Rash      Review of Systems negative except from HPI and PMH  Physical Exam BP 122/78    Pulse 64    Ht 5\' 4"  (1.626 m)    Wt 178 lb 3.2 oz (80.8 kg)    SpO2 97%    BMI 30.59 kg/m  Well developed and well nourished in no acute distress HENT normal E scleral and icterus clear Neck Supple JVP flat; carotids brisk and full Clear to ausculation Regular rate and rhythm, no murmurs gallops or rub Soft with active bowel sounds No clubbing cyanosis  Edema Alert and oriented, grossly normal motor and sensory function Skin Warm and Dry  ECG sinus @ 14/06/36 NSTT   CrCl cannot be calculated (Patient's most recent lab result is older than the maximum 21 days allowed.).   Assessment and  Plan  Atrial fibrillation RVR paroxysmal  Intraatrial lipomatous septum   Breast Ca   Continues with infrequent episodes of atrial fibrillation, about once a month. Somewhat discombobulated with fatigue. Blood pressures relatively low have prompted concerns about using the verapamil. Heart rate however relatively rapid in the 160+ range and I wonder to what degree blood pressure is a consequence of the more rapid rates and have encouraged her to try using the verapamil.  We discussed again alternative strategies including antiarrhythmic therapy and catheter ablation. At this point she remains inclined towards continuing her current therapeutic plan  Also discussed anticoagulation. Her CHA2DS2-VASc score is 2 with 1 for gender. It is a borderline recommendation for anticoagulation in this cohort. Given her young age, her risk of bleeding is considerably lower. Her age is certainly the strongest single point for thromboembolic risk. At this juncture following these discussions looking at [facility], has elected to continue her Eliquis for now.    Current medicines are reviewed at length with the patient today .   The patient does not  have concerns regarding medicines.

## 2020-08-26 DIAGNOSIS — Z1212 Encounter for screening for malignant neoplasm of rectum: Secondary | ICD-10-CM | POA: Diagnosis not present

## 2020-09-02 DIAGNOSIS — D1722 Benign lipomatous neoplasm of skin and subcutaneous tissue of left arm: Secondary | ICD-10-CM | POA: Diagnosis not present

## 2020-09-02 DIAGNOSIS — B351 Tinea unguium: Secondary | ICD-10-CM | POA: Diagnosis not present

## 2020-09-02 DIAGNOSIS — L821 Other seborrheic keratosis: Secondary | ICD-10-CM | POA: Diagnosis not present

## 2020-09-02 DIAGNOSIS — D1721 Benign lipomatous neoplasm of skin and subcutaneous tissue of right arm: Secondary | ICD-10-CM | POA: Diagnosis not present

## 2020-09-02 DIAGNOSIS — D2372 Other benign neoplasm of skin of left lower limb, including hip: Secondary | ICD-10-CM | POA: Diagnosis not present

## 2020-09-02 DIAGNOSIS — L570 Actinic keratosis: Secondary | ICD-10-CM | POA: Diagnosis not present

## 2020-09-02 DIAGNOSIS — D1801 Hemangioma of skin and subcutaneous tissue: Secondary | ICD-10-CM | POA: Diagnosis not present

## 2020-10-02 DIAGNOSIS — Z01419 Encounter for gynecological examination (general) (routine) without abnormal findings: Secondary | ICD-10-CM | POA: Diagnosis not present

## 2020-10-02 DIAGNOSIS — N905 Atrophy of vulva: Secondary | ICD-10-CM | POA: Diagnosis not present

## 2020-10-02 DIAGNOSIS — N907 Vulvar cyst: Secondary | ICD-10-CM | POA: Diagnosis not present

## 2020-10-02 DIAGNOSIS — R140 Abdominal distension (gaseous): Secondary | ICD-10-CM | POA: Diagnosis not present

## 2020-10-02 DIAGNOSIS — Z1151 Encounter for screening for human papillomavirus (HPV): Secondary | ICD-10-CM | POA: Diagnosis not present

## 2020-10-13 DIAGNOSIS — Z9011 Acquired absence of right breast and nipple: Secondary | ICD-10-CM | POA: Diagnosis not present

## 2020-10-13 DIAGNOSIS — Z08 Encounter for follow-up examination after completed treatment for malignant neoplasm: Secondary | ICD-10-CM | POA: Diagnosis not present

## 2020-10-13 DIAGNOSIS — Z1231 Encounter for screening mammogram for malignant neoplasm of breast: Secondary | ICD-10-CM | POA: Diagnosis not present

## 2020-10-13 DIAGNOSIS — R922 Inconclusive mammogram: Secondary | ICD-10-CM | POA: Diagnosis not present

## 2020-10-13 DIAGNOSIS — Z853 Personal history of malignant neoplasm of breast: Secondary | ICD-10-CM | POA: Diagnosis not present

## 2021-01-05 DIAGNOSIS — E559 Vitamin D deficiency, unspecified: Secondary | ICD-10-CM | POA: Diagnosis not present

## 2021-01-05 DIAGNOSIS — K635 Polyp of colon: Secondary | ICD-10-CM | POA: Diagnosis not present

## 2021-01-05 DIAGNOSIS — N1831 Chronic kidney disease, stage 3a: Secondary | ICD-10-CM | POA: Diagnosis not present

## 2021-01-05 DIAGNOSIS — C50911 Malignant neoplasm of unspecified site of right female breast: Secondary | ICD-10-CM | POA: Diagnosis not present

## 2021-01-05 DIAGNOSIS — E669 Obesity, unspecified: Secondary | ICD-10-CM | POA: Diagnosis not present

## 2021-01-05 DIAGNOSIS — I4891 Unspecified atrial fibrillation: Secondary | ICD-10-CM | POA: Diagnosis not present

## 2021-01-05 DIAGNOSIS — K219 Gastro-esophageal reflux disease without esophagitis: Secondary | ICD-10-CM | POA: Diagnosis not present

## 2021-01-05 DIAGNOSIS — J45909 Unspecified asthma, uncomplicated: Secondary | ICD-10-CM | POA: Diagnosis not present

## 2021-01-05 DIAGNOSIS — E785 Hyperlipidemia, unspecified: Secondary | ICD-10-CM | POA: Diagnosis not present

## 2021-01-05 DIAGNOSIS — M8580 Other specified disorders of bone density and structure, unspecified site: Secondary | ICD-10-CM | POA: Diagnosis not present

## 2021-01-05 DIAGNOSIS — E039 Hypothyroidism, unspecified: Secondary | ICD-10-CM | POA: Diagnosis not present

## 2021-01-07 DIAGNOSIS — Z733 Stress, not elsewhere classified: Secondary | ICD-10-CM | POA: Diagnosis not present

## 2021-01-07 DIAGNOSIS — Z780 Asymptomatic menopausal state: Secondary | ICD-10-CM | POA: Diagnosis not present

## 2021-01-07 DIAGNOSIS — Z170 Estrogen receptor positive status [ER+]: Secondary | ICD-10-CM | POA: Diagnosis not present

## 2021-01-07 DIAGNOSIS — C50811 Malignant neoplasm of overlapping sites of right female breast: Secondary | ICD-10-CM | POA: Diagnosis not present

## 2021-01-07 DIAGNOSIS — Z79811 Long term (current) use of aromatase inhibitors: Secondary | ICD-10-CM | POA: Diagnosis not present

## 2021-01-07 DIAGNOSIS — F064 Anxiety disorder due to known physiological condition: Secondary | ICD-10-CM | POA: Diagnosis not present

## 2021-01-07 DIAGNOSIS — C50511 Malignant neoplasm of lower-outer quadrant of right female breast: Secondary | ICD-10-CM | POA: Diagnosis not present

## 2021-01-07 DIAGNOSIS — Z923 Personal history of irradiation: Secondary | ICD-10-CM | POA: Diagnosis not present

## 2021-01-07 DIAGNOSIS — Z9012 Acquired absence of left breast and nipple: Secondary | ICD-10-CM | POA: Diagnosis not present

## 2021-01-07 DIAGNOSIS — N6321 Unspecified lump in the left breast, upper outer quadrant: Secondary | ICD-10-CM | POA: Diagnosis not present

## 2021-01-08 ENCOUNTER — Telehealth: Payer: Self-pay | Admitting: Internal Medicine

## 2021-01-08 MED ORDER — ELIQUIS 5 MG PO TABS: 5.0000 mg | ORAL_TABLET | Freq: Two times a day (BID) | ORAL | 5 refills | Status: DC

## 2021-01-08 NOTE — Telephone Encounter (Signed)
Prescription refill request for Eliquis received.  Indication: afib  Last office visit: Caryl Comes, 08/08/2020 Scr: 1.12, 01/07/2021 Age: 70 yo  Weight: 80.8 kg   Pt is on the correct dose of Eliquis per dosing criteria, prescription refill sent for Eliquis 5mg  BID.

## 2021-01-08 NOTE — Telephone Encounter (Signed)
*  STAT* If patient is at the pharmacy, call can be transferred to refill team.   1. Which medications need to be refilled? (please list name of each medication and dose if known) apixaban (ELIQUIS) 5 MG TABS tablet  2. Which pharmacy/location (including street and city if local pharmacy) is medication to be sent to? [id number] - [place], [place] - [address] AT [address]OSS FROM [address]  3. Do they need a 30 day or 90 day supply?  30 day supply

## 2021-02-26 ENCOUNTER — Ambulatory Visit: Payer: Medicare Other | Admitting: Internal Medicine

## 2021-03-02 ENCOUNTER — Ambulatory Visit (INDEPENDENT_AMBULATORY_CARE_PROVIDER_SITE_OTHER): Payer: Medicare Other | Admitting: Internal Medicine

## 2021-03-02 ENCOUNTER — Other Ambulatory Visit: Payer: Self-pay

## 2021-03-02 ENCOUNTER — Encounter: Payer: Self-pay | Admitting: Internal Medicine

## 2021-03-02 VITALS — BP 128/80 | HR 69 | Ht 64.5 in | Wt 177.2 lb

## 2021-03-02 DIAGNOSIS — I480 Paroxysmal atrial fibrillation: Secondary | ICD-10-CM

## 2021-03-02 MED ORDER — FUROSEMIDE 20 MG PO TABS
20.0000 mg | ORAL_TABLET | ORAL | 0 refills | Status: DC | PRN
Start: 2021-03-02 — End: 2021-03-16

## 2021-03-02 MED ORDER — MAGNESIUM OXIDE 400 MG PO CAPS: 400.0000 mg | ORAL_CAPSULE | Freq: Every day | ORAL | 0 refills | Status: DC

## 2021-03-02 MED ORDER — FLECAINIDE ACETATE 50 MG PO TABS: 50.0000 mg | ORAL_TABLET | Freq: Two times a day (BID) | ORAL | 3 refills | Status: DC

## 2021-03-02 NOTE — Patient Instructions (Signed)
Medication Instructions:  Your physician has recommended you make the following change in your medication:   ** Take Furosemide 20mg  - 1 tablet by mouth x 3 days  ** Begin Magnesium Oxide 400mg  - 1 tablet by mouth daily  **  Begin Flecainide 50mg  - 1 tablet by mouth twice daily  *If you need a refill on your cardiac medications before your next appointment, please call your pharmacy*   Lab Work: None ordered.  If you have labs (blood work) drawn today and your tests are completely normal, you will receive your results only by: [place] (if you have MyChart) OR A paper copy in the mail If you have any lab test that is abnormal or we need to change your treatment, we will call you to review the results.   Testing/Procedures: Your physician has requested that you have an exercise tolerance test. For further information please visit [email or web address]. Please also follow instruction sheet, as given.    Follow-Up: At [facility], you and your health needs are our priority.  As part of our continuing mission to provide you with exceptional heart care, we have created designated Provider Care Teams.  These Care Teams include your primary Cardiologist (physician) and Advanced Practice Providers (APPs -  Physician Assistants and Nurse Practitioners) who all work together to provide you with the care you need, when you need it.  We recommend signing up for the patient portal called "MyChart".  Sign up information is provided on this After Visit Summary.  MyChart is used to connect with patients for Virtual Visits (Telemedicine).  Patients are able to view lab/test results, encounter notes, upcoming appointments, etc.  Non-urgent messages can be sent to your provider as well.   To learn more about what you can do with MyChart, go to [email or web address].    Your next appointment:   Follow up with Afib clinic in 3 months - Referral has been placed

## 2021-03-02 NOTE — Progress Notes (Signed)
Patient Care Team: Reynold Bowen, MD as PCP - General (Endocrinology) Roque Cash., MD as Consulting Physician (Obstetrics and Gynecology)   HPI  Victoria Pacheco is a 70 y.o. female seen in followup for paroxysmal afib in the context of breast CA    Continues with episodes of atrial fibrillation a couple of times a month on average; no clear trigger but wonders whether they are associated with stress episodes are lasting longer sometimes as long as 2 days  Been aware of fullness in her abdomen which described as bloating and a sensation that when she bends over there may be some fluid.  Mild DOE but no chest pain.  Nocturnal dyspnea orthopnea.  Had been started on Xarelto by Dr. Forde Dandy.  We changed it to Eliquis.   No bleeding     DATE TEST EF    5/21 Echo   65-70 % LA  Normal              Thromboembolic risk factors ( age -5, Gender-1) for a CHADSVASc Score of >=2  Records and Results Reviewed   Past Medical History:  Diagnosis Date   Allergic rhinitis    Asthma    related to allergy to cockateil (bird)    Cancer [facility])    Breast- stage one , stereotactic core biopsy - 08/2017 & again in 09/2017   Diffuse cystic mastopathy    Family history of breast cancer    GERD (gastroesophageal reflux disease)    Headache    hx of cluster headache , better since post menopausal   History of kidney stones    passed spontaneously - fr. a dietary supplement    Hx of colonic polyps    Hypercholesteremia    Hypothyroidism 1989   thyroid scan done with radio iodine    Left knee injury    Multiple lipomas    Osteopenia    Pneumonia    not hosp. - treated with antibiotics ( several courses)    Unspecified menopausal and postmenopausal disorder    Vitamin D deficiency     Past Surgical History:  Procedure Laterality Date   benign right breast biopsy  2002   BREAST LUMPECTOMY WITH RADIOACTIVE SEED AND SENTINEL LYMPH NODE BIOPSY Right 10/10/2017   Procedure: BREAST  LUMPECTOMY WITH RADIOACTIVE SEED AND SENTINEL LYMPH NODE BIOPSY;  Surgeon: Rolm Bookbinder, MD;  Location: [place];  Service: General;  Laterality: Right;   TONSILLECTOMY AND ADENOIDECTOMY     at age 89   WISDOM TOOTH EXTRACTION     as a teen    Current Meds  Medication Sig   acetaminophen (TYLENOL) 650 MG CR tablet Take 650 mg by mouth daily as needed for pain.   anastrozole (ARIMIDEX) 1 MG tablet Take 1 mg by mouth daily.   apixaban (ELIQUIS) 5 MG TABS tablet Take 1 tablet (5 mg total) by mouth 2 (two) times daily.   calcium carbonate (TUMS - DOSED IN MG ELEMENTAL CALCIUM) 500 MG chewable tablet Chew 1 tablet by mouth daily as needed for indigestion or heartburn.   Cholecalciferol (VITAMIN D-3) 1000 UNITS CAPS Take 1,000 Units by mouth daily.    famotidine (PEPCID) 20 MG tablet Take 10 mg by mouth 2 (two) times daily.    fluticasone (FLONASE) 50 MCG/ACT nasal spray Place 1 spray into both nostrils daily as needed for allergies or rhinitis.   Homeopathic Products (ARNICARE ARNICA) CREA Apply 1 application topically daily as needed (bruising).  levothyroxine (SYNTHROID, LEVOTHROID) 100 MCG tablet TAKE 1 TABLET BY MOUTH EVERY DAY   loratadine (CLARITIN) 10 MG tablet Take 10 mg by mouth daily as needed for allergies.   Multiple Vitamin (MULTI-VITAMIN) tablet Take 1 tablet by mouth as needed.   Probiotic Product (ALIGN PO) Take 1 tablet by mouth daily.   sodium chloride (OCEAN) 0.65 % SOLN nasal spray Place 1 spray into both nostrils as needed for congestion.   verapamil (CALAN) 40 MG tablet Take 1 tablet by mouth q1hr x 3 for tachycardia    Allergies  Allergen Reactions   Bee Venom Anaphylaxis   Coconut Fatty Acids     Irritation of mouth,. Minimal swelling in tongue & throat   Lipitor [Atorvastatin]     Cramps in hands    Omeprazole Nausea And Vomiting    Can take for only 2 days. 3rd day will have severe nausea and vomiting   Pantoprazole     Can take for only 2 days. 3rd day will  have severe nausea and vomiting   Sulfamethoxazole-Trimethoprim Hives and Itching   Adhesive [Tape] Rash      Review of Systems negative except from HPI and PMH  Physical Exam BP 128/80   Pulse 69   Ht 5' 4.5" (1.638 m)   Wt 177 lb 3.2 oz (80.4 kg)   SpO2 98%   BMI 29.95 kg/m  Well developed and nourished in no acute distress HENT normal Neck supple with JVP-  flat 7 cm Clear Regular rate and rhythm, no murmurs or gallops Abd-soft with active BS No Clubbing cyanosis edema Skin-warm and dry A & Oriented  Grossly normal sensory and motor function  ECG sinus at 63 Intervals 14/07/35 Nonspecific T wave changes  CrCl cannot be calculated (Patient's most recent lab result is older than the maximum 21 days allowed.).   Assessment and  Plan  Atrial fibrillation RVR paroxysmal   Intraatrial lipomatous septum   HFpEF  Breast Ca   Patient is having increasingly burdensome atrial fibrillation, noted mostly in prolonged duration.  We again discussed catheter ablation and drug strategies and have elected to pursue a rhythm controlling strategy.  We will begin with flecainide 50 mg twice daily.  We have discussed proarrhythmia and will plan a treadmill in about 2-3 weeks in A. Fib clinic  follow-up in about 3 months.  She is mildly volume overloaded.  Her abdominal bloating may be a manifestation as is the bendopnea.  Neck veins are full.  We will begin her on Lasix 20 mg daily for 3 days and then to be used as needed.  We discussed the importance of salt restriction.  She is already doing a good job on fluid restriction.  Cramping at night.  We will try mag oxide at 400    Current medicines are reviewed at length with the patient today .  The patient does not  have concerns regarding medicines.

## 2021-03-10 ENCOUNTER — Other Ambulatory Visit: Payer: Self-pay

## 2021-03-10 ENCOUNTER — Ambulatory Visit (INDEPENDENT_AMBULATORY_CARE_PROVIDER_SITE_OTHER): Payer: Medicare Other

## 2021-03-10 DIAGNOSIS — I480 Paroxysmal atrial fibrillation: Secondary | ICD-10-CM | POA: Diagnosis not present

## 2021-03-10 LAB — EXERCISE TOLERANCE TEST
Estimated workload: 4.6 METS
Exercise duration (min): 2 min
Exercise duration (sec): 38 s
MPHR: 151 {beats}/min
Peak HR: 155 {beats}/min
Percent HR: 102 %
RPE: 17
Rest HR: 155 {beats}/min

## 2021-03-14 ENCOUNTER — Other Ambulatory Visit: Payer: Self-pay | Admitting: Internal Medicine

## 2021-05-28 DIAGNOSIS — L720 Epidermal cyst: Secondary | ICD-10-CM | POA: Diagnosis not present

## 2021-06-03 ENCOUNTER — Other Ambulatory Visit: Payer: Self-pay

## 2021-06-03 ENCOUNTER — Ambulatory Visit (HOSPITAL_COMMUNITY)
Admission: RE | Admit: 2021-06-03 | Discharge: 2021-06-03 | Disposition: A | Discharge status: Home / Self Care | Payer: Medicare Other | Source: Ambulatory Visit | Attending: Physician Assistant | Admitting: Physician Assistant

## 2021-06-03 ENCOUNTER — Encounter (HOSPITAL_COMMUNITY): Payer: Self-pay | Admitting: Physician Assistant

## 2021-06-03 VITALS — BP 128/80 | HR 68 | Ht 64.5 in | Wt 182.0 lb

## 2021-06-03 DIAGNOSIS — Z79899 Other long term (current) drug therapy: Secondary | ICD-10-CM | POA: Insufficient documentation

## 2021-06-03 DIAGNOSIS — Z79811 Long term (current) use of aromatase inhibitors: Secondary | ICD-10-CM | POA: Diagnosis not present

## 2021-06-03 DIAGNOSIS — J45909 Unspecified asthma, uncomplicated: Secondary | ICD-10-CM | POA: Diagnosis not present

## 2021-06-03 DIAGNOSIS — R0683 Snoring: Secondary | ICD-10-CM | POA: Insufficient documentation

## 2021-06-03 DIAGNOSIS — Z6830 Body mass index (BMI) 30.0-30.9, adult: Secondary | ICD-10-CM | POA: Insufficient documentation

## 2021-06-03 DIAGNOSIS — Z8616 Personal history of COVID-19: Secondary | ICD-10-CM | POA: Insufficient documentation

## 2021-06-03 DIAGNOSIS — Z79890 Hormone replacement therapy (postmenopausal): Secondary | ICD-10-CM | POA: Diagnosis not present

## 2021-06-03 DIAGNOSIS — Z7901 Long term (current) use of anticoagulants: Secondary | ICD-10-CM | POA: Diagnosis not present

## 2021-06-03 DIAGNOSIS — I480 Paroxysmal atrial fibrillation: Secondary | ICD-10-CM | POA: Diagnosis not present

## 2021-06-03 DIAGNOSIS — E785 Hyperlipidemia, unspecified: Secondary | ICD-10-CM | POA: Insufficient documentation

## 2021-06-03 DIAGNOSIS — R400 Somnolence: Secondary | ICD-10-CM | POA: Diagnosis not present

## 2021-06-03 DIAGNOSIS — E039 Hypothyroidism, unspecified: Secondary | ICD-10-CM | POA: Diagnosis not present

## 2021-06-03 DIAGNOSIS — E669 Obesity, unspecified: Secondary | ICD-10-CM | POA: Insufficient documentation

## 2021-06-03 DIAGNOSIS — Z888 Allergy status to other drugs, medicaments and biological substances status: Secondary | ICD-10-CM | POA: Diagnosis not present

## 2021-06-03 MED ORDER — DILTIAZEM HCL ER COATED BEADS 120 MG PO CP24: 120.0000 mg | ORAL_CAPSULE | Freq: Every day | ORAL | 11 refills | Status: DC

## 2021-06-03 NOTE — Progress Notes (Signed)
Primary Care Physician: Reynold Bowen, MD Primary Electrophysiologist: Dr Caryl Comes Referring Physician: Dr Vernetta Honey is a 70 y.o. female with a history of asthma, HLD, hypothyroidism, atrial fibrillation who presents for follow up in the [facility]. Patient is on Eliquis for a CHADS2VASC score of 2. Patient started on flecainide 03/02/21 for recurrent episodes of afib. Patient reports that she has had 3 episodes of afib since then. One episode occurred in the setting of COVID infection and another after the death of of a close family member. Her heart rate is 140-150 when in afib. Patient denies alcohol use but does admit to snoring and daytime somnolence.   Today, she denies symptoms of palpitations, chest pain, shortness of breath, orthopnea, PND, lower extremity edema, dizziness, presyncope, syncope, bleeding, or neurologic sequela. The patient is tolerating medications without difficulties and is otherwise without complaint today.    Atrial Fibrillation Risk Factors:  she does have symptoms or diagnosis of sleep apnea. she is agreeable to sleep study. she does not have a history of rheumatic fever. she does not have a history of alcohol use. The patient does not have a history of early familial atrial fibrillation or other arrhythmias.  she has a BMI of Body mass index is 30.76 kg/m.Marland Kitchen Filed Weights   06/03/21 1333  Weight: 82.6 kg    Family History  Problem Relation Age of Onset   Colon polyps Father 24   Hypertension Father    Alzheimer's disease Father        Deceased-95   Breast cancer Mother 31       diagnosed second time at 59; Deceased-61   Healthy Daughter    Alzheimer's disease Paternal Aunt    Throat cancer Paternal Uncle    Lung cancer Paternal Uncle    Prostate cancer Paternal Uncle    Lung cancer Maternal Uncle    Stroke Maternal Grandmother    Bladder Cancer Maternal Grandfather    Heart attack Paternal Grandmother     Colon cancer Neg Hx      Atrial Fibrillation Management history:  Previous antiarrhythmic drugs: flecainide  Previous cardioversions: none Previous ablations: none CHADS2VASC score: 2 Anticoagulation history: Xarelto, Eliquis   Past Medical History:  Diagnosis Date   Allergic rhinitis    Asthma    related to allergy to cockateil (bird)    Cancer (HCC)    Breast- stage one , stereotactic core biopsy - 08/2017 & again in 09/2017   Diffuse cystic mastopathy    Family history of breast cancer    GERD (gastroesophageal reflux disease)    Headache    hx of cluster headache , better since post menopausal   History of kidney stones    passed spontaneously - fr. a dietary supplement    Hx of colonic polyps    Hypercholesteremia    Hypothyroidism 1989   thyroid scan done with radio iodine    Left knee injury    Multiple lipomas    Osteopenia    Pneumonia    not hosp. - treated with antibiotics ( several courses)    Unspecified menopausal and postmenopausal disorder    Vitamin D deficiency    Past Surgical History:  Procedure Laterality Date   benign right breast biopsy  2002   BREAST LUMPECTOMY WITH RADIOACTIVE SEED AND SENTINEL LYMPH NODE BIOPSY Right 10/10/2017   Procedure: BREAST LUMPECTOMY WITH RADIOACTIVE SEED AND SENTINEL LYMPH NODE BIOPSY;  Surgeon: Rolm Bookbinder, MD;  Location[facility];  Service: General;  Laterality: Right;   TONSILLECTOMY AND ADENOIDECTOMY     at age 83   WISDOM TOOTH EXTRACTION     as a teen    Current Outpatient Medications  Medication Sig Dispense Refill   acetaminophen (TYLENOL) 650 MG CR tablet Take 650 mg by mouth daily as needed for pain.     anastrozole (ARIMIDEX) 1 MG tablet Take 1 mg by mouth daily.     apixaban (ELIQUIS) 5 MG TABS tablet Take 1 tablet (5 mg total) by mouth 2 (two) times daily. 60 tablet 5   calcium carbonate (TUMS - DOSED IN MG ELEMENTAL CALCIUM) 500 MG chewable tablet Chew 1 tablet by mouth daily as needed for  indigestion or heartburn.     Cholecalciferol (VITAMIN D-3) 1000 UNITS CAPS Take 1,000 Units by mouth daily.      diphenhydrAMINE (BENADRYL) 25 MG tablet Take 25 mg by mouth every 6 (six) hours as needed for itching or allergies.     famotidine (PEPCID) 20 MG tablet Take 10 mg by mouth 2 (two) times daily.      flecainide (TAMBOCOR) 50 MG tablet Take 1 tablet (50 mg total) by mouth 2 (two) times daily. 180 tablet 3   fluticasone (FLONASE) 50 MCG/ACT nasal spray Place 1 spray into both nostrils daily as needed for allergies or rhinitis.     furosemide (LASIX) 20 MG tablet TAKE 1 TABLET BY MOUTH AS NEEDED 20 tablet 3   Homeopathic Products (ARNICARE ARNICA) CREA Apply 1 application topically daily as needed (bruising).     levothyroxine (SYNTHROID, LEVOTHROID) 100 MCG tablet TAKE 1 TABLET BY MOUTH EVERY DAY 30 tablet 0   loratadine (CLARITIN) 10 MG tablet Take 10 mg by mouth daily as needed for allergies.     Magnesium Oxide 400 MG CAPS Take 1 capsule (400 mg total) by mouth daily. 90 capsule 0   Multiple Vitamin (MULTI-VITAMIN) tablet Take 1 tablet by mouth as needed.     Probiotic Product (ALIGN PO) Take 1 tablet by mouth daily.     sodium chloride (OCEAN) 0.65 % SOLN nasal spray Place 1 spray into both nostrils as needed for congestion.     No current facility-administered medications for this encounter.    Allergies  Allergen Reactions   Bee Venom Anaphylaxis   Coconut Fatty Acids     Irritation of mouth,. Minimal swelling in tongue & throat   Lipitor [Atorvastatin]     Cramps in hands    Omeprazole Nausea And Vomiting    Can take for only 2 days. 3rd day will have severe nausea and vomiting   Pantoprazole     Can take for only 2 days. 3rd day will have severe nausea and vomiting   Sulfamethoxazole-Trimethoprim Hives and Itching   Adhesive [Tape] Rash    Social History   Socioeconomic History   Marital status: Married    Spouse name: james x 40 + yrs   Number of children: 1    Years of education: Not on file   Highest education level: Not on file  Occupational History   Not on file  Tobacco Use   Smoking status: Never   Smokeless tobacco: Never  Vaping Use   Vaping Use: Never used  Substance and Sexual Activity   Alcohol use: Yes    Comment: social use   Drug use: No   Sexual activity: Not on file  Other Topics Concern   Not on file  Social  History Narrative   Not on file   Social Determinants of Health   Financial Resource Strain: Not on file  Food Insecurity: Not on file  Transportation Needs: Not on file  Physical Activity: Not on file  Stress: Not on file  Social Connections: Not on file  Intimate Partner Violence: Not on file     ROS- All systems are reviewed and negative except as per the HPI above.  Physical Exam: Vitals:   06/03/21 1333  BP: 128/80  Pulse: 68  Weight: 82.6 kg  Height: 5' 4.5" (1.638 m)    GEN- The patient is a well appearing obese female, alert and oriented x 3 today.   Head- normocephalic, atraumatic Eyes-  Sclera clear, conjunctiva pink Ears- hearing intact Oropharynx- clear Neck- supple  Lungs- Clear to ausculation bilaterally, normal work of breathing Heart- Regular rate and rhythm, no murmurs, rubs or gallops  GI- soft, NT, ND, + BS Extremities- no clubbing, cyanosis, or edema MS- no significant deformity or atrophy Skin- no rash or lesion Psych- euthymic mood, full affect Neuro- strength and sensation are intact  Wt Readings from Last 3 Encounters:  06/03/21 82.6 kg  03/02/21 80.4 kg  08/08/20 80.8 kg    EKG today demonstrates  SR, NST Vent. rate 68 BPM PR interval 154 ms QRS duration 74 ms QT/QTcB 340/361 ms  Echo 01/31/20 demonstrated   1. Left ventricular ejection fraction, by estimation, is 65 to 70%. The  left ventricle has normal function. The left ventricle has no regional  wall motion abnormalities. Left ventricular diastolic parameters were  normal. The average left ventricular  global longitudinal strain is -24.4 %. The global longitudinal strain is normal.   2. Right ventricular systolic function is normal. The right ventricular  size is normal. Tricuspid regurgitation signal is inadequate for assessing PA pressure.   3. The mitral valve is normal in structure. Trivial mitral valve  regurgitation. No evidence of mitral stenosis.   4. The aortic valve is tricuspid. Aortic valve regurgitation is trivial.  No aortic stenosis is present.   5. The inferior vena cava is normal in size with greater than 50%  respiratory variability, suggesting right atrial pressure of 3 mmHg.   Epic records are reviewed at length today  CHA2DS2-VASc Score = 2  The patient's score is based upon: CHF History: 0 HTN History: 0 Diabetes History: 0 Stroke History: 0 Vascular Disease History: 0 Age Score: 1 Gender Score: 1      ASSESSMENT AND PLAN: 1. Paroxysmal Atrial Fibrillation (ICD10:  I48.0) The patient's CHA2DS2-VASc score is 2, indicating a 2.2% annual risk of stroke.   Patient improved since starting flecainide but still having episodes of rapid afib.  Continue flecainide 50 mg BID Start diltiazem 120 mg daily  2. Obesity Body mass index is 30.76 kg/m. Lifestyle modification was discussed at length including regular exercise and weight reduction.  3. Snoring/daytime somnolence The importance of adequate treatment of sleep apnea was discussed today in order to improve our ability to maintain sinus rhythm long term. Will refer for sleep study.    Follow up in the AF clinic in one month.    [facility] [address] [place], [place] [address] [phone] 06/03/2021 1:44 PM

## 2021-06-13 DIAGNOSIS — Z23 Encounter for immunization: Secondary | ICD-10-CM | POA: Diagnosis not present

## 2021-06-23 DIAGNOSIS — K5900 Constipation, unspecified: Secondary | ICD-10-CM | POA: Diagnosis not present

## 2021-07-01 ENCOUNTER — Encounter (HOSPITAL_COMMUNITY): Payer: Self-pay | Admitting: Physician Assistant

## 2021-07-01 ENCOUNTER — Ambulatory Visit (HOSPITAL_COMMUNITY)
Admission: RE | Admit: 2021-07-01 | Discharge: 2021-07-01 | Disposition: A | Discharge status: Home / Self Care | Payer: Medicare Other | Source: Ambulatory Visit | Attending: Physician Assistant | Admitting: Physician Assistant

## 2021-07-01 ENCOUNTER — Other Ambulatory Visit: Payer: Self-pay

## 2021-07-01 VITALS — BP 136/82 | HR 69 | Ht 64.5 in | Wt 180.8 lb

## 2021-07-01 DIAGNOSIS — Z79899 Other long term (current) drug therapy: Secondary | ICD-10-CM | POA: Insufficient documentation

## 2021-07-01 DIAGNOSIS — R0683 Snoring: Secondary | ICD-10-CM | POA: Insufficient documentation

## 2021-07-01 DIAGNOSIS — Z8249 Family history of ischemic heart disease and other diseases of the circulatory system: Secondary | ICD-10-CM | POA: Insufficient documentation

## 2021-07-01 DIAGNOSIS — Z09 Encounter for follow-up examination after completed treatment for conditions other than malignant neoplasm: Secondary | ICD-10-CM | POA: Diagnosis not present

## 2021-07-01 DIAGNOSIS — Z8616 Personal history of COVID-19: Secondary | ICD-10-CM | POA: Diagnosis not present

## 2021-07-01 DIAGNOSIS — Z6830 Body mass index (BMI) 30.0-30.9, adult: Secondary | ICD-10-CM | POA: Insufficient documentation

## 2021-07-01 DIAGNOSIS — E039 Hypothyroidism, unspecified: Secondary | ICD-10-CM | POA: Diagnosis not present

## 2021-07-01 DIAGNOSIS — Z7901 Long term (current) use of anticoagulants: Secondary | ICD-10-CM | POA: Insufficient documentation

## 2021-07-01 DIAGNOSIS — I480 Paroxysmal atrial fibrillation: Secondary | ICD-10-CM | POA: Diagnosis not present

## 2021-07-01 DIAGNOSIS — E669 Obesity, unspecified: Secondary | ICD-10-CM | POA: Diagnosis not present

## 2021-07-01 DIAGNOSIS — R400 Somnolence: Secondary | ICD-10-CM | POA: Diagnosis not present

## 2021-07-01 DIAGNOSIS — E785 Hyperlipidemia, unspecified: Secondary | ICD-10-CM | POA: Diagnosis not present

## 2021-07-01 NOTE — Progress Notes (Signed)
Primary Care Physician: Reynold Bowen, MD Primary Electrophysiologist: Dr Caryl Comes Referring Physician: Dr Vernetta Honey is a 70 y.o. female with a history of asthma, HLD, hypothyroidism, atrial fibrillation who presents for follow up in the [facility]. Patient is on Eliquis for a CHADS2VASC score of 2. Patient started on flecainide 03/02/21 for recurrent episodes of afib. Patient reports that she has had 3 episodes of afib since then. One episode occurred in the setting of COVID infection and another after the death of of a close family member. Her heart rate is 140-150 when in afib. Patient denies alcohol use but does admit to snoring and daytime somnolence.   On follow up today, patient reports that she has had two episodes of afib since her last visit, each lasting about 24 hours. There were no specific triggers for these episodes. She also had some constipation starting diltiazem.   Today, she denies symptoms of chest pain, shortness of breath, orthopnea, PND, lower extremity edema, dizziness, presyncope, syncope, bleeding, or neurologic sequela. The patient is tolerating medications without difficulties and is otherwise without complaint today.    Atrial Fibrillation Risk Factors:  she does have symptoms or diagnosis of sleep apnea. she is agreeable to sleep study. she does not have a history of rheumatic fever. she does not have a history of alcohol use. The patient does not have a history of early familial atrial fibrillation or other arrhythmias.  she has a BMI of Body mass index is 30.55 kg/m.Marland Kitchen Filed Weights   07/01/21 1408  Weight: 82 kg    Family History  Problem Relation Age of Onset   Colon polyps Father 50   Hypertension Father    Alzheimer's disease Father        Deceased-95   Breast cancer Mother 4       diagnosed second time at 53; Deceased-61   Healthy Daughter    Alzheimer's disease Paternal Aunt    Throat cancer Paternal  Uncle    Lung cancer Paternal Uncle    Prostate cancer Paternal Uncle    Lung cancer Maternal Uncle    Stroke Maternal Grandmother    Bladder Cancer Maternal Grandfather    Heart attack Paternal Grandmother    Colon cancer Neg Hx      Atrial Fibrillation Management history:  Previous antiarrhythmic drugs: flecainide  Previous cardioversions: none Previous ablations: none CHADS2VASC score: 2 Anticoagulation history: Xarelto, Eliquis   Past Medical History:  Diagnosis Date   Allergic rhinitis    Asthma    related to allergy to cockateil (bird)    Cancer (HCC)    Breast- stage one , stereotactic core biopsy - 08/2017 & again in 09/2017   Diffuse cystic mastopathy    Family history of breast cancer    GERD (gastroesophageal reflux disease)    Headache    hx of cluster headache , better since post menopausal   History of kidney stones    passed spontaneously - fr. a dietary supplement    Hx of colonic polyps    Hypercholesteremia    Hypothyroidism 1989   thyroid scan done with radio iodine    Left knee injury    Multiple lipomas    Osteopenia    Pneumonia    not hosp. - treated with antibiotics ( several courses)    Unspecified menopausal and postmenopausal disorder    Vitamin D deficiency    Past Surgical History:  Procedure Laterality Date  benign right breast biopsy  2002   BREAST LUMPECTOMY WITH RADIOACTIVE SEED AND SENTINEL LYMPH NODE BIOPSY Right 10/10/2017   Procedure: BREAST LUMPECTOMY WITH RADIOACTIVE SEED AND SENTINEL LYMPH NODE BIOPSY;  Surgeon: Rolm Bookbinder, MD;  Location: [facility];  Service: General;  Laterality: Right;   TONSILLECTOMY AND ADENOIDECTOMY     at age 49   WISDOM TOOTH EXTRACTION     as a teen    Current Outpatient Medications  Medication Sig Dispense Refill   acetaminophen (TYLENOL) 650 MG CR tablet Take 650 mg by mouth daily as needed for pain.     anastrozole (ARIMIDEX) 1 MG tablet Take 1 mg by mouth daily.     apixaban (ELIQUIS) 5  MG TABS tablet Take 1 tablet (5 mg total) by mouth 2 (two) times daily. 60 tablet 5   calcium carbonate (TUMS - DOSED IN MG ELEMENTAL CALCIUM) 500 MG chewable tablet Chew 1 tablet by mouth daily as needed for indigestion or heartburn.     Cholecalciferol (VITAMIN D-3) 1000 UNITS CAPS Take 1,000 Units by mouth daily.      diltiazem (CARDIZEM CD) 120 MG 24 hr capsule Take 1 capsule (120 mg total) by mouth daily. 30 capsule 11   diphenhydrAMINE (BENADRYL) 25 MG tablet Take 25 mg by mouth every 6 (six) hours as needed for itching or allergies.     famotidine (PEPCID) 20 MG tablet Take 10 mg by mouth 2 (two) times daily.      flecainide (TAMBOCOR) 50 MG tablet Take 1 tablet (50 mg total) by mouth 2 (two) times daily. 180 tablet 3   fluticasone (FLONASE) 50 MCG/ACT nasal spray Place 1 spray into both nostrils daily as needed for allergies or rhinitis.     furosemide (LASIX) 20 MG tablet TAKE 1 TABLET BY MOUTH AS NEEDED 20 tablet 3   Homeopathic Products (ARNICARE ARNICA) CREA Apply 1 application topically daily as needed (bruising).     levothyroxine (SYNTHROID, LEVOTHROID) 100 MCG tablet TAKE 1 TABLET BY MOUTH EVERY DAY 30 tablet 0   loratadine (CLARITIN) 10 MG tablet Take 10 mg by mouth daily as needed for allergies.     Magnesium Oxide 400 MG CAPS Take 1 capsule (400 mg total) by mouth daily. 90 capsule 0   Multiple Vitamin (MULTI-VITAMIN) tablet Take 1 tablet by mouth as needed.     Probiotic Product (ALIGN PO) Take 1 tablet by mouth daily.     sodium chloride (OCEAN) 0.65 % SOLN nasal spray Place 1 spray into both nostrils as needed for congestion.     No current facility-administered medications for this encounter.    Allergies  Allergen Reactions   Bee Venom Anaphylaxis   Coconut Fatty Acids     Irritation of mouth,. Minimal swelling in tongue & throat   Lipitor [Atorvastatin]     Cramps in hands    Omeprazole Nausea And Vomiting    Can take for only 2 days. 3rd day will have severe  nausea and vomiting   Pantoprazole     Can take for only 2 days. 3rd day will have severe nausea and vomiting   Sulfamethoxazole-Trimethoprim Hives and Itching   Adhesive [Tape] Rash    Social History   Socioeconomic History   Marital status: Married    Spouse name: james x 40 + yrs   Number of children: 1   Years of education: Not on file   Highest education level: Not on file  Occupational History   Not on file  Tobacco Use   Smoking status: Never   Smokeless tobacco: Never  Vaping Use   Vaping Use: Never used  Substance and Sexual Activity   Alcohol use: Yes    Comment: social use   Drug use: No   Sexual activity: Not on file  Other Topics Concern   Not on file  Social History Narrative   Not on file   Social Determinants of Health   Financial Resource Strain: Not on file  Food Insecurity: Not on file  Transportation Needs: Not on file  Physical Activity: Not on file  Stress: Not on file  Social Connections: Not on file  Intimate Partner Violence: Not on file     ROS- All systems are reviewed and negative except as per the HPI above.  Physical Exam: Vitals:   07/01/21 1408  BP: 136/82  Pulse: 69  Weight: 82 kg  Height: 5' 4.5" (1.638 m)    GEN- The patient is a well appearing obese female, alert and oriented x 3 today.   HEENT-head normocephalic, atraumatic, sclera clear, conjunctiva pink, hearing intact, trachea midline. Lungs- Clear to ausculation bilaterally, normal work of breathing Heart- Regular rate and rhythm, no murmurs, rubs or gallops  GI- soft, NT, ND, + BS Extremities- no clubbing, cyanosis, or edema MS- no significant deformity or atrophy Skin- no rash or lesion Psych- euthymic mood, full affect Neuro- strength and sensation are intact   Wt Readings from Last 3 Encounters:  07/01/21 82 kg  06/03/21 82.6 kg  03/02/21 80.4 kg    EKG today demonstrates  SR Vent. rate 69 BPM PR interval 158 ms QRS duration 74 ms QT/QTcB  376/402 ms  Echo 01/31/20 demonstrated   1. Left ventricular ejection fraction, by estimation, is 65 to 70%. The  left ventricle has normal function. The left ventricle has no regional  wall motion abnormalities. Left ventricular diastolic parameters were  normal. The average left ventricular global longitudinal strain is -24.4 %. The global longitudinal strain is normal.   2. Right ventricular systolic function is normal. The right ventricular  size is normal. Tricuspid regurgitation signal is inadequate for assessing PA pressure.   3. The mitral valve is normal in structure. Trivial mitral valve  regurgitation. No evidence of mitral stenosis.   4. The aortic valve is tricuspid. Aortic valve regurgitation is trivial.  No aortic stenosis is present.   5. The inferior vena cava is normal in size with greater than 50%  respiratory variability, suggesting right atrial pressure of 3 mmHg.   Epic records are reviewed at length today  CHA2DS2-VASc Score = 2  The patient's score is based upon: CHF History: 0 HTN History: 0 Diabetes History: 0 Stroke History: 0 Vascular Disease History: 0 Age Score: 1 Gender Score: 1      ASSESSMENT AND PLAN: 1. Paroxysmal Atrial Fibrillation (ICD10:  I48.0) The patient's CHA2DS2-VASc score is 2, indicating a 2.2% annual risk of stroke.   We discussed rhythm control options including increasing flecainide vs ablation vs continuing present therapy. Patient currently happy with her present therapy.  Continue flecainide 50 mg BID Continue diltiazem 120 mg daily  2. Obesity Body mass index is 30.55 kg/m. Lifestyle modification was discussed and encouraged including regular physical activity and weight reduction.  3. Snoring/daytime somnolence Referred for sleep study.   Follow up with Dr Caryl Comes in 3-4 months. AF clinic in 6 months.    [facility] [place], [place]  [id number] [phone] 07/01/2021 3:15 PM

## 2021-07-03 DIAGNOSIS — E039 Hypothyroidism, unspecified: Secondary | ICD-10-CM | POA: Diagnosis not present

## 2021-07-03 DIAGNOSIS — E559 Vitamin D deficiency, unspecified: Secondary | ICD-10-CM | POA: Diagnosis not present

## 2021-07-03 DIAGNOSIS — E785 Hyperlipidemia, unspecified: Secondary | ICD-10-CM | POA: Diagnosis not present

## 2021-07-05 DIAGNOSIS — Z23 Encounter for immunization: Secondary | ICD-10-CM | POA: Diagnosis not present

## 2021-07-08 ENCOUNTER — Telehealth (HOSPITAL_COMMUNITY): Payer: Self-pay | Admitting: *Deleted

## 2021-07-08 MED ORDER — METOPROLOL SUCCINATE ER 25 MG PO TB24: 25.0000 mg | ORAL_TABLET | Freq: Every day | ORAL | 3 refills | Status: DC

## 2021-07-08 NOTE — Telephone Encounter (Signed)
Pt called in stating she is having severe constipation with cardizem. Discussed with Adline Peals PA will stop cardizem and start metoprolol succinate 25mg  once a day. Pt in agreement.

## 2021-07-09 ENCOUNTER — Other Ambulatory Visit: Payer: Self-pay | Admitting: Internal Medicine

## 2021-07-09 NOTE — Telephone Encounter (Signed)
Prescription refill request for Eliquis received.  Indication: afib  Last office visit: Caryl Comes 03/02/2021 Scr:1.12, 01/07/2021 Age: 70 yo  Weight: 82 kg   Refill sent

## 2021-07-14 DIAGNOSIS — C50511 Malignant neoplasm of lower-outer quadrant of right female breast: Secondary | ICD-10-CM | POA: Diagnosis not present

## 2021-07-14 DIAGNOSIS — Z170 Estrogen receptor positive status [ER+]: Secondary | ICD-10-CM | POA: Diagnosis not present

## 2021-07-15 DIAGNOSIS — K219 Gastro-esophageal reflux disease without esophagitis: Secondary | ICD-10-CM | POA: Diagnosis not present

## 2021-07-15 DIAGNOSIS — Z1339 Encounter for screening examination for other mental health and behavioral disorders: Secondary | ICD-10-CM | POA: Diagnosis not present

## 2021-07-15 DIAGNOSIS — C50911 Malignant neoplasm of unspecified site of right female breast: Secondary | ICD-10-CM | POA: Diagnosis not present

## 2021-07-15 DIAGNOSIS — Z1331 Encounter for screening for depression: Secondary | ICD-10-CM | POA: Diagnosis not present

## 2021-07-15 DIAGNOSIS — E785 Hyperlipidemia, unspecified: Secondary | ICD-10-CM | POA: Diagnosis not present

## 2021-07-15 DIAGNOSIS — Z1212 Encounter for screening for malignant neoplasm of rectum: Secondary | ICD-10-CM | POA: Diagnosis not present

## 2021-07-15 DIAGNOSIS — E669 Obesity, unspecified: Secondary | ICD-10-CM | POA: Diagnosis not present

## 2021-07-15 DIAGNOSIS — M8580 Other specified disorders of bone density and structure, unspecified site: Secondary | ICD-10-CM | POA: Diagnosis not present

## 2021-07-15 DIAGNOSIS — R82998 Other abnormal findings in urine: Secondary | ICD-10-CM | POA: Diagnosis not present

## 2021-07-15 DIAGNOSIS — I4891 Unspecified atrial fibrillation: Secondary | ICD-10-CM | POA: Diagnosis not present

## 2021-07-15 DIAGNOSIS — N1831 Chronic kidney disease, stage 3a: Secondary | ICD-10-CM | POA: Diagnosis not present

## 2021-07-15 DIAGNOSIS — Z0000 Encounter for general adult medical examination without abnormal findings: Secondary | ICD-10-CM | POA: Diagnosis not present

## 2021-07-15 DIAGNOSIS — Z23 Encounter for immunization: Secondary | ICD-10-CM | POA: Diagnosis not present

## 2021-07-15 DIAGNOSIS — E039 Hypothyroidism, unspecified: Secondary | ICD-10-CM | POA: Diagnosis not present

## 2021-09-03 DIAGNOSIS — L821 Other seborrheic keratosis: Secondary | ICD-10-CM | POA: Diagnosis not present

## 2021-09-03 DIAGNOSIS — D1721 Benign lipomatous neoplasm of skin and subcutaneous tissue of right arm: Secondary | ICD-10-CM | POA: Diagnosis not present

## 2021-09-03 DIAGNOSIS — D1724 Benign lipomatous neoplasm of skin and subcutaneous tissue of left leg: Secondary | ICD-10-CM | POA: Diagnosis not present

## 2021-09-03 DIAGNOSIS — D1801 Hemangioma of skin and subcutaneous tissue: Secondary | ICD-10-CM | POA: Diagnosis not present

## 2021-09-03 DIAGNOSIS — D1722 Benign lipomatous neoplasm of skin and subcutaneous tissue of left arm: Secondary | ICD-10-CM | POA: Diagnosis not present

## 2021-09-22 ENCOUNTER — Encounter: Payer: Self-pay | Admitting: Internal Medicine

## 2021-10-01 ENCOUNTER — Other Ambulatory Visit (HOSPITAL_COMMUNITY): Payer: Self-pay | Admitting: Physician Assistant

## 2021-10-06 ENCOUNTER — Other Ambulatory Visit: Payer: Self-pay

## 2021-10-06 ENCOUNTER — Ambulatory Visit (INDEPENDENT_AMBULATORY_CARE_PROVIDER_SITE_OTHER): Payer: Medicare Other | Admitting: Internal Medicine

## 2021-10-06 ENCOUNTER — Encounter: Payer: Self-pay | Admitting: Internal Medicine

## 2021-10-06 VITALS — BP 124/78 | HR 63 | Ht 64.5 in | Wt 184.6 lb

## 2021-10-06 DIAGNOSIS — R0683 Snoring: Secondary | ICD-10-CM

## 2021-10-06 DIAGNOSIS — I5030 Unspecified diastolic (congestive) heart failure: Secondary | ICD-10-CM | POA: Insufficient documentation

## 2021-10-06 DIAGNOSIS — R400 Somnolence: Secondary | ICD-10-CM | POA: Diagnosis not present

## 2021-10-06 DIAGNOSIS — I5032 Chronic diastolic (congestive) heart failure: Secondary | ICD-10-CM

## 2021-10-06 DIAGNOSIS — I480 Paroxysmal atrial fibrillation: Secondary | ICD-10-CM

## 2021-10-06 NOTE — Patient Instructions (Signed)
Medication Instructions:  Your physician has recommended you make the following change in your medication:   You are being given three prescriptions for different beta blockers to try.  You may take these in any order. DO NOT TAKE MORE THAN ONE BETA BLOCKER AT A TIME. Choose one beta blocker to start with. If after two weeks, you feel the medication is working well for you, continue that current medication. If it does not work well for you, try another prescription for a beta blocker at that time. You may continue that current beta blocker at that time if it works well for you. If it does not, repeat these instructions for the third beta blocker.  When you find a beta blocker that works well for you, call the office and we will send in a prescription for you.   ** You may start with a 1/2 tablet by mouth daily  Bisoprolol 5mg  - 1 tablet by mouth daily  Nebivolol 5mg  - 1 tablet by mouth daily  Atenolol 50mg  - 1 tablet by mouth daily     *If you need a refill on your cardiac medications before your next appointment, please call your pharmacy*   Lab Work: None ordered.  If you have labs (blood work) drawn today and your tests are completely normal, you will receive your results only by: [facility] (if you have MyChart) OR A paper copy in the mail If you have any lab test that is abnormal or we need to change your treatment, we will call you to review the results.   Testing/Procedures: Your physician has recommended that you have a sleep study. This test records several body functions during sleep, including: brain activity, eye movement, oxygen and carbon dioxide blood levels, heart rate and rhythm, breathing rate and rhythm, the flow of air through your mouth and nose, snoring, body muscle movements, and chest and belly movement.    Follow-Up: At [facility], you and your health needs are our priority.  As part of our continuing mission to provide you with exceptional  heart care, we have created designated Provider Care Teams.  These Care Teams include your primary Cardiologist (physician) and Advanced Practice Providers (APPs -  Physician Assistants and Nurse Practitioners) who all work together to provide you with the care you need, when you need it.  We recommend signing up for the patient portal called "MyChart".  Sign up information is provided on this After Visit Summary.  MyChart is used to connect with patients for Virtual Visits (Telemedicine).  Patients are able to view lab/test results, encounter notes, upcoming appointments, etc.  Non-urgent messages can be sent to your provider as well.   To learn more about what you can do with MyChart, go to [email or web address].    Your next appointment:   Follow up with Dr Caryl Comes in 6 months   Other Instructions Appointment with Dr Quentin Ore November 2023

## 2021-10-06 NOTE — Progress Notes (Signed)
Patient Care Team: Reynold Bowen, MD as PCP - General (Endocrinology) Roque Cash., MD as Consulting Physician (Obstetrics and Gynecology)   HPI  Victoria Pacheco is a 71 y.o. female seen in follow up of paroxysmal afib in the context of breast CA.  Had been started on Xarelto by Dr. Forde Dandy. We changed it to Eliquis. No bleeding.  She remains compliant with flecainide and Eliquis. She was put on diltiazem on September 29th last year, and she reports she experienced constipation. She now takes metoprolol, but she adds she still has to take stool softeners.  The metoprolol has reduced her energy, but she adds that her Afib has been resolved.   She states that she snores, sleep study was anticipated but not completed.. She adds that she has difficulty sleeping.   The patient denies chest pain, shortness of breath, nocturnal dyspnea, orthopnea or peripheral edema.  There have been no palpitations, lightheadedness or syncope.  Complains of constipation and fatigue.   DATE TEST EF    5/21 Echo   65-70 % LA  Normal    7/22 Exercise Tolerance Test     BP had a hypertensive response, no T wave inversion nor ST segment deviation.    DATE PR interval QRSduration Dose  6/22  168 68 0  1/23 178 80 50     DATE Cr K Hgb  10/16  1.17 3.9   1/19   12.7  11/22 1.13 4.3 42.3     Thromboembolic risk factors ( age -79, Gender-1) for a CHADSVASc Score of >=2  Records and Results Reviewed   Past Medical History:  Diagnosis Date   Allergic rhinitis    Asthma    related to allergy to cockateil (bird)    Cancer [facility])    Breast- stage one , stereotactic core biopsy - 08/2017 & again in 09/2017   Diffuse cystic mastopathy    Family history of breast cancer    GERD (gastroesophageal reflux disease)    Headache    hx of cluster headache , better since post menopausal   History of kidney stones    passed spontaneously - fr. a dietary supplement    Hx of colonic polyps     Hypercholesteremia    Hypothyroidism 1989   thyroid scan done with radio iodine    Left knee injury    Multiple lipomas    Osteopenia    Pneumonia    not hosp. - treated with antibiotics ( several courses)    Unspecified menopausal and postmenopausal disorder    Vitamin D deficiency     Past Surgical History:  Procedure Laterality Date   benign right breast biopsy  2002   BREAST LUMPECTOMY WITH RADIOACTIVE SEED AND SENTINEL LYMPH NODE BIOPSY Right 10/10/2017   Procedure: BREAST LUMPECTOMY WITH RADIOACTIVE SEED AND SENTINEL LYMPH NODE BIOPSY;  Surgeon: Rolm Bookbinder, MD;  Location: [facility];  Service: General;  Laterality: Right;   TONSILLECTOMY AND ADENOIDECTOMY     at age 48   WISDOM TOOTH EXTRACTION     as a teen    Current Meds  Medication Sig   acetaminophen (TYLENOL) 650 MG CR tablet Take 650 mg by mouth daily as needed for pain.   anastrozole (ARIMIDEX) 1 MG tablet Take 1 mg by mouth daily.   calcium carbonate (TUMS - DOSED IN MG ELEMENTAL CALCIUM) 500 MG chewable tablet Chew 1 tablet by mouth daily as needed for indigestion or heartburn.   Cholecalciferol (  VITAMIN D-3) 1000 UNITS CAPS Take 1,000 Units by mouth daily.    diphenhydrAMINE (BENADRYL) 25 MG tablet Take 25 mg by mouth every 6 (six) hours as needed for itching or allergies.   ELIQUIS 5 MG TABS tablet TAKE 1 TABLET BY MOUTH TWICE A DAY   famotidine (PEPCID) 20 MG tablet Take 10 mg by mouth 2 (two) times daily.    flecainide (TAMBOCOR) 50 MG tablet Take 1 tablet (50 mg total) by mouth 2 (two) times daily.   fluticasone (FLONASE) 50 MCG/ACT nasal spray Place 1 spray into both nostrils daily as needed for allergies or rhinitis.   furosemide (LASIX) 20 MG tablet TAKE 1 TABLET BY MOUTH AS NEEDED   Homeopathic Products (ARNICARE ARNICA) CREA Apply 1 application topically daily as needed (bruising).   levothyroxine (SYNTHROID, LEVOTHROID) 100 MCG tablet TAKE 1 TABLET BY MOUTH EVERY DAY   loratadine (CLARITIN) 10 MG tablet  Take 10 mg by mouth daily as needed for allergies.   Magnesium Oxide 400 MG CAPS Take 1 capsule (400 mg total) by mouth daily.   metoprolol succinate (TOPROL-XL) 25 MG 24 hr tablet TAKE 1 TABLET BY MOUTH EVERYDAY AT BEDTIME   Multiple Vitamin (MULTI-VITAMIN) tablet Take 1 tablet by mouth as needed.   Probiotic Product (ALIGN PO) Take 1 tablet by mouth daily.   sodium chloride (OCEAN) 0.65 % SOLN nasal spray Place 1 spray into both nostrils as needed for congestion.    Allergies  Allergen Reactions   Bee Venom Anaphylaxis   Coconut Fatty Acids     Irritation of mouth,. Minimal swelling in tongue & throat   Lipitor [Atorvastatin]     Cramps in hands    Omeprazole Nausea And Vomiting    Can take for only 2 days. 3rd day will have severe nausea and vomiting   Pantoprazole     Can take for only 2 days. 3rd day will have severe nausea and vomiting   Sulfamethoxazole-Trimethoprim Hives and Itching   Adhesive [Tape] Rash    ROS: Please see the history of present illness. (+) Constipation (+) Fatigue All other systems are reviewed and negative.    Review of Systems negative except from HPI and PMH  Physical Exam BP 124/78    Pulse 63    Ht 5' 4.5" (1.638 m)    Wt 184 lb 9.6 oz (83.7 kg)    SpO2 98%    BMI 31.20 kg/m  Well developed and nourished in no acute distress HENT normal Neck supple with JVP-  flat  Clear Regular rate and rhythm, no murmurs or gallops Abd-soft with active BS No Clubbing cyanosis edema Skin-warm and dry A & Oriented  Grossly normal sensory and motor function     ECG: Sinus at 63 Intervals 18/08/36  03/02/21 ECG: sinus at 63 Intervals 14/07/35 Nonspecific T wave changes  CrCl cannot be calculated (Patient's most recent lab result is older than the maximum 21 days allowed.).   Assessment and  Plan  Atrial fibrillation RVR paroxysmal   Intraatrial lipomatous septum   HFpEF  Breast Ca   Multiple drug side effects  Atrial fibrillation is  much improved on the flecainide.  We will continue it at 50 mg twice daily.  Conduction interval remain within normal parameters.  Interestingly, the constipation which was noted on diltiazem has persisted despite its discontinuation prompting me to look and to surprisingly find out that flecainide is also associated with constipation.  Metoprolol is associate with significant fatigue.  We will  discontinue it.  I have given a prescription for atenolol 50, nebivolol 5 and bisoprolol 5 to take 1/2 tablet for the first couple of weeks looking for a drug with which there is less fatigue.  Given the issues with medications, we have discussed again the role of catheter ablation and will refer her to Dr. Daune Perch to discuss this as a target of therapy but for now we will use antiarrhythmic therapy as a bridge to ablation.  No bleeding, we will continue the Eliquis at 5 mg twice daily     F/U in 6 months   I,Tinashe Williams,acting as a scribe for Virl Axe, MD.,have documented all relevant documentation on the behalf of Virl Axe, MD,as directed by  Virl Axe, MD while in the presence of Virl Axe, MD.   I, Virl Axe, MD, have reviewed all documentation for this visit. The documentation on 10/06/21 for the exam, diagnosis, procedures, and orders are all accurate and complete.

## 2021-10-09 ENCOUNTER — Telehealth: Payer: Self-pay | Admitting: *Deleted

## 2021-10-09 NOTE — Telephone Encounter (Signed)
-----   Message from Thora Lance, RN sent at 10/06/2021  5:07 PM EST ----- Regarding: home sleep study Please precert and schedule pt.  Thank You,  Dan Europe

## 2021-10-15 NOTE — Telephone Encounter (Signed)
NO PA required ok to schedule

## 2021-10-22 ENCOUNTER — Telehealth: Payer: Self-pay | Admitting: *Deleted

## 2021-10-22 NOTE — Telephone Encounter (Signed)
Spoke with patient and gave her the date, time and location for her sleep study. Patient agreed and was appreciative for the call.

## 2021-11-12 DIAGNOSIS — R922 Inconclusive mammogram: Secondary | ICD-10-CM | POA: Diagnosis not present

## 2021-11-12 DIAGNOSIS — R928 Other abnormal and inconclusive findings on diagnostic imaging of breast: Secondary | ICD-10-CM | POA: Diagnosis not present

## 2021-11-12 DIAGNOSIS — Z853 Personal history of malignant neoplasm of breast: Secondary | ICD-10-CM | POA: Diagnosis not present

## 2021-11-16 ENCOUNTER — Ambulatory Visit (HOSPITAL_BASED_OUTPATIENT_CLINIC_OR_DEPARTMENT_OTHER): Payer: Medicare Other | Attending: Internal Medicine | Admitting: Cardiology

## 2021-11-16 DIAGNOSIS — G4733 Obstructive sleep apnea (adult) (pediatric): Secondary | ICD-10-CM | POA: Insufficient documentation

## 2021-11-16 DIAGNOSIS — R0683 Snoring: Secondary | ICD-10-CM | POA: Insufficient documentation

## 2021-11-16 DIAGNOSIS — R400 Somnolence: Secondary | ICD-10-CM | POA: Insufficient documentation

## 2021-11-18 NOTE — Procedures (Signed)
? ?  Patient Name: Victoria Pacheco, Victoria Pacheco ?Study Date: 11/17/2021 ?Gender: Female ?D.O.B: 06-May-1951 ?Age (years): 89 ?Referring Provider: Virl Axe, MD ?Height (inches): 64 ?Interpreting Physician: Fransico Him MD, ABSM ?Weight (lbs): 182 ?RPSGT: Jacolyn Reedy ?BMI: 31 ?MRN: [id number] ?Neck Size: 14.00 ? ?CLINICAL INFORMATION ?Sleep Study Type: HST ? ?Indication for sleep study: N/A ? ?Epworth Sleepiness Score: 1 ? ?SLEEP STUDY TECHNIQUE ?A multi-channel overnight portable sleep study was performed. The channels recorded were: nasal airflow, thoracic respiratory movement, and oxygen saturation with a pulse oximetry. Snoring was also monitored. ? ?MEDICATIONS ?Patient self administered medications include: N/A. ? ?SLEEP ARCHITECTURE ?Patient was studied for 386 minutes. The sleep efficiency was 100.0 % and the patient was supine for 98.1%. The arousal index was 0.0 per hour. ? ?RESPIRATORY PARAMETERS ?The overall AHI was 15.7 per hour, with a central apnea index of 0 per hour. ? ?The oxygen nadir was 82% during sleep. ? ?CARDIAC DATA ?Mean heart rate during sleep was 57.1 bpm. ? ?IMPRESSIONS ?- Moderate obstructive sleep apnea occurred during this study (AHI = 15.7/h). ?- Moderate oxygen desaturation was noted during this study (Min O2 = 82%). ?- Patient snored 13.3% during the sleep. ? ?DIAGNOSIS ?- Obstructive Sleep Apnea (G47.33) ? ?RECOMMENDATIONS ?- Avoid alcohol, sedatives and other CNS depressants that may worsen sleep apnea and disrupt normal sleep architecture. ?- Sleep hygiene should be reviewed to assess factors that may improve sleep quality. ?- Weight management and regular exercise should be initiated or continued. ?- Patient may benefit from in-lab study ? ?[Electronically signed] 11/18/2021 11:02 AM ? ?Fransico Him MD, ABSM ?Diplomate, Tax adviser of Sleep Medicine ? ? ?

## 2021-11-28 ENCOUNTER — Telehealth: Payer: Self-pay | Admitting: *Deleted

## 2021-11-28 DIAGNOSIS — G4733 Obstructive sleep apnea (adult) (pediatric): Secondary | ICD-10-CM

## 2021-11-28 NOTE — Telephone Encounter (Signed)
-----   Message from Lauralee Evener, [place] sent at 11/18/2021  1:26 PM EDT ----- ? ?----- Message ----- ?From: Sueanne Margarita, MD ?Sent: 11/18/2021  11:06 AM EDT ?To: Cv Div Sleep Studies ? ?Please let patient know that they have sleep apnea.  Recommend therapeutic CPAP titration for treatment of patient's sleep disordered breathing.  If unable to perform an in lab titration then initiate ResMed auto CPAP from 4 to 15cm H2O with heated humidity and mask of choice and overnight pulse ox on CPAP.    ? ?

## 2021-12-02 DIAGNOSIS — N907 Vulvar cyst: Secondary | ICD-10-CM | POA: Diagnosis not present

## 2021-12-02 DIAGNOSIS — Z853 Personal history of malignant neoplasm of breast: Secondary | ICD-10-CM | POA: Diagnosis not present

## 2021-12-02 DIAGNOSIS — N905 Atrophy of vulva: Secondary | ICD-10-CM | POA: Diagnosis not present

## 2021-12-02 DIAGNOSIS — N952 Postmenopausal atrophic vaginitis: Secondary | ICD-10-CM | POA: Diagnosis not present

## 2021-12-02 DIAGNOSIS — Z01411 Encounter for gynecological examination (general) (routine) with abnormal findings: Secondary | ICD-10-CM | POA: Diagnosis not present

## 2021-12-02 DIAGNOSIS — R140 Abdominal distension (gaseous): Secondary | ICD-10-CM | POA: Diagnosis not present

## 2021-12-07 ENCOUNTER — Telehealth: Payer: Self-pay | Admitting: *Deleted

## 2021-12-07 DIAGNOSIS — G4733 Obstructive sleep apnea (adult) (pediatric): Secondary | ICD-10-CM

## 2021-12-07 NOTE — Telephone Encounter (Signed)
-----   Message from Lauralee Evener, [place] sent at 11/18/2021  1:26 PM EDT ----- ? ?----- Message ----- ?From: Sueanne Margarita, MD ?Sent: 11/18/2021  11:06 AM EDT ?To: Cv Div Sleep Studies ? ?Please let patient know that they have sleep apnea.  Recommend therapeutic CPAP titration for treatment of patient's sleep disordered breathing.  If unable to perform an in lab titration then initiate ResMed auto CPAP from 4 to 15cm H2O with heated humidity and mask of choice and overnight pulse ox on CPAP.    ? ?

## 2021-12-07 NOTE — Telephone Encounter (Addendum)
The patient has been notified of the result and verbalized understanding.  All questions (if any) were answered.  ?Victoria Pacheco, [place] 12/07/2021 10:17 PM   ?Patient is not sure she wants to do the cpap titration. She is going to discuss her study with her husband and call our office back. ?

## 2021-12-15 NOTE — Telephone Encounter (Signed)
Patient will talk with Victoria Pacheco before making her final decision to test again. ? ?

## 2021-12-19 NOTE — Telephone Encounter (Signed)
Patient will talk with Malka So before making her final decision to test again. ?

## 2021-12-24 DIAGNOSIS — D251 Intramural leiomyoma of uterus: Secondary | ICD-10-CM | POA: Diagnosis not present

## 2021-12-24 DIAGNOSIS — R140 Abdominal distension (gaseous): Secondary | ICD-10-CM | POA: Diagnosis not present

## 2021-12-30 ENCOUNTER — Encounter (HOSPITAL_COMMUNITY): Payer: Self-pay | Admitting: Physician Assistant

## 2021-12-30 ENCOUNTER — Ambulatory Visit (HOSPITAL_COMMUNITY)
Admission: RE | Admit: 2021-12-30 | Discharge: 2021-12-30 | Disposition: A | Discharge status: Home / Self Care | Payer: Medicare Other | Source: Ambulatory Visit | Attending: Physician Assistant | Admitting: Physician Assistant

## 2021-12-30 VITALS — BP 116/78 | HR 65 | Ht 64.0 in | Wt 191.6 lb

## 2021-12-30 DIAGNOSIS — Z79899 Other long term (current) drug therapy: Secondary | ICD-10-CM | POA: Insufficient documentation

## 2021-12-30 DIAGNOSIS — G4733 Obstructive sleep apnea (adult) (pediatric): Secondary | ICD-10-CM | POA: Diagnosis not present

## 2021-12-30 DIAGNOSIS — R400 Somnolence: Secondary | ICD-10-CM | POA: Insufficient documentation

## 2021-12-30 DIAGNOSIS — Z7901 Long term (current) use of anticoagulants: Secondary | ICD-10-CM | POA: Insufficient documentation

## 2021-12-30 DIAGNOSIS — E785 Hyperlipidemia, unspecified: Secondary | ICD-10-CM | POA: Insufficient documentation

## 2021-12-30 DIAGNOSIS — J45909 Unspecified asthma, uncomplicated: Secondary | ICD-10-CM | POA: Insufficient documentation

## 2021-12-30 DIAGNOSIS — Z6832 Body mass index (BMI) 32.0-32.9, adult: Secondary | ICD-10-CM | POA: Diagnosis not present

## 2021-12-30 DIAGNOSIS — E039 Hypothyroidism, unspecified: Secondary | ICD-10-CM | POA: Insufficient documentation

## 2021-12-30 DIAGNOSIS — I480 Paroxysmal atrial fibrillation: Secondary | ICD-10-CM | POA: Diagnosis not present

## 2021-12-30 DIAGNOSIS — E669 Obesity, unspecified: Secondary | ICD-10-CM | POA: Insufficient documentation

## 2021-12-30 MED ORDER — ATENOLOL 50 MG PO TABS: 50.0000 mg | ORAL_TABLET | Freq: Every day | ORAL | 0 refills | Status: DC

## 2021-12-30 NOTE — Progress Notes (Addendum)
? ? ?Primary Care Physician: Reynold Bowen, MD ?Primary Electrophysiologist: Dr Caryl Comes ?Referring Physician: Dr Caryl Comes ? ? ?Victoria Pacheco is a 71 y.o. female with a history of asthma, HLD, hypothyroidism, atrial fibrillation who presents for follow up in the [facility]. Patient is on Eliquis for a CHADS2VASC score of 2. Patient started on flecainide 03/02/21 for recurrent episodes of afib. Patient reports that she has had 3 episodes of afib since then. One episode occurred in the setting of COVID infection and another after the death of of a close family member. Her heart rate is 140-150 when in afib. Patient denies alcohol use but does admit to snoring and daytime somnolence.  ? ?On follow up today, patient reports that since switching to atenolol her constipation has resolved. She has not had any episodes of afib noted on her smart watch. She has been diagnosed with moderate OSA but has not started on CPAP yet. No bleeding issues on anticoagulation.  ? ?Today, she denies symptoms of palpitations, chest pain, shortness of breath, orthopnea, PND, lower extremity edema, dizziness, presyncope, syncope, bleeding, or neurologic sequela. The patient is tolerating medications without difficulties and is otherwise without complaint today.  ? ? ?Atrial Fibrillation Risk Factors: ? ?she does have symptoms or diagnosis of sleep apnea. ?she is pending CPAP titration.  ?she does not have a history of rheumatic fever. ?she does not have a history of alcohol use. ?The patient does not have a history of early familial atrial fibrillation or other arrhythmias. ? ?she has a BMI of Body mass index is 32.89 kg/m?Marland KitchenMarland Kitchen ?Filed Weights  ? 12/30/21 1415  ?Weight: 86.9 kg  ? ? ? ?Family History  ?Problem Relation Age of Onset  ? Colon polyps Father 69  ? Hypertension Father   ? Alzheimer's disease Father   ?     Deceased-95  ? Breast cancer Mother 36  ?     diagnosed second time at 43; Deceased-61  ? Healthy Daughter    ? Alzheimer's disease Paternal Aunt   ? Throat cancer Paternal Uncle   ? Lung cancer Paternal Uncle   ? Prostate cancer Paternal Uncle   ? Lung cancer Maternal Uncle   ? Stroke Maternal Grandmother   ? Bladder Cancer Maternal Grandfather   ? Heart attack Paternal Grandmother   ? Colon cancer Neg Hx   ? ? ? ?Atrial Fibrillation Management history: ? ?Previous antiarrhythmic drugs: flecainide  ?Previous cardioversions: none ?Previous ablations: none ?CHADS2VASC score: 2 ?Anticoagulation history: Xarelto, Eliquis ? ? ?Past Medical History:  ?Diagnosis Date  ? Allergic rhinitis   ? Asthma   ? related to allergy to cockateil (bird)   ? Cancer [facility])   ? Breast- stage one , stereotactic core biopsy - 08/2017 & again in 09/2017  ? Diffuse cystic mastopathy   ? Family history of breast cancer   ? GERD (gastroesophageal reflux disease)   ? Headache   ? hx of cluster headache , better since post menopausal  ? History of kidney stones   ? passed spontaneously - fr. a dietary supplement   ? Hx of colonic polyps   ? Hypercholesteremia   ? Hypothyroidism 1989  ? thyroid scan done with radio iodine   ? Left knee injury   ? Multiple lipomas   ? Osteopenia   ? Pneumonia   ? not hosp. - treated with antibiotics ( several courses)   ? Unspecified menopausal and postmenopausal disorder   ? Vitamin D deficiency   ? ?  Past Surgical History:  ?Procedure Laterality Date  ? benign right breast biopsy  2002  ? BREAST LUMPECTOMY WITH RADIOACTIVE SEED AND SENTINEL LYMPH NODE BIOPSY Right 10/10/2017  ? Procedure: BREAST LUMPECTOMY WITH RADIOACTIVE SEED AND SENTINEL LYMPH NODE BIOPSY;  Surgeon: Rolm Bookbinder, MD;  Location: [place];  Service: General;  Laterality: Right;  ? TONSILLECTOMY AND ADENOIDECTOMY    ? at age 67  ? WISDOM TOOTH EXTRACTION    ? as a teen  ? ? ?Current Outpatient Medications  ?Medication Sig Dispense Refill  ? acetaminophen (TYLENOL) 650 MG CR tablet Take 650 mg by mouth daily as needed for pain.    ? anastrozole (ARIMIDEX)  1 MG tablet Take 1 mg by mouth daily.    ? calcium carbonate (TUMS - DOSED IN MG ELEMENTAL CALCIUM) 500 MG chewable tablet Chew 1 tablet by mouth daily as needed for indigestion or heartburn.    ? Cholecalciferol (VITAMIN D-3) 1000 UNITS CAPS Take 1,000 Units by mouth daily.     ? diphenhydrAMINE (BENADRYL) 25 MG tablet Take 25 mg by mouth every 6 (six) hours as needed for itching or allergies.    ? ELIQUIS 5 MG TABS tablet TAKE 1 TABLET BY MOUTH TWICE A DAY 60 tablet 5  ? famotidine (PEPCID) 20 MG tablet Take 10 mg by mouth 2 (two) times daily.     ? flecainide (TAMBOCOR) 50 MG tablet Take 1 tablet (50 mg total) by mouth 2 (two) times daily. 180 tablet 3  ? fluticasone (FLONASE) 50 MCG/ACT nasal spray Place 1 spray into both nostrils daily as needed for allergies or rhinitis.    ? furosemide (LASIX) 20 MG tablet TAKE 1 TABLET BY MOUTH AS NEEDED 20 tablet 3  ? Homeopathic Products (ARNICARE ARNICA) CREA Apply 1 application topically daily as needed (bruising).    ? levothyroxine (SYNTHROID, LEVOTHROID) 100 MCG tablet TAKE 1 TABLET BY MOUTH EVERY DAY 30 tablet 0  ? loratadine (CLARITIN) 10 MG tablet Take 10 mg by mouth daily as needed for allergies.    ? Magnesium Oxide 400 MG CAPS Take 1 capsule (400 mg total) by mouth daily. 90 capsule 0  ? Multiple Vitamin (MULTI-VITAMIN) tablet Take 1 tablet by mouth as needed.    ? Probiotic Product (ALIGN PO) Take 1 tablet by mouth daily.    ? rosuvastatin (CRESTOR) 5 MG tablet Take 5 mg by mouth 2 (two) times a week.    ? sodium chloride (OCEAN) 0.65 % SOLN nasal spray Place 1 spray into both nostrils as needed for congestion.    ? atenolol (TENORMIN) 50 MG tablet Take 1 tablet (50 mg total) by mouth daily. 90 tablet 0  ? ?No current facility-administered medications for this encounter.  ? ? ?Allergies  ?Allergen Reactions  ? Bee Venom Anaphylaxis  ? Coconut Fatty Acids   ?  Irritation of mouth,. Minimal swelling in tongue & throat  ? Lipitor [Atorvastatin]   ?  Cramps in  hands   ? Omeprazole Nausea And Vomiting  ?  Can take for only 2 days. 3rd day will have severe nausea and vomiting  ? Pantoprazole   ?  Can take for only 2 days. 3rd day will have severe nausea and vomiting  ? Sulfamethoxazole-Trimethoprim Hives and Itching  ? Adhesive [Tape] Rash  ? Bisoprolol Other (See Comments)  ?  Constipation   ? ? ?Social History  ? ?Socioeconomic History  ? Marital status: Married  ?  Spouse name: james x 40 +  yrs  ? Number of children: 1  ? Years of education: Not on file  ? Highest education level: Not on file  ?Occupational History  ? Not on file  ?Tobacco Use  ? Smoking status: Never  ? Smokeless tobacco: Never  ? Tobacco comments:  ?  Never smoke 12/30/21  ?Vaping Use  ? Vaping Use: Never used  ?Substance and Sexual Activity  ? Alcohol use: Yes  ?  Comment: social use  ? Drug use: No  ? Sexual activity: Not on file  ?Other Topics Concern  ? Not on file  ?Social History Narrative  ? Not on file  ? ?Social Determinants of Health  ? ?Financial Resource Strain: Not on file  ?Food Insecurity: Not on file  ?Transportation Needs: Not on file  ?Physical Activity: Not on file  ?Stress: Not on file  ?Social Connections: Not on file  ?Intimate Partner Violence: Not on file  ? ? ? ?ROS- All systems are reviewed and negative except as per the HPI above. ? ?Physical Exam: ?Vitals:  ? 12/30/21 1415  ?BP: 116/78  ?Pulse: 65  ?Weight: 86.9 kg  ?Height: '5\' 4"'$  (1.626 m)  ? ? ?GEN- The patient is a well appearing obese female, alert and oriented x 3 today.   ?HEENT-head normocephalic, atraumatic, sclera clear, conjunctiva pink, hearing intact, trachea midline. ?Lungs- Clear to ausculation bilaterally, normal work of breathing ?Heart- Regular rate and rhythm, no murmurs, rubs or gallops  ?GI- soft, NT, ND, + BS ?Extremities- no clubbing, cyanosis, or edema ?MS- no significant deformity or atrophy ?Skin- no rash or lesion ?Psych- euthymic mood, full affect ?Neuro- strength and sensation are intact ? ? ?Wt  Readings from Last 3 Encounters:  ?12/30/21 86.9 kg  ?11/16/21 82.6 kg  ?10/06/21 83.7 kg  ? ? ?EKG today demonstrates  ?SR, NST ?Vent. rate 65 BPM ?PR interval 184 ms ?QRS duration 78 ms ?QT/QTcB 386/401

## 2021-12-30 NOTE — Addendum Note (Signed)
Encounter addended by: Oliver Barre, PA on: 12/30/2021 2:50 PM ? Actions taken: Clinical Note Signed

## 2022-01-06 ENCOUNTER — Other Ambulatory Visit: Payer: Self-pay | Admitting: Internal Medicine

## 2022-01-06 DIAGNOSIS — I480 Paroxysmal atrial fibrillation: Secondary | ICD-10-CM

## 2022-01-06 NOTE — Telephone Encounter (Signed)
Eliquis '5mg'$  refill request received. Patient is 71 years old, weight-86.9kg, Crea-1.13 on 07/14/2021 via [place], Diagnosis-Afib, and last seen by Adline Peals on 12/30/2021. Dose is appropriate based on dosing criteria. Will send in refill to requested pharmacy.   ?

## 2022-01-19 DIAGNOSIS — I4891 Unspecified atrial fibrillation: Secondary | ICD-10-CM | POA: Diagnosis not present

## 2022-01-19 DIAGNOSIS — Z170 Estrogen receptor positive status [ER+]: Secondary | ICD-10-CM | POA: Diagnosis not present

## 2022-01-19 DIAGNOSIS — Z780 Asymptomatic menopausal state: Secondary | ICD-10-CM | POA: Diagnosis not present

## 2022-01-19 DIAGNOSIS — Z923 Personal history of irradiation: Secondary | ICD-10-CM | POA: Diagnosis not present

## 2022-01-19 DIAGNOSIS — Z7901 Long term (current) use of anticoagulants: Secondary | ICD-10-CM | POA: Diagnosis not present

## 2022-01-19 DIAGNOSIS — C50511 Malignant neoplasm of lower-outer quadrant of right female breast: Secondary | ICD-10-CM | POA: Diagnosis not present

## 2022-01-19 DIAGNOSIS — Z79811 Long term (current) use of aromatase inhibitors: Secondary | ICD-10-CM | POA: Diagnosis not present

## 2022-01-28 DIAGNOSIS — I4891 Unspecified atrial fibrillation: Secondary | ICD-10-CM | POA: Diagnosis not present

## 2022-01-28 DIAGNOSIS — E559 Vitamin D deficiency, unspecified: Secondary | ICD-10-CM | POA: Diagnosis not present

## 2022-01-28 DIAGNOSIS — R21 Rash and other nonspecific skin eruption: Secondary | ICD-10-CM | POA: Diagnosis not present

## 2022-01-28 DIAGNOSIS — M8580 Other specified disorders of bone density and structure, unspecified site: Secondary | ICD-10-CM | POA: Diagnosis not present

## 2022-01-28 DIAGNOSIS — G4730 Sleep apnea, unspecified: Secondary | ICD-10-CM | POA: Diagnosis not present

## 2022-01-28 DIAGNOSIS — E669 Obesity, unspecified: Secondary | ICD-10-CM | POA: Diagnosis not present

## 2022-01-28 DIAGNOSIS — N1831 Chronic kidney disease, stage 3a: Secondary | ICD-10-CM | POA: Diagnosis not present

## 2022-01-28 DIAGNOSIS — J45909 Unspecified asthma, uncomplicated: Secondary | ICD-10-CM | POA: Diagnosis not present

## 2022-01-28 DIAGNOSIS — Z853 Personal history of malignant neoplasm of breast: Secondary | ICD-10-CM | POA: Diagnosis not present

## 2022-01-28 DIAGNOSIS — E039 Hypothyroidism, unspecified: Secondary | ICD-10-CM | POA: Diagnosis not present

## 2022-01-28 DIAGNOSIS — E785 Hyperlipidemia, unspecified: Secondary | ICD-10-CM | POA: Diagnosis not present

## 2022-02-16 ENCOUNTER — Other Ambulatory Visit: Payer: Self-pay | Admitting: Internal Medicine

## 2022-02-17 DIAGNOSIS — L718 Other rosacea: Secondary | ICD-10-CM | POA: Diagnosis not present

## 2022-02-17 DIAGNOSIS — L723 Sebaceous cyst: Secondary | ICD-10-CM | POA: Diagnosis not present

## 2022-06-19 DIAGNOSIS — Z23 Encounter for immunization: Secondary | ICD-10-CM | POA: Diagnosis not present

## 2022-07-01 ENCOUNTER — Encounter: Payer: Self-pay | Admitting: Internal Medicine

## 2022-07-01 ENCOUNTER — Ambulatory Visit: Payer: Medicare Other | Attending: Internal Medicine | Admitting: Internal Medicine

## 2022-07-01 VITALS — BP 122/76 | Ht 64.0 in | Wt 194.2 lb

## 2022-07-01 DIAGNOSIS — I5030 Unspecified diastolic (congestive) heart failure: Secondary | ICD-10-CM | POA: Diagnosis not present

## 2022-07-01 DIAGNOSIS — I480 Paroxysmal atrial fibrillation: Secondary | ICD-10-CM | POA: Diagnosis not present

## 2022-07-01 NOTE — Progress Notes (Signed)
Patient Care Team: Reynold Bowen, MD as PCP - General (Endocrinology) Roque Cash., MD as Consulting Physician (Obstetrics and Gynecology)   HPI  Victoria Pacheco is a 71 y.o. female seen in follow up of paroxysmal afib in the context of breast CA. anticoagulation with apixaban and rhythm control with flecainide.  No bleeding      The patient denies chest pain, shortness of breath, nocturnal dyspnea, orthopnea or peripheral edema.  There have been no  lightheadedness or syncope.  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  Some recurrent episodes of atrial fibrillation.  Often waking her from sleep.    DATE TEST EF    5/21 Echo   65-70 % LA  Normal    7/22 Exercise Tolerance Test     BP had a hypertensive response, no T wave inversion nor ST segment deviation.    DATE PR interval QRSduration Dose  6/22  168 68 0  1/23 178 80 50  10/23 166 70 50     DATE Cr K Hgb  10/16  1.17 3.9   1/19   12.7  11/22 1.13 4.3 32.3     Thromboembolic risk factors ( age -44, Gender-1) for a CHADSVASc Score of >=2  Records and Results Reviewed   Past Medical History:  Diagnosis Date   Allergic rhinitis    Asthma    related to allergy to cockateil (bird)    Cancer [facility])    Breast- stage one , stereotactic core biopsy - 08/2017 & again in 09/2017   Diffuse cystic mastopathy    Family history of breast cancer    GERD (gastroesophageal reflux disease)    Headache    hx of cluster headache , better since post menopausal   History of kidney stones    passed spontaneously - fr. a dietary supplement    Hx of colonic polyps    Hypercholesteremia    Hypothyroidism 1989   thyroid scan done with radio iodine    Left knee injury    Multiple lipomas    Osteopenia    Pneumonia    not hosp. - treated with antibiotics ( several courses)    Unspecified menopausal and postmenopausal disorder    Vitamin D deficiency     Past Surgical History:  Procedure  Laterality Date   benign right breast biopsy  2002   BREAST LUMPECTOMY WITH RADIOACTIVE SEED AND SENTINEL LYMPH NODE BIOPSY Right 10/10/2017   Procedure: BREAST LUMPECTOMY WITH RADIOACTIVE SEED AND SENTINEL LYMPH NODE BIOPSY;  Surgeon: Rolm Bookbinder, MD;  Location: [place];  Service: General;  Laterality: Right;   TONSILLECTOMY AND ADENOIDECTOMY     at age 58   WISDOM TOOTH EXTRACTION     as a teen    Current Meds  Medication Sig   acetaminophen (TYLENOL) 650 MG CR tablet Take 650 mg by mouth daily as needed for pain.   anastrozole (ARIMIDEX) 1 MG tablet Take 1 mg by mouth daily.   atenolol (TENORMIN) 50 MG tablet Take 1 tablet (50 mg total) by mouth daily.   calcium carbonate (TUMS - DOSED IN MG ELEMENTAL CALCIUM) 500 MG chewable tablet Chew 1 tablet by mouth daily as needed for indigestion or heartburn.   Cholecalciferol (VITAMIN D-3) 1000 UNITS CAPS Take 1,000 Units by mouth daily.    diphenhydrAMINE (BENADRYL) 25 MG tablet Take 25 mg by mouth every 6 (six) hours as needed for itching or allergies.  ELIQUIS 5 MG TABS tablet TAKE 1 TABLET BY MOUTH TWICE A DAY   famotidine (PEPCID) 20 MG tablet Take 10 mg by mouth 2 (two) times daily.    flecainide (TAMBOCOR) 50 MG tablet TAKE 1 TABLET BY MOUTH TWICE A DAY   fluticasone (FLONASE) 50 MCG/ACT nasal spray Place 1 spray into both nostrils daily as needed for allergies or rhinitis.   furosemide (LASIX) 20 MG tablet TAKE 1 TABLET BY MOUTH AS NEEDED   Homeopathic Products (ARNICARE ARNICA) CREA Apply 1 application topically daily as needed (bruising).   levothyroxine (SYNTHROID, LEVOTHROID) 100 MCG tablet TAKE 1 TABLET BY MOUTH EVERY DAY   loratadine (CLARITIN) 10 MG tablet Take 10 mg by mouth daily as needed for allergies.   Magnesium Oxide 400 MG CAPS Take 1 capsule (400 mg total) by mouth daily.   Multiple Vitamin (MULTI-VITAMIN) tablet Take 1 tablet by mouth as needed.   Probiotic Product (ALIGN PO) Take 1 tablet by mouth daily.    rosuvastatin (CRESTOR) 5 MG tablet Take 5 mg by mouth 2 (two) times a week.   sodium chloride (OCEAN) 0.65 % SOLN nasal spray Place 1 spray into both nostrils as needed for congestion.    Allergies  Allergen Reactions   Bee Venom Anaphylaxis   Coconut Fatty Acids     Irritation of mouth,. Minimal swelling in tongue & throat   Lipitor [Atorvastatin]     Cramps in hands    Omeprazole Nausea And Vomiting    Can take for only 2 days. 3rd day will have severe nausea and vomiting   Pantoprazole     Can take for only 2 days. 3rd day will have severe nausea and vomiting   Sulfamethoxazole-Trimethoprim Hives and Itching   Adhesive [Tape] Rash   Bisoprolol Other (See Comments)    Constipation       Review of Systems negative except from HPI and PMH  Physical Exam BP 122/76   Ht '5\' 4"'$  (1.626 m)   Wt 194 lb 3.2 oz (88.1 kg)   SpO2 97%   BMI 33.33 kg/m  Well developed and nourished in no acute distress HENT normal Neck supple with JVP-  flat   Clear Regular rate and rhythm, no murmurs or gallops Abd-soft with active BS No Clubbing cyanosis edema Skin-warm and dry A & Oriented  Grossly normal sensory and motor function  ECG sinus  Normal intervals     ECG: Sinus at 63 Intervals 18/08/36  03/02/21 ECG: sinus at 63 Intervals 14/07/35 Nonspecific T wave changes  CrCl cannot be calculated (Patient's most recent lab result is older than the maximum 21 days allowed.).   Assessment and  Plan  Atrial fibrillation RVR paroxysmal   Intraatrial lipomatous septum   HFpEF  Breast Ca   Multiple drug side effects\  Sleep Apnea   Continues with episodes of atrial fibrillation despite the flecainide.  We discussed a series of options including catheter ablation, dofetilide which would obviate the need for AV nodal blockers or continuing her current course.  For now, she would like to do the latter although we will change the beta-blocker to try to see if we can find something  that she tolerates better.  There is been some bradycardia so we will try pindolol as well as nebivolol.  She will let us know.  Sleep apnea--moderate  dont remember her treatment status-- reached out to ask her.    She is euvolemic.  She will continue her furosemide as needed.

## 2022-07-01 NOTE — Patient Instructions (Addendum)
Medication Instructions:  Your physician has recommended you make the following change in your medication: as discussed by Dr Caryl Comes.  Hard copy prescriptions given for   Nebivolol 2.'5mg'$  - 1 tablet by mouth daily  Pindolol '5mg'$  - 1/2 tablet by mouth daily  Pt will stop Atenolol while trialing these medications.   *If you need a refill on your cardiac medications before your next appointment, please call your pharmacy*   Lab Work: None ordered.  If you have labs (blood work) drawn today and your tests are completely normal, you will receive your results only by: [place] (if you have MyChart) OR A paper copy in the mail If you have any lab test that is abnormal or we need to change your treatment, we will call you to review the results.   Testing/Procedures: None ordered.    Follow-Up: At [facility], you and your health needs are our priority.  As part of our continuing mission to provide you with exceptional heart care, we have created designated Provider Care Teams.  These Care Teams include your primary Cardiologist (physician) and Advanced Practice Providers (APPs -  Physician Assistants and Nurse Practitioners) who all work together to provide you with the care you need, when you need it.  We recommend signing up for the patient portal called "MyChart".  Sign up information is provided on this After Visit Summary.  MyChart is used to connect with patients for Virtual Visits (Telemedicine).  Patients are able to view lab/test results, encounter notes, upcoming appointments, etc.  Non-urgent messages can be sent to your provider as well.   To learn more about what you can do with MyChart, go to [email or web address].    Your next appointment:   12 months with Dr Caryl Comes  Important Information About Sugar

## 2022-07-13 ENCOUNTER — Other Ambulatory Visit: Payer: Self-pay | Admitting: Internal Medicine

## 2022-07-13 DIAGNOSIS — I480 Paroxysmal atrial fibrillation: Secondary | ICD-10-CM

## 2022-07-13 NOTE — Telephone Encounter (Signed)
Prescription refill request for Eliquis received. Indication:afib Last office visit:10/23 Scr:1.2 Age: 71 Weight:88.1 kg  Prescription refilled

## 2022-08-02 DIAGNOSIS — G4730 Sleep apnea, unspecified: Secondary | ICD-10-CM | POA: Diagnosis not present

## 2022-08-02 DIAGNOSIS — I4891 Unspecified atrial fibrillation: Secondary | ICD-10-CM | POA: Diagnosis not present

## 2022-08-02 DIAGNOSIS — K219 Gastro-esophageal reflux disease without esophagitis: Secondary | ICD-10-CM | POA: Diagnosis not present

## 2022-08-02 DIAGNOSIS — E559 Vitamin D deficiency, unspecified: Secondary | ICD-10-CM | POA: Diagnosis not present

## 2022-08-02 DIAGNOSIS — Z853 Personal history of malignant neoplasm of breast: Secondary | ICD-10-CM | POA: Diagnosis not present

## 2022-08-02 DIAGNOSIS — Z23 Encounter for immunization: Secondary | ICD-10-CM | POA: Diagnosis not present

## 2022-08-02 DIAGNOSIS — M8580 Other specified disorders of bone density and structure, unspecified site: Secondary | ICD-10-CM | POA: Diagnosis not present

## 2022-08-02 DIAGNOSIS — J45909 Unspecified asthma, uncomplicated: Secondary | ICD-10-CM | POA: Diagnosis not present

## 2022-08-02 DIAGNOSIS — E669 Obesity, unspecified: Secondary | ICD-10-CM | POA: Diagnosis not present

## 2022-08-02 DIAGNOSIS — E039 Hypothyroidism, unspecified: Secondary | ICD-10-CM | POA: Diagnosis not present

## 2022-08-02 DIAGNOSIS — N1831 Chronic kidney disease, stage 3a: Secondary | ICD-10-CM | POA: Diagnosis not present

## 2022-08-02 DIAGNOSIS — E785 Hyperlipidemia, unspecified: Secondary | ICD-10-CM | POA: Diagnosis not present

## 2022-08-05 DIAGNOSIS — C50511 Malignant neoplasm of lower-outer quadrant of right female breast: Secondary | ICD-10-CM | POA: Diagnosis not present

## 2022-08-05 DIAGNOSIS — Z170 Estrogen receptor positive status [ER+]: Secondary | ICD-10-CM | POA: Diagnosis not present

## 2022-08-10 ENCOUNTER — Other Ambulatory Visit: Payer: Self-pay | Admitting: *Deleted

## 2022-08-10 MED ORDER — PINDOLOL 5 MG PO TABS: 2.5000 mg | ORAL_TABLET | Freq: Every day | ORAL | 3 refills | Status: DC

## 2022-08-17 DIAGNOSIS — Z23 Encounter for immunization: Secondary | ICD-10-CM | POA: Diagnosis not present

## 2022-09-02 DIAGNOSIS — D1801 Hemangioma of skin and subcutaneous tissue: Secondary | ICD-10-CM | POA: Diagnosis not present

## 2022-09-02 DIAGNOSIS — L603 Nail dystrophy: Secondary | ICD-10-CM | POA: Diagnosis not present

## 2022-09-02 DIAGNOSIS — L718 Other rosacea: Secondary | ICD-10-CM | POA: Diagnosis not present

## 2022-09-02 DIAGNOSIS — D1721 Benign lipomatous neoplasm of skin and subcutaneous tissue of right arm: Secondary | ICD-10-CM | POA: Diagnosis not present

## 2022-09-02 DIAGNOSIS — D2372 Other benign neoplasm of skin of left lower limb, including hip: Secondary | ICD-10-CM | POA: Diagnosis not present

## 2022-09-02 DIAGNOSIS — L821 Other seborrheic keratosis: Secondary | ICD-10-CM | POA: Diagnosis not present

## 2022-10-18 DIAGNOSIS — M542 Cervicalgia: Secondary | ICD-10-CM | POA: Diagnosis not present

## 2022-11-08 DIAGNOSIS — M546 Pain in thoracic spine: Secondary | ICD-10-CM | POA: Diagnosis not present

## 2022-11-08 DIAGNOSIS — S29012D Strain of muscle and tendon of back wall of thorax, subsequent encounter: Secondary | ICD-10-CM | POA: Diagnosis not present

## 2022-11-22 DIAGNOSIS — M546 Pain in thoracic spine: Secondary | ICD-10-CM | POA: Diagnosis not present

## 2022-11-22 DIAGNOSIS — S29012D Strain of muscle and tendon of back wall of thorax, subsequent encounter: Secondary | ICD-10-CM | POA: Diagnosis not present

## 2022-12-02 DIAGNOSIS — M546 Pain in thoracic spine: Secondary | ICD-10-CM | POA: Diagnosis not present

## 2022-12-02 DIAGNOSIS — S29012D Strain of muscle and tendon of back wall of thorax, subsequent encounter: Secondary | ICD-10-CM | POA: Diagnosis not present

## 2022-12-06 DIAGNOSIS — S29012D Strain of muscle and tendon of back wall of thorax, subsequent encounter: Secondary | ICD-10-CM | POA: Diagnosis not present

## 2022-12-06 DIAGNOSIS — M546 Pain in thoracic spine: Secondary | ICD-10-CM | POA: Diagnosis not present

## 2022-12-16 DIAGNOSIS — S29012D Strain of muscle and tendon of back wall of thorax, subsequent encounter: Secondary | ICD-10-CM | POA: Diagnosis not present

## 2022-12-16 DIAGNOSIS — M546 Pain in thoracic spine: Secondary | ICD-10-CM | POA: Diagnosis not present

## 2022-12-22 DIAGNOSIS — M546 Pain in thoracic spine: Secondary | ICD-10-CM | POA: Diagnosis not present

## 2022-12-22 DIAGNOSIS — S29012D Strain of muscle and tendon of back wall of thorax, subsequent encounter: Secondary | ICD-10-CM | POA: Diagnosis not present

## 2023-01-26 DIAGNOSIS — E785 Hyperlipidemia, unspecified: Secondary | ICD-10-CM | POA: Diagnosis not present

## 2023-01-26 DIAGNOSIS — Z0000 Encounter for general adult medical examination without abnormal findings: Secondary | ICD-10-CM | POA: Diagnosis not present

## 2023-01-26 DIAGNOSIS — K219 Gastro-esophageal reflux disease without esophagitis: Secondary | ICD-10-CM | POA: Diagnosis not present

## 2023-01-26 DIAGNOSIS — E559 Vitamin D deficiency, unspecified: Secondary | ICD-10-CM | POA: Diagnosis not present

## 2023-01-26 DIAGNOSIS — E039 Hypothyroidism, unspecified: Secondary | ICD-10-CM | POA: Diagnosis not present

## 2023-01-26 DIAGNOSIS — M8580 Other specified disorders of bone density and structure, unspecified site: Secondary | ICD-10-CM | POA: Diagnosis not present

## 2023-02-02 ENCOUNTER — Other Ambulatory Visit: Payer: Self-pay | Admitting: Internal Medicine

## 2023-02-02 DIAGNOSIS — E039 Hypothyroidism, unspecified: Secondary | ICD-10-CM | POA: Diagnosis not present

## 2023-02-02 DIAGNOSIS — I480 Paroxysmal atrial fibrillation: Secondary | ICD-10-CM

## 2023-02-02 DIAGNOSIS — Z1339 Encounter for screening examination for other mental health and behavioral disorders: Secondary | ICD-10-CM | POA: Diagnosis not present

## 2023-02-02 DIAGNOSIS — E669 Obesity, unspecified: Secondary | ICD-10-CM | POA: Diagnosis not present

## 2023-02-02 DIAGNOSIS — N1831 Chronic kidney disease, stage 3a: Secondary | ICD-10-CM | POA: Diagnosis not present

## 2023-02-02 DIAGNOSIS — G4730 Sleep apnea, unspecified: Secondary | ICD-10-CM | POA: Diagnosis not present

## 2023-02-02 DIAGNOSIS — E785 Hyperlipidemia, unspecified: Secondary | ICD-10-CM | POA: Diagnosis not present

## 2023-02-02 DIAGNOSIS — K219 Gastro-esophageal reflux disease without esophagitis: Secondary | ICD-10-CM | POA: Diagnosis not present

## 2023-02-02 DIAGNOSIS — R82998 Other abnormal findings in urine: Secondary | ICD-10-CM | POA: Diagnosis not present

## 2023-02-02 DIAGNOSIS — Z1331 Encounter for screening for depression: Secondary | ICD-10-CM | POA: Diagnosis not present

## 2023-02-02 DIAGNOSIS — Z853 Personal history of malignant neoplasm of breast: Secondary | ICD-10-CM | POA: Diagnosis not present

## 2023-02-02 DIAGNOSIS — K635 Polyp of colon: Secondary | ICD-10-CM | POA: Diagnosis not present

## 2023-02-02 DIAGNOSIS — I4891 Unspecified atrial fibrillation: Secondary | ICD-10-CM | POA: Diagnosis not present

## 2023-02-02 DIAGNOSIS — Z0000 Encounter for general adult medical examination without abnormal findings: Secondary | ICD-10-CM | POA: Diagnosis not present

## 2023-02-02 NOTE — Telephone Encounter (Signed)
Prescription refill request for Eliquis received. Indication: Afib  Last office visit: 07/01/22 Victoria Pacheco)  Scr: 1.31 (08/05/22)  Age: 72 Weight: 88.1kg  Appropriate dose. Refill sent.

## 2023-02-10 DIAGNOSIS — Z01419 Encounter for gynecological examination (general) (routine) without abnormal findings: Secondary | ICD-10-CM | POA: Diagnosis not present

## 2023-02-10 DIAGNOSIS — Z853 Personal history of malignant neoplasm of breast: Secondary | ICD-10-CM | POA: Diagnosis not present

## 2023-02-15 DIAGNOSIS — R928 Other abnormal and inconclusive findings on diagnostic imaging of breast: Secondary | ICD-10-CM | POA: Diagnosis not present

## 2023-02-15 DIAGNOSIS — C50511 Malignant neoplasm of lower-outer quadrant of right female breast: Secondary | ICD-10-CM | POA: Diagnosis not present

## 2023-02-15 DIAGNOSIS — Z853 Personal history of malignant neoplasm of breast: Secondary | ICD-10-CM | POA: Diagnosis not present

## 2023-02-15 DIAGNOSIS — N641 Fat necrosis of breast: Secondary | ICD-10-CM | POA: Diagnosis not present

## 2023-02-15 DIAGNOSIS — R92323 Mammographic fibroglandular density, bilateral breasts: Secondary | ICD-10-CM | POA: Diagnosis not present

## 2023-02-19 ENCOUNTER — Other Ambulatory Visit: Payer: Self-pay | Admitting: Internal Medicine

## 2023-04-01 DIAGNOSIS — Z170 Estrogen receptor positive status [ER+]: Secondary | ICD-10-CM | POA: Diagnosis not present

## 2023-04-01 DIAGNOSIS — C50511 Malignant neoplasm of lower-outer quadrant of right female breast: Secondary | ICD-10-CM | POA: Diagnosis not present

## 2023-06-18 DIAGNOSIS — Z23 Encounter for immunization: Secondary | ICD-10-CM | POA: Diagnosis not present

## 2023-07-01 DIAGNOSIS — M25562 Pain in left knee: Secondary | ICD-10-CM | POA: Diagnosis not present

## 2023-07-18 ENCOUNTER — Ambulatory Visit: Payer: Medicare Other | Attending: Internal Medicine | Admitting: Internal Medicine

## 2023-07-18 ENCOUNTER — Encounter: Payer: Self-pay | Admitting: Internal Medicine

## 2023-07-18 VITALS — BP 124/86 | HR 67 | Ht 64.0 in | Wt 196.6 lb

## 2023-07-18 DIAGNOSIS — I5032 Chronic diastolic (congestive) heart failure: Secondary | ICD-10-CM | POA: Diagnosis not present

## 2023-07-18 DIAGNOSIS — I480 Paroxysmal atrial fibrillation: Secondary | ICD-10-CM | POA: Insufficient documentation

## 2023-07-18 MED ORDER — PINDOLOL 5 MG PO TABS: 2.5000 mg | ORAL_TABLET | Freq: Every day | ORAL | 3 refills | Status: DC

## 2023-07-18 NOTE — Patient Instructions (Signed)
Medication Instructions:  Your physician recommends that you continue on your current medications as directed. Please refer to the Current Medication list given to you today.  *If you need a refill on your cardiac medications before your next appointment, please call your pharmacy*   Lab Work: None ordered.  If you have labs (blood work) drawn today and your tests are completely normal, you will receive your results only by: MyChart Message (if you have MyChart) OR A paper copy in the mail If you have any lab test that is abnormal or we need to change your treatment, we will call you to review the results.   Testing/Procedures: None ordered.    Follow-Up: At [facility], you and your health needs are our priority.  As part of our continuing mission to provide you with exceptional heart care, we have created designated Provider Care Teams.  These Care Teams include your primary Cardiologist (physician) and Advanced Practice Providers (APPs -  Physician Assistants and Nurse Practitioners) who all work together to provide you with the care you need, when you need it.  We recommend signing up for the patient portal called "MyChart".  Sign up information is provided on this After Visit Summary.  MyChart is used to connect with patients for Virtual Visits (Telemedicine).  Patients are able to view lab/test results, encounter notes, upcoming appointments, etc.  Non-urgent messages can be sent to your provider as well.   To learn more about what you can do with MyChart, go to [email or web address].    Your next appointment:   3 months with Dr Klein 

## 2023-07-18 NOTE — Progress Notes (Signed)
2      Patient Care Team: Adrian Prince, MD as PCP - General (Endocrinology) Ola Spurr., MD as Consulting Physician (Obstetrics and Gynecology)   HPI  Victoria Pacheco is a 72 y.o. female seen in follow up of paroxysmal afib in the context of breast CA. anticoagulation with apixaban and rhythm control with flecainide.  no  bleeding  Because of ongoing complaints of fatigue, she discussed with Dr. Ardyth Harps and up titration of her levothyroxine undertaken a few months ago from 100--112 mcg.  With this is occurred worsening fatigue but also worsening frequency of atrial fibrillation.  she has also gained significant weight despite walking albeit leisurely 5000 steps a day negative sleep study within the last year or so    DATE TEST EF    5/21 Echo   65-70 % LA  Normal    7/22 Exercise Tolerance Test     BP had a hypertensive response, no T wave inversion nor ST segment deviation.    DATE PR interval QRSduration Dose  6/22  168 68 0  1/23 178 80 50  10/23 166 70 50  11/24 170 10 50     DATE Cr K Hgb  10/16  1.17 3.9   1/19   12.7  }11/22 1.13 4.3 12.7  5/24 1.12       Thromboembolic risk factors ( age -41, Gender-1) for a CHADSVASc Score of >=2  Records and Results Reviewed   Past Medical History:  Diagnosis Date   Allergic rhinitis    Asthma    related to allergy to cockateil (bird)    Cancer [facility])    Breast- stage one , stereotactic core biopsy - 08/2017 & again in 09/2017   Diffuse cystic mastopathy    Family history of breast cancer    GERD (gastroesophageal reflux disease)    Headache    hx of cluster headache , better since post menopausal   History of kidney stones    passed spontaneously - fr. a dietary supplement    Hx of colonic polyps    Hypercholesteremia    Hypothyroidism 1989   thyroid scan done with radio iodine    Left knee injury    Multiple lipomas    Osteopenia    Pneumonia    not hosp. - treated with antibiotics ( several courses)     Unspecified menopausal and postmenopausal disorder    Vitamin D deficiency     Past Surgical History:  Procedure Laterality Date   benign right breast biopsy  2002   BREAST LUMPECTOMY WITH RADIOACTIVE SEED AND SENTINEL LYMPH NODE BIOPSY Right 10/10/2017   Procedure: BREAST LUMPECTOMY WITH RADIOACTIVE SEED AND SENTINEL LYMPH NODE BIOPSY;  Surgeon: Emelia Loron, MD;  Location: [facility] OR;  Service: General;  Laterality: Right;   TONSILLECTOMY AND ADENOIDECTOMY     at age 41   WISDOM TOOTH EXTRACTION     as a teen    Current Meds  Medication Sig   acetaminophen (TYLENOL) 650 MG CR tablet Take 650 mg by mouth daily as needed for pain.   anastrozole (ARIMIDEX) 1 MG tablet Take 1 mg by mouth daily.   calcium carbonate (TUMS - DOSED IN MG ELEMENTAL CALCIUM) 500 MG chewable tablet Chew 1 tablet by mouth daily as needed for indigestion or heartburn.   Cholecalciferol (VITAMIN D-3) 1000 UNITS CAPS Take 1,000 Units by mouth daily.    diphenhydrAMINE (BENADRYL) 25 MG tablet Take 25 mg by mouth every 6 (six) hours  as needed for itching or allergies.   ELIQUIS 5 MG TABS tablet TAKE 1 TABLET BY MOUTH TWICE A DAY   famotidine (PEPCID) 20 MG tablet Take 10 mg by mouth 2 (two) times daily.    flecainide (TAMBOCOR) 50 MG tablet TAKE 1 TABLET BY MOUTH TWICE A DAY   fluticasone (FLONASE) 50 MCG/ACT nasal spray Place 1 spray into both nostrils daily as needed for allergies or rhinitis.   furosemide (LASIX) 20 MG tablet TAKE 1 TABLET BY MOUTH AS NEEDED   Homeopathic Products (ARNICARE ARNICA) CREA Apply 1 application topically daily as needed (bruising).   levothyroxine (SYNTHROID) 112 MCG tablet Take 112 mcg by mouth daily.   loratadine (CLARITIN) 10 MG tablet Take 10 mg by mouth daily as needed for allergies.   Magnesium Oxide 400 MG CAPS Take 1 capsule (400 mg total) by mouth daily.   Multiple Vitamin (MULTI-VITAMIN) tablet Take 1 tablet by mouth as needed.   pindolol (VISKEN) 5 MG tablet Take 0.5  tablets (2.5 mg total) by mouth daily.   Probiotic Product (ALIGN PO) Take 1 tablet by mouth daily.   rosuvastatin (CRESTOR) 5 MG tablet Take 5 mg by mouth 2 (two) times a week.   sodium chloride (OCEAN) 0.65 % SOLN nasal spray Place 1 spray into both nostrils as needed for congestion.    Allergies  Allergen Reactions   Bee Venom Anaphylaxis   Coconut Fatty Acid     Irritation of mouth,. Minimal swelling in tongue & throat   Lipitor [Atorvastatin]     Cramps in hands    Omeprazole Nausea And Vomiting    Can take for only 2 days. 3rd day will have severe nausea and vomiting   Pantoprazole     Can take for only 2 days. 3rd day will have severe nausea and vomiting   Sulfamethoxazole-Trimethoprim Hives and Itching   Adhesive [Tape] Rash   Bisoprolol Other (See Comments)    Constipation       Review of Systems negative except from HPI and PMH  Physical Exam BP 124/86   Pulse 67   Ht 5\' 4"  (1.626 m)   Wt 196 lb 9.6 oz (89.2 kg)   BMI 33.75 kg/m  Well developed and nourished in no acute distress HENT normal Neck supple with JVP-  flat   Clear Regular rate and rhythm, no murmurs or gallops Abd-soft with active BS No Clubbing cyanosis edema Skin-warm and dry A & Oriented  Grossly normal sensory and motor function  ECG sinus @ 67 17/07/40   03/02/21 ECG: sinus at 63 Intervals 14/07/35 Nonspecific T wave changes  CrCl cannot be calculated (Patient's most recent lab result is older than the maximum 21 days allowed.).   Assessment and  Plan  Atrial fibrillation RVR paroxysmal   Intraatrial lipomatous septum   HFpEF  Breast Ca   Weight gain  Sleep Apnea    Patient has had increasing episodes of atrial fibrillation following interval up titration of her levothyroxine undertaken to try to ameliorate her fatigue.  There was no benefit on the latter I have encouraged her to discuss with her primary endocrinologist the down titration of the levothyroxine.  If  thereafter the atrial fibrillation burden does not recede, we will increase her flecainide from 50--75 mg twice daily.  Her ECG parameters are not an issue.  No bleeding.  Will continue on the Eliquis at 5 twice daily.  Her weight is up about 25 pounds I encouraged her to increase her  exercise including water aerobics and to discuss with Dr. Cleon Dew or some kind of low-carb diet

## 2023-07-26 ENCOUNTER — Ambulatory Visit: Payer: Medicare Other | Admitting: Internal Medicine

## 2023-07-27 DIAGNOSIS — Z23 Encounter for immunization: Secondary | ICD-10-CM | POA: Diagnosis not present

## 2023-07-27 DIAGNOSIS — E559 Vitamin D deficiency, unspecified: Secondary | ICD-10-CM | POA: Diagnosis not present

## 2023-07-27 DIAGNOSIS — Z853 Personal history of malignant neoplasm of breast: Secondary | ICD-10-CM | POA: Diagnosis not present

## 2023-07-27 DIAGNOSIS — M8580 Other specified disorders of bone density and structure, unspecified site: Secondary | ICD-10-CM | POA: Diagnosis not present

## 2023-07-27 DIAGNOSIS — E669 Obesity, unspecified: Secondary | ICD-10-CM | POA: Diagnosis not present

## 2023-07-27 DIAGNOSIS — K219 Gastro-esophageal reflux disease without esophagitis: Secondary | ICD-10-CM | POA: Diagnosis not present

## 2023-07-27 DIAGNOSIS — J45909 Unspecified asthma, uncomplicated: Secondary | ICD-10-CM | POA: Diagnosis not present

## 2023-07-27 DIAGNOSIS — E785 Hyperlipidemia, unspecified: Secondary | ICD-10-CM | POA: Diagnosis not present

## 2023-07-27 DIAGNOSIS — N1831 Chronic kidney disease, stage 3a: Secondary | ICD-10-CM | POA: Diagnosis not present

## 2023-07-27 DIAGNOSIS — I4891 Unspecified atrial fibrillation: Secondary | ICD-10-CM | POA: Diagnosis not present

## 2023-07-27 DIAGNOSIS — E039 Hypothyroidism, unspecified: Secondary | ICD-10-CM | POA: Diagnosis not present

## 2023-08-10 ENCOUNTER — Other Ambulatory Visit: Payer: Self-pay | Admitting: Internal Medicine

## 2023-08-10 DIAGNOSIS — I480 Paroxysmal atrial fibrillation: Secondary | ICD-10-CM

## 2023-08-10 NOTE — Telephone Encounter (Signed)
Prescription refill request for Eliquis received. Indication: Afib Last office visit: 07/18/23 Victoria Pacheco) Scr: 1.12 (01/26/23 via [facility])  Age: 72 Weight: 89.2kg  Appropriate dose. Refill sent.

## 2023-10-31 ENCOUNTER — Encounter: Payer: Self-pay | Admitting: Internal Medicine

## 2023-10-31 ENCOUNTER — Ambulatory Visit: Payer: Medicare Other | Attending: Internal Medicine | Admitting: Internal Medicine

## 2023-10-31 VITALS — BP 112/74 | HR 70 | Ht 64.0 in | Wt 193.8 lb

## 2023-10-31 DIAGNOSIS — I5032 Chronic diastolic (congestive) heart failure: Secondary | ICD-10-CM | POA: Diagnosis not present

## 2023-10-31 DIAGNOSIS — I480 Paroxysmal atrial fibrillation: Secondary | ICD-10-CM | POA: Insufficient documentation

## 2023-10-31 MED ORDER — FUROSEMIDE 20 MG PO TABS: 20.0000 mg | ORAL_TABLET | ORAL | 3 refills | Status: DC | PRN

## 2023-10-31 NOTE — Progress Notes (Signed)
 2      Patient Care Team: Adrian Prince, MD as PCP - General (Endocrinology) Ola Spurr., MD as Consulting Physician (Obstetrics and Gynecology)   HPI  Victoria Pacheco is a 73 y.o. female seen in follow up of paroxysmal afib in the context of breast CA. anticoagulation with apixaban and rhythm control with flecainide.  no  bleeding  Because of ongoing complaints of fatigue, she discussed with Dr. Ardyth Harps and up titration of her levothyroxine undertaken a few months ago from 100--112 mcg.  With this is occurred worsening fatigue but also worsening frequency of atrial fibrillation.  Down titration of the thyroid replacement was associate with a decreasing burden of atrial fibrillation.  Her fatigue she associates with the initiation of the flecainide and the beta-blockers, other beta-blockers having failed because of constipation       DATE TEST EF    5/21 Echo   65-70 % LA  Normal    7/22 Exercise Tolerance Test     BP had a hypertensive response, no T wave inversion nor ST segment deviation.    DATE PR interval QRSduration Dose  6/22  168 68 0  1/23 178 80 50  10/23 166 70 50  11/24 170 10 50     DATE Cr K Hgb  10/16  1.17 3.9   1/19   12.7  }11/22 1.13 4.3 12.7  5/24 1.12       Thromboembolic risk factors ( age -70, Gender-1) for a CHADSVASc Score of >=2  Records and Results Reviewed   Past Medical History:  Diagnosis Date   Allergic rhinitis    Asthma    related to allergy to cockateil (bird)    Cancer [facility])    Breast- stage one , stereotactic core biopsy - 08/2017 & again in 09/2017   Diffuse cystic mastopathy    Family history of breast cancer    GERD (gastroesophageal reflux disease)    Headache    hx of cluster headache , better since post menopausal   History of kidney stones    passed spontaneously - fr. a dietary supplement    Hx of colonic polyps    Hypercholesteremia    Hypothyroidism 1989   thyroid scan done with radio iodine    Left knee  injury    Multiple lipomas    Osteopenia    Pneumonia    not hosp. - treated with antibiotics ( several courses)    Unspecified menopausal and postmenopausal disorder    Vitamin D deficiency     Past Surgical History:  Procedure Laterality Date   benign right breast biopsy  2002   BREAST LUMPECTOMY WITH RADIOACTIVE SEED AND SENTINEL LYMPH NODE BIOPSY Right 10/10/2017   Procedure: BREAST LUMPECTOMY WITH RADIOACTIVE SEED AND SENTINEL LYMPH NODE BIOPSY;  Surgeon: Emelia Loron, MD;  Location: [facility] OR;  Service: General;  Laterality: Right;   TONSILLECTOMY AND ADENOIDECTOMY     at age 34   WISDOM TOOTH EXTRACTION     as a teen    Current Meds  Medication Sig   acetaminophen (TYLENOL) 650 MG CR tablet Take 650 mg by mouth daily as needed for pain.   anastrozole (ARIMIDEX) 1 MG tablet Take 1 mg by mouth daily.   calcium carbonate (TUMS - DOSED IN MG ELEMENTAL CALCIUM) 500 MG chewable tablet Chew 1 tablet by mouth daily as needed for indigestion or heartburn.   Cholecalciferol (VITAMIN D-3) 1000 UNITS CAPS Take 1,000 Units by mouth daily.  diphenhydrAMINE (BENADRYL) 25 MG tablet Take 25 mg by mouth every 6 (six) hours as needed for itching or allergies.   ELIQUIS 5 MG TABS tablet TAKE 1 TABLET BY MOUTH TWICE A DAY   famotidine (PEPCID) 20 MG tablet Take 10 mg by mouth 2 (two) times daily.    flecainide (TAMBOCOR) 50 MG tablet TAKE 1 TABLET BY MOUTH TWICE A DAY   fluticasone (FLONASE) 50 MCG/ACT nasal spray Place 1 spray into both nostrils daily as needed for allergies or rhinitis.   furosemide (LASIX) 20 MG tablet TAKE 1 TABLET BY MOUTH AS NEEDED   Homeopathic Products (ARNICARE ARNICA) CREA Apply 1 application topically daily as needed (bruising).   Levothyroxine Sodium 100 MCG CAPS Take 100 mcg by mouth daily.   loratadine (CLARITIN) 10 MG tablet Take 10 mg by mouth daily as needed for allergies.   Magnesium Oxide 400 MG CAPS Take 1 capsule (400 mg total) by mouth daily.   Multiple  Vitamin (MULTI-VITAMIN) tablet Take 1 tablet by mouth as needed.   pindolol (VISKEN) 5 MG tablet Take 0.5 tablets (2.5 mg total) by mouth daily.   Probiotic Product (ALIGN PO) Take 1 tablet by mouth daily.   rosuvastatin (CRESTOR) 5 MG tablet Take 5 mg by mouth 2 (two) times a week.   sodium chloride (OCEAN) 0.65 % SOLN nasal spray Place 1 spray into both nostrils as needed for congestion.    Allergies  Allergen Reactions   Bee Venom Anaphylaxis   Coconut Fatty Acid     Irritation of mouth,. Minimal swelling in tongue & throat   Lipitor [Atorvastatin]     Cramps in hands    Omeprazole Nausea And Vomiting    Can take for only 2 days. 3rd day will have severe nausea and vomiting   Pantoprazole     Can take for only 2 days. 3rd day will have severe nausea and vomiting   Sulfamethoxazole-Trimethoprim Hives and Itching   Adhesive [Tape] Rash   Bisoprolol Other (See Comments)    Constipation       Review of Systems negative except from HPI and PMH  Physical Exam BP 112/74   Pulse 70   Ht 5\' 4"  (1.626 m)   Wt 193 lb 12.8 oz (87.9 kg)   SpO2 97%   BMI 33.27 kg/m  Well developed and nourished in no acute distress HENT normal Neck supple   Clear Regular rate and rhythm, no murmurs or gallops Abd-soft with active BS No Clubbing cyanosis edema Skin-warm and dry A & Oriented  Grossly normal sensory and motor function     CrCl cannot be calculated (Patient's most recent lab result is older than the maximum 21 days allowed.).   Assessment and  Plan  Atrial fibrillation RVR paroxysmal   Intraatrial lipomatous septum   HFpEF  Breast Ca   Weight gain  Sleep Apnea    Patient's atrial fibrillation burden is less since the levothyroxine was down titrated back to 100 mcg a day.  Still having episodes lasting more than 24 hours at least once a month.  Discussed a variety of alternatives including 1-increasing the flecainide.  She was not inclined towards this has in her mind  the flecainide is associated with fatigue.  2-hospitalization with initiation of dofetilide; she is leaning towards this.  3-catheter ablation and she is disinclined towards this because she does not want "scar on her heart ".  Given the fatigue and its association in her mind with the flecainide and  the pindolol, we will serially exclude them the flecainide first and the pindolol second.  This may give her greater clarity as to the impact of these drugs on her functional status and the potential benefits of an alternative strategy like dofetilide.  She has some peripheral edema.  Will continue her on as needed furosemide.

## 2023-10-31 NOTE — Patient Instructions (Addendum)
 Medication Instructions:  Your physician has recommended you make the following change in your medication:   Hold Flecainide x 4 weeks then begin holding Pindolol x 4 weeks.  *If you need a refill on your cardiac medications before your next appointment, please call your pharmacy*   Lab Work: None ordered.  If you have labs (blood work) drawn today and your tests are completely normal, you will receive your results only by: MyChart Message (if you have MyChart) OR A paper copy in the mail If you have any lab test that is abnormal or we need to change your treatment, we will call you to review the results.   Testing/Procedures: None ordered.    Follow-Up: At [facility], you and your health needs are our priority.  As part of our continuing mission to provide you with exceptional heart care, we have created designated Provider Care Teams.  These Care Teams include your primary Cardiologist (physician) and Advanced Practice Providers (APPs -  Physician Assistants and Nurse Practitioners) who all work together to provide you with the care you need, when you need it.  We recommend signing up for the patient portal called "MyChart".  Sign up information is provided on this After Visit Summary.  MyChart is used to connect with patients for Virtual Visits (Telemedicine).  Patients are able to view lab/test results, encounter notes, upcoming appointments, etc.  Non-urgent messages can be sent to your provider as well.   To learn more about what you can do with MyChart, go to [email or web address].    Your next appointment:   8 week telephone visit - I will have his scheduler assist with this appointment.

## 2023-11-07 DIAGNOSIS — Z170 Estrogen receptor positive status [ER+]: Secondary | ICD-10-CM | POA: Diagnosis not present

## 2023-11-07 DIAGNOSIS — C50511 Malignant neoplasm of lower-outer quadrant of right female breast: Secondary | ICD-10-CM | POA: Diagnosis not present

## 2023-12-19 DIAGNOSIS — L84 Corns and callosities: Secondary | ICD-10-CM | POA: Diagnosis not present

## 2023-12-19 DIAGNOSIS — L565 Disseminated superficial actinic porokeratosis (DSAP): Secondary | ICD-10-CM | POA: Diagnosis not present

## 2023-12-19 DIAGNOSIS — M216X1 Other acquired deformities of right foot: Secondary | ICD-10-CM | POA: Diagnosis not present

## 2023-12-19 DIAGNOSIS — M216X2 Other acquired deformities of left foot: Secondary | ICD-10-CM | POA: Diagnosis not present

## 2023-12-19 DIAGNOSIS — L602 Onychogryphosis: Secondary | ICD-10-CM | POA: Diagnosis not present

## 2023-12-19 DIAGNOSIS — M79671 Pain in right foot: Secondary | ICD-10-CM | POA: Diagnosis not present

## 2023-12-19 DIAGNOSIS — M79672 Pain in left foot: Secondary | ICD-10-CM | POA: Diagnosis not present

## 2024-01-02 ENCOUNTER — Ambulatory Visit: Payer: Medicare Other | Admitting: Internal Medicine

## 2024-01-02 NOTE — Progress Notes (Unsigned)
 Cardiology Office Note:  .   Date:  01/02/2024  ID:  Victoria Pacheco, DOB Dec 06, 1950, MRN [id number] PCP: Victoria Coons, MD  Victoria Pacheco Providers Cardiologist:  None {  History of Present Illness: .   Victoria Pacheco is a 73 y.o. female w/PMHx of  HLD, hypothyroidism, OSA, obesity HFpEF Intraatrial lipomatous septum AFib  She saw Dr. Rodolfo Pacheco 10/31/23, waxing/waning AFib burden of late with adjustment in her thyroid  replacement. Continued to have AFib episodes lasting all day He discussed: -- Increased flecainide  dose though she felt like the flecainide  already made her feel tired (as well as the BB) NOTE that she has been intolerant of other BBs as well apparently 2/2 constipation -- dofetilide > would consider it -- ablation > she was not inclined to want to do this  Plan was to stop flecainide  > see if she felt less fatigued If not >> stop the BB >> re-visit fatigue Try and get a sense of if the meds truely causing the fatigue  Consider Tikosyn admit if truly the meds made her feel poorly   Today's visit is scheduled as an 8 week f/u ROS:   She stopped the flecainide  as directed, as of 2/24 and then as of April1st off the pindolol . 2 days after stopping flecainide  her constipation resolved and has had normal bowel habits since then And since off BOTH her QOL is night/day better She has good exertional capacity, carrying groceries easier, walking stairs, more energy, feels much better  She is having AFib about 2x a month lasting about an hour As of right now this burden is very acceptable to her   Arrhythmia/AAD hx Flecainide  started 2022 >> stopped Feb 2024 2/2 c/o fatigue  Studies Reviewed: Victoria Pacheco    EKG done today and reviewed by myself SR 85bpm  03/10/21: ETT Blood pressure demonstrated a hypertensive response to exercise. There was no ST segment deviation noted during stress. No T wave inversion was noted during stress.   1.  Negative EKG stress test for  ischemia. 2.  No evidence of exercise-induced arrhythmias. 3.  Poor functional capacity (2:38 min:s; 4.6 METS). 4.  Normal heart rate response to exercise. 5.  Hypertensive response to exercise (Peak 218/108).  Echo 01/31/20    1. Left ventricular ejection fraction, by estimation, is 65 to 70%. The  left ventricle has normal function. The left ventricle has no regional  wall motion abnormalities. Left ventricular diastolic parameters were  normal. The average left ventricular global longitudinal strain is -24.4 %. The global longitudinal strain is normal.   2. Right ventricular systolic function is normal. The right ventricular  size is normal. Tricuspid regurgitation signal is inadequate for assessing PA pressure.   3. The mitral valve is normal in structure. Trivial mitral valve  regurgitation. No evidence of mitral stenosis.   4. The aortic valve is tricuspid. Aortic valve regurgitation is trivial.  No aortic stenosis is present.   5. The inferior vena cava is normal in size with greater than 50%  respiratory variability, suggesting right atrial pressure of 3 mmHg.    Risk Assessment/Calculations:    Physical Exam:   VS:  There were no vitals taken for this visit.   Wt Readings from Last 3 Encounters:  10/31/23 193 lb 12.8 oz (87.9 kg)  07/18/23 196 lb 9.6 oz (89.2 kg)  07/01/22 194 lb 3.2 oz (88.1 kg)    GEN: Well nourished, well developed in no acute distress NECK: No JVD; No carotid bruits CARDIAC:  RRR, no murmurs, rubs, gallops RESPIRATORY:  CTA b/l without rales, wheezing or rhonchi  ABDOMEN: Soft, non-tender, non-distended EXTREMITIES:  No edema; No deformity   ASSESSMENT AND PLAN: .    paroxysmal AFib CHA2DS2Vasc is 3, on eliquis , appropriately dosed low burden by symptoms  She would 1st be interested in a PRN med should her burden increase She would be open to Tikosyn if needed Would like to avoid ablation  For now, follow her AF burden  HFpEF No exam  findings, symptoms of volume OL  Secondary hypercoagulable state 2/2 AFib     Dispo: back in 4 mo, sooner if needed    Signed, Victoria Fails, PA-C

## 2024-01-03 ENCOUNTER — Encounter: Payer: Self-pay | Admitting: Physician Assistant

## 2024-01-03 ENCOUNTER — Ambulatory Visit: Attending: Physician Assistant | Admitting: Physician Assistant

## 2024-01-03 VITALS — BP 116/70 | HR 85 | Wt 191.8 lb

## 2024-01-03 DIAGNOSIS — I480 Paroxysmal atrial fibrillation: Secondary | ICD-10-CM | POA: Insufficient documentation

## 2024-01-03 DIAGNOSIS — I5032 Chronic diastolic (congestive) heart failure: Secondary | ICD-10-CM | POA: Diagnosis not present

## 2024-01-03 DIAGNOSIS — D6869 Other thrombophilia: Secondary | ICD-10-CM | POA: Diagnosis not present

## 2024-01-03 NOTE — Patient Instructions (Addendum)
 Medication Instructions:   Your physician recommends that you continue on your current medications as directed. Please refer to the Current Medication list given to you today.  *If you need a refill on your cardiac medications before your next appointment, please call your pharmacy*  Lab Work:  NONE ORDERED  TODAY   If you have labs (blood work) drawn today and your tests are completely normal, you will receive your results only by: MyChart Message (if you have MyChart) OR A paper copy in the mail If you have any lab test that is abnormal or we need to change your treatment, we will call you to review the results.  Testing/Procedures:  NONE ORDERED  TODAY    Follow-Up: At [facility], you and your health needs are our priority.  As part of our continuing mission to provide you with exceptional heart care, our providers are all part of one team.  This team includes your primary Cardiologist (physician) and Advanced Practice Providers or APPs (Physician Assistants and Nurse Practitioners) who all work together to provide you with the care you need, when you need it.  Your next appointment:    4 month(s)  ( CONTACT  CASSIE HALL/ ANGELINE HAMMER FOR EP SCHEDULING ISSUES )   Provider:   Mertha Abrahams, PA-C    We recommend signing up for the patient portal called "MyChart".  Sign up information is provided on this After Visit Summary.  MyChart is used to connect with patients for Virtual Visits (Telemedicine).  Patients are able to view lab/test results, encounter notes, upcoming appointments, etc.  Non-urgent messages can be sent to your provider as well.   To learn more about what you can do with MyChart, go to [email or web address].   Other Instructions

## 2024-01-18 ENCOUNTER — Other Ambulatory Visit: Payer: Self-pay | Admitting: Internal Medicine

## 2024-01-26 ENCOUNTER — Telehealth: Payer: Self-pay | Admitting: Internal Medicine

## 2024-01-26 DIAGNOSIS — I480 Paroxysmal atrial fibrillation: Secondary | ICD-10-CM

## 2024-01-26 MED ORDER — APIXABAN 5 MG PO TABS
5.0000 mg | ORAL_TABLET | Freq: Two times a day (BID) | ORAL | 0 refills | Status: DC
Start: 2024-01-26 — End: 2024-02-13

## 2024-01-26 NOTE — Telephone Encounter (Signed)
*  STAT* If patient is at the pharmacy, call can be transferred to refill team.   1. Which medications need to be refilled? (please list name of each medication and dose if known) ELIQUIS 5 MG TABS tablet  2. Which pharmacy/location (including street and city if local pharmacy) is medication to be sent to? [facility] [id number] - [place], [place] [address]  3. Do they need a 30 day or 90 day supply? [place]

## 2024-02-02 DIAGNOSIS — E785 Hyperlipidemia, unspecified: Secondary | ICD-10-CM | POA: Diagnosis not present

## 2024-02-02 DIAGNOSIS — E039 Hypothyroidism, unspecified: Secondary | ICD-10-CM | POA: Diagnosis not present

## 2024-02-02 DIAGNOSIS — E559 Vitamin D deficiency, unspecified: Secondary | ICD-10-CM | POA: Diagnosis not present

## 2024-02-02 DIAGNOSIS — K219 Gastro-esophageal reflux disease without esophagitis: Secondary | ICD-10-CM | POA: Diagnosis not present

## 2024-02-02 DIAGNOSIS — N1831 Chronic kidney disease, stage 3a: Secondary | ICD-10-CM | POA: Diagnosis not present

## 2024-02-02 DIAGNOSIS — Z1212 Encounter for screening for malignant neoplasm of rectum: Secondary | ICD-10-CM | POA: Diagnosis not present

## 2024-02-09 DIAGNOSIS — E669 Obesity, unspecified: Secondary | ICD-10-CM | POA: Diagnosis not present

## 2024-02-09 DIAGNOSIS — E039 Hypothyroidism, unspecified: Secondary | ICD-10-CM | POA: Diagnosis not present

## 2024-02-09 DIAGNOSIS — Z853 Personal history of malignant neoplasm of breast: Secondary | ICD-10-CM | POA: Diagnosis not present

## 2024-02-09 DIAGNOSIS — Z1339 Encounter for screening examination for other mental health and behavioral disorders: Secondary | ICD-10-CM | POA: Diagnosis not present

## 2024-02-09 DIAGNOSIS — J45909 Unspecified asthma, uncomplicated: Secondary | ICD-10-CM | POA: Diagnosis not present

## 2024-02-09 DIAGNOSIS — K219 Gastro-esophageal reflux disease without esophagitis: Secondary | ICD-10-CM | POA: Diagnosis not present

## 2024-02-09 DIAGNOSIS — R82998 Other abnormal findings in urine: Secondary | ICD-10-CM | POA: Diagnosis not present

## 2024-02-09 DIAGNOSIS — E785 Hyperlipidemia, unspecified: Secondary | ICD-10-CM | POA: Diagnosis not present

## 2024-02-09 DIAGNOSIS — Z0000 Encounter for general adult medical examination without abnormal findings: Secondary | ICD-10-CM | POA: Diagnosis not present

## 2024-02-09 DIAGNOSIS — G720 Drug-induced myopathy: Secondary | ICD-10-CM | POA: Diagnosis not present

## 2024-02-09 DIAGNOSIS — N1831 Chronic kidney disease, stage 3a: Secondary | ICD-10-CM | POA: Diagnosis not present

## 2024-02-09 DIAGNOSIS — Z1331 Encounter for screening for depression: Secondary | ICD-10-CM | POA: Diagnosis not present

## 2024-02-09 DIAGNOSIS — Z1212 Encounter for screening for malignant neoplasm of rectum: Secondary | ICD-10-CM | POA: Diagnosis not present

## 2024-02-09 DIAGNOSIS — I4891 Unspecified atrial fibrillation: Secondary | ICD-10-CM | POA: Diagnosis not present

## 2024-02-11 ENCOUNTER — Other Ambulatory Visit: Payer: Self-pay | Admitting: Internal Medicine

## 2024-02-11 DIAGNOSIS — I480 Paroxysmal atrial fibrillation: Secondary | ICD-10-CM

## 2024-02-13 NOTE — Telephone Encounter (Signed)
 Prescription refill request for Eliquis  received. Indication: PAF Last office visit: 01/03/24   Acie Holiday PA-C Scr: 1.12 on 5/24 Age: 73 Weight: 87kg  Based on above findings Eliquis  5mg  twice daily is the appropriate dose.  Refill approved.

## 2024-03-01 DIAGNOSIS — N905 Atrophy of vulva: Secondary | ICD-10-CM | POA: Diagnosis not present

## 2024-03-01 DIAGNOSIS — N952 Postmenopausal atrophic vaginitis: Secondary | ICD-10-CM | POA: Diagnosis not present

## 2024-03-01 DIAGNOSIS — Z01419 Encounter for gynecological examination (general) (routine) without abnormal findings: Secondary | ICD-10-CM | POA: Diagnosis not present

## 2024-03-05 DIAGNOSIS — Z1231 Encounter for screening mammogram for malignant neoplasm of breast: Secondary | ICD-10-CM | POA: Diagnosis not present

## 2024-04-09 NOTE — Progress Notes (Unsigned)
 Cardiology Office Note:  .   Date:  04/09/2024  ID:  [name], DOB 24-Nov-1950, MRN [id number] PCP: Nichole Senior, MD  [facility] Providers Cardiologist:  None { EP: Dr. Fernande  History of Present Illness: .   Chiana Wamser is a 73 y.o. female w/PMHx of  HLD, hypothyroidism, OSA, obesity HFpEF Intraatrial lipomatous septum AFib  She saw Dr. Fernande 10/31/23, waxing/waning AFib burden of late with adjustment in her thyroid  replacement. Continued to have AFib episodes lasting all day He discussed: -- Increased flecainide  dose though she felt like the flecainide  already made her feel tired (as well as the BB) NOTE that she has been intolerant of other BBs as well apparently 2/2 constipation -- dofetilide > would consider it -- ablation > she was not inclined to want to do this  Plan was to stop flecainide  > see if she felt less fatigued If not >> stop the BB >> re-visit fatigue Try and get a sense of if the meds truely causing the fatigue  Consider Tikosyn admit if truly the meds made her feel poorly  I saw her 01/03/24 She stopped the flecainide  as directed, as of 2/24 and then as of April1st off the pindolol . 2 days after stopping flecainide  her constipation resolved and has had normal bowel habits since then And since off BOTH her QOL is night/day better She has good exertional capacity, carrying groceries easier, walking stairs, more energy, feels much better She is having AFib about 2x a month lasting about an hour As of right now this burden is very acceptable to her  She preferred to monitor burden and treat conservatively  Though if needed, would consider Tikosyn, wanted to avoid procedures  Today's visit is scheduled as an 63mo f/u ROS:   She generally feels well though is having more Afib Notes generally seems to be start in the evenings, mostly a couple hours, some can last 8 hours or mor, rarely all day ~ every few days to about every 10 days AFib makes her  feel tired, and can feel heart beat, when lasting through the night seems to make her sleep restless Rates can get towards 150's or so  When not in AFib she feels well No CP, SOB, DOE No near syncope or syncope Reports good medication compliance, no bleeding or signs of bleeding  She is reluctant to try Tikosyn, given her experience with Flecainide , would hate to spend the days in the hospital only to find out she can't tolerate it   Arrhythmia/AAD hx Flecainide  started 2022 >> stopped Feb 2024 2/2 c/o fatigue  Studies Reviewed: SABRA    EKG done today and reviewed by myself AFib 160bpm  03/10/21: ETT Blood pressure demonstrated a hypertensive response to exercise. There was no ST segment deviation noted during stress. No T wave inversion was noted during stress.   1.  Negative EKG stress test for ischemia. 2.  No evidence of exercise-induced arrhythmias. 3.  Poor functional capacity (2:38 min:s; 4.6 METS). 4.  Normal heart rate response to exercise. 5.  Hypertensive response to exercise (Peak 218/108).  Echo 01/31/20    1. Left ventricular ejection fraction, by estimation, is 65 to 70%. The  left ventricle has normal function. The left ventricle has no regional  wall motion abnormalities. Left ventricular diastolic parameters were  normal. The average left ventricular global longitudinal strain is -24.4 %. The global longitudinal strain is normal.   2. Right ventricular systolic function is normal. The right ventricular  size is normal. Tricuspid regurgitation signal is inadequate for assessing PA pressure.   3. The mitral valve is normal in structure. Trivial mitral valve  regurgitation. No evidence of mitral stenosis.   4. The aortic valve is tricuspid. Aortic valve regurgitation is trivial.  No aortic stenosis is present.   5. The inferior vena cava is normal in size with greater than 50%  respiratory variability, suggesting right atrial pressure of 3 mmHg.    Risk  Assessment/Calculations:    Physical Exam:   VS:  There were no vitals taken for this visit.   Wt Readings from Last 3 Encounters:  01/03/24 191 lb 12.8 oz (87 kg)  10/31/23 193 lb 12.8 oz (87.9 kg)  07/18/23 196 lb 9.6 oz (89.2 kg)    GEN: Well nourished, well developed in no acute distress NECK: No JVD; No carotid bruits CARDIAC: irreg-irreg, tachycardic, no murmurs, rubs, gallops RESPIRATORY: CTA b/l without rales, wheezing or rhonchi  ABDOMEN: Soft, non-tender, non-distended EXTREMITIES: No edema; No deformity   ASSESSMENT AND PLAN: .    paroxysmal AFib CHA2DS2Vasc is 3, on eliquis , appropriately dosed  She is very surprised to here she is in Afib today, feeling very well today with no symptoms of any kind Start diltiazem  120mg  daily AFib clinic/EKG scheduled for Thursday >> titrate/further plans pending this  HFpEF No exam findings, symptoms of volume OL  Secondary hypercoagulable state 2/2 AFib   She will need a new EP MD, will plan to transition to Dr. Almetta most likely    Dispo: back in 86mo otherwise, sooner if needed    Signed, Charlies Macario Arthur, PA-C

## 2024-04-10 ENCOUNTER — Ambulatory Visit: Attending: Physician Assistant | Admitting: Physician Assistant

## 2024-04-10 VITALS — BP 110/70 | HR 100 | Ht 64.0 in | Wt 189.0 lb

## 2024-04-10 DIAGNOSIS — I480 Paroxysmal atrial fibrillation: Secondary | ICD-10-CM | POA: Insufficient documentation

## 2024-04-10 DIAGNOSIS — I5032 Chronic diastolic (congestive) heart failure: Secondary | ICD-10-CM | POA: Diagnosis not present

## 2024-04-10 DIAGNOSIS — D6869 Other thrombophilia: Secondary | ICD-10-CM | POA: Insufficient documentation

## 2024-04-10 MED ORDER — DILTIAZEM HCL ER COATED BEADS 120 MG PO CP24: 120.0000 mg | ORAL_CAPSULE | Freq: Every day | ORAL | 3 refills | Status: DC

## 2024-04-10 NOTE — Patient Instructions (Signed)
 Medication Instructions:   START TAKING:  DILTIAZEM  120 MG ONCE A DAY   *If you need a refill on your cardiac medications before your next appointment, please call your pharmacy*   Lab Work: NONE ORDERED  TODAY     If you have labs (blood work) drawn today and your tests are completely normal, you will receive your results only by: MyChart Message (if you have MyChart) OR A paper copy in the mail If you have any lab test that is abnormal or we need to change your treatment, we will call you to review the results.   Testing/Procedures: NONE ORDERED  TODAY    Follow-Up: At [facility], you and your health needs are our priority.  As part of our continuing mission to provide you with exceptional heart care, our providers are all part of one team.  This team includes your primary Cardiologist (physician) and Advanced Practice Providers or APPs (Physician Assistants and Nurse Practitioners) who all work together to provide you with the care you need, when you need it.  Your next appointment:  AS SCHEDULED WITH AFIB CLINIC   1 month(s)  Provider:   Charlies Arthur, PA-C ( CONTACT  CASSIE HALL/ ANGELINE HAMMER FOR EP SCHEDULING ISSUES )   We recommend signing up for the patient portal called MyChart.  Sign up information is provided on this After Visit Summary.  MyChart is used to connect with patients for Virtual Visits (Telemedicine).  Patients are able to view lab/test results, encounter notes, upcoming appointments, etc.  Non-urgent messages can be sent to your provider as well.   To learn more about what you can do with MyChart, go to [email or web address].     Other Instructions

## 2024-04-12 ENCOUNTER — Ambulatory Visit (HOSPITAL_COMMUNITY)
Admission: RE | Admit: 2024-04-12 | Discharge: 2024-04-12 | Disposition: A | Discharge status: Home / Self Care | Source: Ambulatory Visit | Attending: Internal Medicine | Admitting: Internal Medicine

## 2024-04-12 VITALS — HR 86

## 2024-04-12 DIAGNOSIS — I4891 Unspecified atrial fibrillation: Secondary | ICD-10-CM | POA: Diagnosis not present

## 2024-04-12 NOTE — Progress Notes (Signed)
 Patient seen by Charlies Arthur, PA-C, on 04/10/24 and noted to be in Afib with RVR (HR 160).   ECG today shows  Vent. rate 86 BPM PR interval 142 ms QRS duration 70 ms QT/QTcB 350/418 ms P-R-T axes 43 -15 138 Normal sinus rhythm Nonspecific ST and T wave abnormality Abnormal ECG When compared with ECG of 10-Apr-2024 16:06, Sinus rhythm has replaced Atrial fibrillation   Continue diltiazem  120 mg daily. We briefly discussed if she has continued Afib burden may need to revisit original discussion of dofetilide and/or ablation. Patient hesitant with either choice. Follow up as scheduled with Charlies Arthur, PA-C.

## 2024-05-13 NOTE — Progress Notes (Unsigned)
 Cardiology Office Note:  .   Date:  05/13/2024  ID:  Victoria Pacheco, DOB 09-10-1950, MRN [id number] PCP: Victoria Senior, MD  [facility] Providers Cardiologist:  None { EP: Dr. Fernande  History of Present Illness: .   Victoria Pacheco is a 73 y.o. female w/PMHx of  HLD, hypothyroidism, OSA, obesity HFpEF Intraatrial lipomatous septum AFib  She saw Dr. Fernande 10/31/23, waxing/waning AFib burden of late with adjustment in her thyroid  replacement. Continued to have AFib episodes lasting all day He discussed: -- Increased flecainide  dose though she felt like the flecainide  already made her feel tired (as well as the BB) NOTE that she has been intolerant of other BBs as well apparently 2/2 constipation -- dofetilide > would consider it -- ablation > she was not inclined to want to do this  Plan was to stop flecainide  > see if she felt less fatigued If not >> stop the BB >> re-visit fatigue Try and get a sense of if the meds truely causing the fatigue  Consider Tikosyn admit if truly the meds made her feel poorly  I saw her 01/03/24 She stopped the flecainide  as directed, as of 2/24 and then as of April1st off the pindolol . 2 days after stopping flecainide  her constipation resolved and has had normal bowel habits since then And since off BOTH her QOL is night/day better She has good exertional capacity, carrying groceries easier, walking stairs, more energy, feels much better She is having AFib about 2x a month lasting about an hour As of right now this burden is very acceptable to her  She preferred to monitor burden and treat conservatively  Though if needed, would consider Tikosyn, wanted to avoid procedures  I saw her 04/10/24 She generally feels well though is having more Afib Notes generally seems to be start in the evenings, mostly a couple hours, some can last 8 hours or mor, rarely all day ~ every few days to about every 10 days AFib makes her feel tired, and can feel heart  beat, when lasting through the night seems to make her sleep restless Rates can get towards 150's or so  When not in AFib she feels well No CP, SOB, DOE No near syncope or syncope Reports good medication compliance, no bleeding or signs of bleeding  She is reluctant to try Tikosyn, given her experience with Flecainide , would hate to spend the days in the hospital only to find out she can't tolerate it  She was in AFib and she was surprised to hear it >> unaware/completely asymptomatic Dilt increased with plans for early f/u  She saw AFib clinic for EKG rhythm/rate check only, 04/12/24 SR 86bpm  Today's visit is scheduled as an 48mo f/u ROS:   She is feeling well today Again surprised to hear she is out of rhythm  Says that sometimes she has an awareness of her AFib, feels it in her chest Sometimes feels a little winded with activities. Generally though feels well, and did not think she was having much Afib  No CP No near syncope or syncope No bleeding or signs of bleeding   Arrhythmia/AAD hx Flecainide  started 2022 >> stopped Feb 2024 2/2 c/o fatigue  Studies Reviewed: SABRA    EKG done today and reviewed by myself AFlutter (atypical) vs coarse AFib 162bpm  03/10/21: ETT Blood pressure demonstrated a hypertensive response to exercise. There was no ST segment deviation noted during stress. No T wave inversion was noted during stress.   1.  Negative EKG stress test for ischemia. 2.  No evidence of exercise-induced arrhythmias. 3.  Poor functional capacity (2:38 min:s; 4.6 METS). 4.  Normal heart rate response to exercise. 5.  Hypertensive response to exercise (Peak 218/108).  Echo 01/31/20    1. Left ventricular ejection fraction, by estimation, is 65 to 70%. The  left ventricle has normal function. The left ventricle has no regional  wall motion abnormalities. Left ventricular diastolic parameters were  normal. The average left ventricular global longitudinal strain is -24.4  %. The global longitudinal strain is normal.   2. Right ventricular systolic function is normal. The right ventricular  size is normal. Tricuspid regurgitation signal is inadequate for assessing PA pressure.   3. The mitral valve is normal in structure. Trivial mitral valve  regurgitation. No evidence of mitral stenosis.   4. The aortic valve is tricuspid. Aortic valve regurgitation is trivial.  No aortic stenosis is present.   5. The inferior vena cava is normal in size with greater than 50%  respiratory variability, suggesting right atrial pressure of 3 mmHg.    Risk Assessment/Calculations:    Physical Exam:   VS:  There were no vitals taken for this visit.   Wt Readings from Last 3 Encounters:  04/10/24 189 lb (85.7 kg)  01/03/24 191 lb 12.8 oz (87 kg)  10/31/23 193 lb 12.8 oz (87.9 kg)    GEN: Well nourished, well developed in no acute distress NECK: No JVD; No carotid bruits CARDIAC: irreg-irreg, tachycardic, no murmurs, rubs, gallops RESPIRATORY: CTA b/l without rales, wheezing or rhonchi  ABDOMEN: Soft, non-tender, non-distended EXTREMITIES: No edema; No deformity   ASSESSMENT AND PLAN: .    paroxysmal AFib CHA2DS2Vasc is 3, on eliquis , appropriately dosed  Symptoms of AFib less then reliable, though she does have intermittent awareness Rates unclear, though her watch today noted AFib ECG was c/w AFib and rate controlled  Discussed options for rhythm management She is weary about escalating meds >> trying another AAD She would like to see if she would be an ablation candidate  Watch data also notes SR, so do not think she is persistently in AFib She had spontaneous CV to SR after our last visit as well Watch data suggests that she has some fast rates, though not consistently Will increase her dilt to 240mg  and have her see Dr. 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  Will update her echo  I have asked that she monitor her HR If her HR is consistently > 110 at home, I have asked her to let me know   HFpEF No exam findings, symptoms of volume OL  Secondary hypercoagulable state 2/2 AFib    Dispo: Dr. Almetta in the next 2-3 weeks, sooner if needed    Signed, Victoria Macario Arthur, PA-C

## 2024-05-14 DIAGNOSIS — C50511 Malignant neoplasm of lower-outer quadrant of right female breast: Secondary | ICD-10-CM | POA: Diagnosis not present

## 2024-05-14 DIAGNOSIS — Z170 Estrogen receptor positive status [ER+]: Secondary | ICD-10-CM | POA: Diagnosis not present

## 2024-05-14 NOTE — Progress Notes (Signed)
 Hematology/Oncology Progress note  Patient Name:  Victoria Pacheco Date of Birth:  Oct 19, 1950 Date of Encounter:  05/14/2024  Referring Provider:  No ref. provider found,    PCP:  Garnette DELENA Ore, MD  Diagnosis/Chief complaint/Reason for visit: 73 year old female with diagnosis of right breast cancer, Stage I (T1No).    Problem list: Patient Active Problem List  Diagnosis  . Carcinoma of lower outer quadrant of right breast (HCC)  . Aromatase inhibitor use    Past Oncologic Therapy Oncology History   No problem history exists.     History of Present Illness:    Victoria Pacheco is a  73 y.o. female who is seen in consultation at the request of  [place], Garnette Redbird, MD for an evaluation of stage I right breast cancer.  Patient underwent a screening mammogram early December 2018.  This showed a possible architectural distortion in the right  breast.  She also was noted to have bilateral breast occasions.  Patient had bilateral diagnostic mammograms performed with ultrasound on August 31, 2017.  The diagnostic mammogram, on the right side revealed a small spiculated mass containing several  punctate calcifications.  It lies laterally and goes up to posterior depth.  Measuring 6 mm in greatest dimension.  In the central right breast there was a 4.5 mm group of rounded punctate calcifications, without associated mass, distortion or linearity  or branching.  In the left breast upper outer quadrant a 4.5 mm group of round and punctate calcifications without associated mass, distortion, linearity or branching.  Patient had a targeted ultrasound performed that showed subtle hypoechoic apparent  mass in the 9 o'clock position of the right breast, 5 cm from the nipple measuring 6 x 5 x 5.5 mm.  Sonographically evaluation of the right axilla showed no enlarged or abnormal lymph nodes.  This was subtle on sonography and it was felt that patient  should undergo 3D stereotactic core needle biopsy of the small  spiculated right breast mass at the 9 o'clock position.  She had her biopsies performed on 09/05/2017 at the breast center of [facility].  The pathology revealed right needle core  biopsy, lower outer quadrant invasive ductal carcinoma, ductal carcinoma in situ with calcifications.  It was felt to be grade 1.  Prognostic panel revealed tumor to be estrogen receptor +95% progesterone receptor +50% proliferation marker Ki-67 5% and  HER-2/neu negative.  On 09/14/2017 patient had needle core biopsies performed on the left breast upper outer quadrant this showed fibroadenomatoid nodule with calcifications no malignancy.  Right breast needle core biopsy upper central X clip fibrocystic  changes and sclerosing adenosis with calcifications.  Patient was seen by Dr. Donnice Bury at [facility] surgery.  Recommendation has been made to undergo lumpectomy with sentinel lymph node biopsy.  Since patient lives in [facility] she is  referred to me as well as Dr. Glade Gala for evaluation of adjuvant therapy.  Patient was seen by Dr. Gala on 10/03/2017.  She has recommended that patient proceed with lumpectomy as scheduled.  Question is whether he should the patient have  genetic counseling and testing.  I had a detailed discussion regarding her family history.  There is some limited history and I do think that this patient should proceed with genetic counseling and if appropriate have testing performed.  Otherwise patient  feels well she denies any headaches double vision blurring of vision fevers chills or night sweats no chest pains palpitations no myalgias and  arthralgias.    Past oncologic history/therapy:    #1. Screening mammogram early December 2018.  This showed a possible architectural distortion in the right breast.  She also was noted to have bilateral breast occasions.  Patient had bilateral diagnostic mammograms performed with ultrasound on August 31, 2017.  The diagnostic mammogram,  on the right side revealed a small spiculated mass containing several punctate calcifications.  It lies laterally and goes up to posterior depth.  Measuring 6 mm in greatest dimension.  In the central right breast  there was a 4.5 mm group of rounded punctate calcifications, without associated mass, distortion or linearity or branching.  In the left breast upper outer quadrant a 4.5 mm group of round and punctate calcifications without associated mass, distortion,  linearity or branching.  Patient had a targeted ultrasound performed that showed subtle hypoechoic apparent mass in the 9 o'clock position of the right breast, 5 cm from the nipple measuring 6 x 5 x 5.5 mm.  Sonographically evaluation of the right axilla  showed no enlarged or abnormal lymph nodes.    #2.09/14/2017 patient had needle core biopsies performed on the left breast upper outer quadrant this showed fibroadenomatoid nodule with calcifications no malignancy.  Right breast needle core biopsy upper central X clip fibrocystic changes and sclerosing  adenosis with calcifications.     #3.  Status post lumpectomy performed by Dr. Donnice Bury at [facility] surgery.  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    #4.  Status post radiation therapy administered by Dr. Glade Gala from 11/15/2017 through 12/06/2017.    #5.  Begin anastrozole 1 mg daily 01/09/2018   Current Therapy: Anastrozole 1 mg daily  Interim Note: Victoria Pacheco returns today for follow up of breast cancer.  She is tolerating anastrozole.  No side effects are reported.  She does get some aches and pains but she does have osteoarthritis.  Allergies: Venom-honey bee, Atorvastatin , Coconut, Diltiazem , Metoprolol  succinate, Omeprazole, Pantoprazole, Sulfamethoxazole-trimethoprim, Adhesive, and Bisoprolol  Medications: Current Outpatient Medications  Medication Sig Dispense Refill  . acetaminophen   (TYLENOL ) 650 mg ER tablet Take 1 tablet by mouth daily as needed.    SABRA anastrozole (ARIMIDEX) 1 mg tablet TAKE 1 TABLET BY MOUTH EVERY DAY 90 tablet 3  . apixaban  (Eliquis ) 5 mg tab Take 5 mg by mouth 2 (two) times a day.    . calcium  carbonate (TUMS) 500 mg (200 mg calcium ) chewable tablet Take 1 tablet by mouth as needed for heartburn.    . cholecalciferol 25 mcg (1,000 unit) cap Take 1 capsule by mouth Once Daily.    . dilTIAZem  (CARDIZEM  CD) 120 mg 24 hr capsule     . famotidine (PEPCID) 20 mg tablet Take 20 mg by mouth Once Daily.    . fluticasone propionate (FLONASE) 50 mcg/spray nasal spray 1 spray as needed for allergies.    . furosemide  (LASIX ) 20 mg tablet     . hydrocortisone 1 % cream Apply  topically 3 (three) times a day as needed (itching).    . levothyroxine  (SYNTHROID ) 100 mcg tablet TAKE 1 TABLET BY MOUTH EVERY DAY    . loratadine -pseudoePHEDrine  (Claritin -D 24 Hour) 10-240 mg per 24 hr tablet Take 1 tablet by mouth as needed (allergies).    . magnesium  oxide 400 mg magnesium  cap Take 400 mg by mouth as needed.    . multivitamin (THERAGRAN) tab tablet Take 1 tablet by mouth daily as  needed.    . sodium chloride  (Ayr Saline) 0.65 % drop nasal drops Administer 1 spray into affected nostril(s) daily as needed.     No current facility-administered medications for this visit.       Past Medical History: Past Medical History:  Diagnosis Date  . Afib    (CMD)   . Asthma (CMD)    Allergy  to pet Cockatiel bird  . BRCA1 negative   . BRCA2 negative   . Breast cancer    (CMD) 2019   right  . Breast cyst   . Cataract 03-25-23   [facility]  . Chronic kidney disease   . Coronary artery disease 01/04/2020   AFIB  . Disease of thyroid  gland July 1989   Radioactive Iodine thyroid  scan  . Fibroid   . GERD (gastroesophageal reflux disease)   . Hiatal hernia   . Kidney stone 1977?  SABRA Migraine   . Personal history of irradiation   . Urinary tract infection   .  Varicella    as a child    Past Surgical History: Past Surgical History:  Procedure Laterality Date  . BREAST BIOPSY Left 2019   Procedure: BREAST BIOPSY; needle-beinign  . BREAST CYST ASPIRATION  07/13/1991  . BREAST LUMPECTOMY Right 10/10/2017   Procedure: BREAST LUMPECTOMY; breast cancer  . BREAST SURGERY     Procedure: BREAST SURGERY; lump bx  . COLONOSCOPY  07-16-2013   [facility]  . COLPOSCOPY  07/16/2013  . TONSILLECTOMY AND ADENOIDECTOMY     Procedure: TONSILLECTOMY AND ADENOIDECTOMY  . UPPER GASTROINTESTINAL ENDOSCOPY  06-14-2019   [facility]  . WISDOM TOOTH EXTRACTION     Procedure: WISDOM TOOTH EXTRACTION     Personal and Social History: Social History   Socioeconomic History  . Marital status: Married  Tobacco Use  . Smoking status: Never  . Smokeless tobacco: Never  Substance and Sexual Activity  . Alcohol  use: Not Currently  . Drug use: Never  . Sexual activity: Yes    Partners: Male    Birth control/protection: Post-menopausal   Social Drivers of Health   Safety: Low Risk  (05/14/2024)   Safety   . How often does anyone, including family and friends, physically hurt you?: Never   . How often does anyone, including family and friends, insult or talk down to you?: Never   . How often does anyone, including family and friends, threaten you with harm?: Never   . How often does anyone, including family and friends, scream or curse at you?: Never    Family History: Cancer-related family history includes Breast cancer in her mother; Cancer in her mother. There is no history of Ovarian cancer. She indicated that the status of her mother is unknown. She indicated that the status of her father is unknown. She indicated that the status of her paternal grandmother is unknown. She indicated that the status of her paternal grandfather is unknown. She indicated that the status of her neg hx is unknown.   Results for orders placed or  performed in visit on 05/14/24  Comprehensive Metabolic Panel   Collection Time: 05/14/24  1:29 PM  Result Value Ref Range   Sodium 140 136 - 145 mmol/L   Potassium 4.4 3.4 - 4.5 mmol/L   Chloride 107 98 - 107 mmol/L   CO2 29 21 - 31 mmol/L   Anion Gap 4 (L) 6 - 14 mmol/L   Glucose, Random 106 (H) 70 - 99 mg/dL   Blood  Urea Nitrogen (BUN) 13 7 - 25 mg/dL   Creatinine 8.92 9.39 - 1.20 mg/dL   eGFR 55 (L) >40 fO/fpw/8.26f7   Albumin 4.2 3.5 - 5.7 g/dL   Total Protein 6.7 6.4 - 8.9 g/dL   Bilirubin, Total 0.5 0.3 - 1.0 mg/dL   Alkaline Phosphatase (ALP) 82 34 - 104 U/L   Aspartate Aminotransferase (AST) 17 13 - 39 U/L   Alanine Aminotransferase (ALT) 20 7 - 52 U/L   Calcium  9.5 8.6 - 10.3 mg/dL   BUN/Creatinine Ratio    CBC with Differential   Collection Time: 05/14/24  1:29 PM  Result Value Ref Range   WBC 6.09 4.40 - 11.00 10*3/uL   RBC 4.02 (L) 4.10 - 5.10 10*6/uL   Hemoglobin 12.3 12.3 - 15.3 g/dL   Hematocrit 63.4 64.0 - 44.6 %   Mean Corpuscular Volume (MCV) 90.9 80.0 - 96.0 fL   Mean Corpuscular Hemoglobin (MCH) 30.6 27.5 - 33.2 pg   Mean Corpuscular Hemoglobin Conc (MCHC) 33.6 33.0 - 37.0 g/dL   Red Cell Distribution Width (RDW) 13.6 12.3 - 17.0 %   Platelet Count (PLT) 181 150 - 450 10*3/uL   Mean Platelet Volume (MPV) 8.5 6.8 - 10.2 fL   Neutrophils % 68 %   Lymphocytes % 21 %   Monocytes % 9 %   Eosinophils % 2 %   Basophils % 1 %   Neutrophils Absolute 4.10 1.80 - 7.80 10*3/uL   Lymphocytes # 1.30 1.00 - 4.80 10*3/uL   Monocytes # 0.60 0.00 - 0.80 10*3/uL   Eosinophils # 0.10 0.00 - 0.50 10*3/uL   Basophils # 0.10 0.00 - 0.20 10*3/uL    Physical Examination: Vital Signs: There were no vitals taken for this visit. General:  Healthy-appearing female in no acute distress. HEENT: Normocephalic, atraumatic. PERRLA, EOMI, sclera anicteric.  Nose without discharge.  Mouth and lips showed no lesions; oral mucosa is moist.  Neck supple without  masses. Lymphatic/Immunologic:  No cervical, axillary, or femoral adenopathy.  Cardiovascular:  RRR without significant murmurs, gallops, or rubs.  Heart not clinically enlarged.  No lower extremity edema.   Respiratory:  Chest clear to percussion and auscultation bilaterally; no respiratory distress. Patient speaks in complete sentences. Gastrointestinal:  Abdomen soft and without tenderness; no hepatosplenomegaly or other masses. No clinical ascites. Musculoskeletal:  No bony pain or tenderness. No tenosynovitis or joint effusions noted. Extremities:  No edema or suspicious rashes.  No cyanosis or clubbing. Skin:  No pathologic appearing petechiae or bruising noted. Neurologic: Alert and oriented to person, place, time and circumstance.  Strength and sensation are grossly intact.  Cranial nerves III through XII grossly intact.  Psychiatric:    Mood and affect are normal. Speech is fluent.  Assessment and plan:73 y.o. female with    1.Stage I (T1N0) Invasive Ductal Carcinoma of Right Breast: She had a screening mammogram performed in December 2018.  There was a 6 mm mass in the right breast.  Biopsy performed in [place] showed grade 1 invasive ductal carcinoma.  Prognostic panel  showed tumor to be estrogen receptor positive progesterone receptor positive and HER-2/neu negative.  Status post lumpectomy with sentinel lymph node biopsy on 10/10/2017 with the final pathology revealing 0.9 cm DCIS with calcifications, invasive tumor  measured 0.3 cm grade 1, ER +95% PR +50% HER-2/neu negative with K-67 5%.  Status post radiation therapy completed December 06, 2017.  Remains on anastrozole since May 2019.  Tolerating anastrozole quite well.  Up-to-date on her  mammograms.  Labs are reviewed  exam revealed no evidence of recurrent disease.  Continue present therapy    2.  Genetic testing: Status post genetic testing per patient's request.    3. We discussed that endocrine therapy with an aromatase inhibitor  would be most reasonable given that she is menopausal. Utilizing endocrine therapy for at least 5 years would be anticipated to reduce the relative risk of recurrence by ~50%.We discussed  that side effects of aromatase inhibitor therapy could include, but would not be limited to, hot flashes, joint pains and bone loss. Should hot flashes or joint pains become problematic, we could certainly consider changing to a different AI or even tamoxifen.  Therefore, she was encouraged to be in contact with us  regarding her symptoms. With regards to bone loss, it is important to have routine bone  density testing.    4.  Bone health: Patient is on calcium  and vitamin D .  Tolerating it well.  Declines bisphosphonate or Prolia.    5.  Cardiac disease: Patient has atrial fibrillation.  Continues to be followed by cardiology.   6.  Follow-up: 6 months with labs and provider visit    A total of 30 minutes was spent in patient care today.  Greater than 50% of my time was spent face-to-face and nonface-to-face with the patient, reviewing her medical records, completing a physical examination, going over the results of the radiology reports, laboratory results, treatment of breast cancer, completing treatment plan and answering the patient  questions. Patient is encouraged to call with any questions or concerns if they should arise prior to next scheduled office visit.   This record has been created using Conservation officer, historic buildings.

## 2024-05-16 ENCOUNTER — Ambulatory Visit: Attending: Cardiovascular Disease | Admitting: Physician Assistant

## 2024-05-16 VITALS — BP 132/80 | HR 162 | Ht 64.0 in | Wt 189.0 lb

## 2024-05-16 DIAGNOSIS — I5032 Chronic diastolic (congestive) heart failure: Secondary | ICD-10-CM | POA: Insufficient documentation

## 2024-05-16 DIAGNOSIS — I480 Paroxysmal atrial fibrillation: Secondary | ICD-10-CM | POA: Insufficient documentation

## 2024-05-16 DIAGNOSIS — D6869 Other thrombophilia: Secondary | ICD-10-CM | POA: Diagnosis not present

## 2024-05-16 DIAGNOSIS — I4891 Unspecified atrial fibrillation: Secondary | ICD-10-CM | POA: Diagnosis not present

## 2024-05-16 MED ORDER — DILTIAZEM HCL ER COATED BEADS 240 MG PO CP24: 240.0000 mg | ORAL_CAPSULE | Freq: Every day | ORAL | 3 refills | Status: AC

## 2024-05-16 NOTE — Patient Instructions (Signed)
 Medication Instructions:    START TAKING :  DILTIAZEM   240 MG ONCE A DAY    *If you need a refill on your cardiac medications before your next appointment, please call your pharmacy*   Lab Work:  PLEASE GO DOWN STAIRS  [facility]  FIRST FLOOR   ( GET OFF ELEVATORS WALK TOWARDS WAITING AREA LAB LOCATED BY PHARMACY):  BMET AND CBC TODAY     If you have labs (blood work) drawn today and your tests are completely normal, you will receive your results only by: MyChart Message (if you have MyChart) OR A paper copy in the mail If you have any lab test that is abnormal or we need to change your treatment, we will call you to review the results.   Testing/Procedures: Your physician has requested that you have an echocardiogram. Echocardiography is a painless test that uses sound waves to create images of your heart. It provides your doctor with information about the size and shape of your heart and how well your heart's chambers and valves are working. This procedure takes approximately one hour. There are no restrictions for this procedure. Please do NOT wear cologne, perfume, aftershave, or lotions (deodorant is allowed). Please arrive 15 minutes prior to your appointment time.  Please note: We ask at that you not bring children with you during ultrasound (echo/ vascular) testing. Due to room size and safety concerns, children are not allowed in the ultrasound rooms during exams. Our front office staff cannot provide observation of children in our lobby area while testing is being conducted. An adult accompanying a patient to their appointment will only be allowed in the ultrasound room at the discretion of the ultrasound technician under special circumstances. We apologize for any inconvenience.    Follow-Up: At [facility], you and your health needs are our priority.  As part of our continuing mission to provide you with exceptional heart care, our providers are all part of one team.   This team includes your primary Cardiologist (physician) and Advanced Practice Providers or APPs (Physician Assistants and Nurse Practitioners) who all work together to provide you with the care you need, when you need it.   Your next appointment:  2 -3  week(s)  Provider: DR ALMETTA ( CONTACT  CASSIE HALL/ ANGELINE HAMMER FOR EP SCHEDULING ISSUES )   We recommend signing up for the patient portal called MyChart.  Sign up information is provided on this After Visit Summary.  MyChart is used to connect with patients for Virtual Visits (Telemedicine).  Patients are able to view lab/test results, encounter notes, upcoming appointments, etc.  Non-urgent messages can be sent to your provider as well.   To learn more about what you can do with MyChart, go to [email or web address].   Other Instructions

## 2024-05-17 ENCOUNTER — Ambulatory Visit: Payer: Self-pay | Admitting: Physician Assistant

## 2024-05-17 LAB — BASIC METABOLIC PANEL WITH GFR
BUN/Creatinine Ratio: 11 — AB (ref 12–28)
BUN: 12 mg/dL (ref 8–27)
CO2: 22 mmol/L (ref 20–29)
Calcium: 9.6 mg/dL (ref 8.7–10.3)
Chloride: 105 mmol/L (ref 96–106)
Creatinine, Ser: 1.05 mg/dL — AB (ref 0.57–1.00)
Glucose: 90 mg/dL (ref 70–99)
Potassium: 4.4 mmol/L (ref 3.5–5.2)
Sodium: 141 mmol/L (ref 134–144)
eGFR: 56 mL/min/1.73 — AB (ref >59)

## 2024-05-17 LAB — CBC
Hematocrit: 37.3 % (ref 34.0–46.6)
Hemoglobin: 11.9 g/dL (ref 11.1–15.9)
MCH: 30.4 pg (ref 26.6–33.0)
MCHC: 31.9 g/dL (ref 31.5–35.7)
MCV: 95 fL (ref 79–97)
Platelets: 207 x10E3/uL (ref 150–450)
RBC: 3.92 x10E6/uL (ref 3.77–5.28)
RDW: 12.6 % (ref 11.7–15.4)
WBC: 7 x10E3/uL (ref 3.4–10.8)

## 2024-06-04 ENCOUNTER — Ambulatory Visit: Admitting: Student in an Organized Health Care Education/Training Program

## 2024-06-06 ENCOUNTER — Ambulatory Visit: Attending: Cardiology | Admitting: Student in an Organized Health Care Education/Training Program

## 2024-06-06 ENCOUNTER — Encounter: Payer: Self-pay | Admitting: Student in an Organized Health Care Education/Training Program

## 2024-06-06 ENCOUNTER — Ambulatory Visit (HOSPITAL_BASED_OUTPATIENT_CLINIC_OR_DEPARTMENT_OTHER)

## 2024-06-06 VITALS — BP 124/80 | HR 171 | Ht 64.0 in | Wt 186.0 lb

## 2024-06-06 DIAGNOSIS — I480 Paroxysmal atrial fibrillation: Secondary | ICD-10-CM | POA: Insufficient documentation

## 2024-06-06 MED ORDER — AMIODARONE HCL 200 MG PO TABS: 200.0000 mg | ORAL_TABLET | Freq: Every day | ORAL | 1 refills | Status: AC

## 2024-06-06 NOTE — Progress Notes (Unsigned)
 Cardiology Office Note   Date:  06/07/2024  ID:  Victoria Pacheco, DOB [birth year], MRN [id number] PCP: Nichole Senior, MD  [facility] Providers Cardiologist:  None Electrophysiologist:  Donnice DELENA Primus, MD    History of Present Illness Victoria Pacheco is a 73 y.o. female with persistent AF, HLD, hypothyroidism, stage I breast cancer s/p lumpectomy and radiation, HLD, OSA, obesity, and HFpEF who presents for arrhythmia management.  She was first diagnosed with AF on 01/04/20 and was started on diltiazem  60 mg twice daily and Xarelto for stroke risk reduction.  On 01/08/2020 she was switched to Eliquis .  On 03/05/2021 she was started on flecainide  50 mg twice daily.  She noticed some worsening constipation and fatigue.  Later in 2022 her diltiazem  was uptitrated but she had extreme constipation with this and was switched to Toprol  succinate 25 mg.  In 2023 she tried multiple beta-blockers including bisoprolol 5 mg daily, atenolol  50 mg daily, and was prescribed Nebivolol but never tried this.  Eventually she was placed on pindolol  2.5 mg daily.  In August 2025 she was switched back to diltiazem  ER 120 mg daily and increased to 240 mg daily in September.  She last saw Dr. Fernande on 10/31/2023 for AF management.  She has previously tried flecainide  which she thought was associated with fatigue.  She is on Synthroid  for hypothyroidism and that was uptitrated which she felt like increased her AF episodes.  That was reduced back at her last visit with her PCP which resulted in some improvement with her arrhythmia burden.  She was still having at least once monthly episodes that are lasting for more than 24 hours.  She was offered dofetilide initiation, consideration of catheter ablation versus ongoing observation.  She did not want to be admitted for dofetilide load and was not wanting to proceed with ablation because she was concerned about scar on her heart.  Today she presents back to the office with  her husband.  She is in AF/RVR with rates in the 160-170s.  She is fatigued but otherwise appears stable.  She had echocardiogram scheduled but with her RVR this was postponed.  Her last echocardiogram was in 2021 and at that time she had preserved LV function and normal LA size.  We had a long discussion today regarding different options for management of her arrhythmias.  She has been in persistent atrial fibrillation for the past 2 weeks and often is in RVR despite diltiazem  CD 240 mg daily.  I explained that with her now and persistent AF we should consider cardioversion to get her back in normal rhythm.  We also discussed that cardioversion alone will likely not keep her in normal rhythm and she will need to either consider inpatient admission for Tikosyn load versus other antiarrhythmics.  She has already tried flecainide  in the past and did not have a good experience with this.  She has multiple medication intolerances.  Ultimately I think catheter ablation would be the best treatment for her atrial fibrillation and multiple medication intolerances.  For now she is agreeable to start low-dose amiodarone and understands the risk associated with this including worsening hypothyroidism, hyperthyroidism, acute and chronic lung injury, and ocular injury/corneal deposits.   ROS: fatigue, malaise, palpitations  Studies Reviewed  ECG review 06/06/24: AF/RVR 171, QRS 72, QT/c 256/431 05/16/24: coarse AF/RVR (vs AFL with variable block) 162, QRS 66, QT/c 262/430 04/12/24: NSR 86, QRS 70, QT/c 350/418     TTE Result date: 01/31/20  1.  Left ventricular ejection fraction, by estimation, is 65 to 70%. The  left ventricle has normal function. The left ventricle has no regional  wall motion abnormalities. Left ventricular diastolic parameters were  normal. The average left ventricular  global longitudinal strain is -24.4 %. The global longitudinal strain is  normal.   2. Right ventricular systolic  function is normal. The right ventricular  size is normal. Tricuspid regurgitation signal is inadequate for assessing  PA pressure.   3. The mitral valve is normal in structure. Trivial mitral valve  regurgitation. No evidence of mitral stenosis.   4. The aortic valve is tricuspid. Aortic valve regurgitation is trivial.  No aortic stenosis is present.   5. The inferior vena cava is normal in size with greater than 50%  respiratory variability, suggesting right atrial pressure of 3 mmHg.   Risk Assessment/Calculations  CHA2DS2-VASc Score = 3  This indicates a 3.2% annual risk of stroke. The patient's score is based upon: CHF History: 1 HTN History: 0 Diabetes History: 0 Stroke History: 0 Vascular Disease History: 0 Age Score: 1 Gender Score: 1  Physical Exam VS:  BP 124/80 (BP Location: Left Arm, Patient Position: Sitting, Cuff Size: Normal)   Pulse (!) 171   Ht 5' 4 (1.626 m)   Wt 186 lb (84.4 kg)   SpO2 97%   BMI 31.93 kg/m       Wt Readings from Last 3 Encounters:  06/06/24 186 lb (84.4 kg)  05/16/24 189 lb (85.7 kg)  04/10/24 189 lb (85.7 kg)    GEN: Well nourished, well developed in no acute distress NECK: No JVD; No carotid bruits CARDIAC: irregular rhythm, accelerated rate, no murmurs, rubs, gallops RESPIRATORY:  Clear to auscultation without rales, wheezing or rhonchi  ABDOMEN: Soft, non-tender, non-distended EXTREMITIES:  No edema; No deformity   ASSESSMENT AND PLAN Victoria Pacheco is a 73 y.o. female with persistent AF, HLD, hypothyroidism, stage I breast cancer s/p lumpectomy and radiation, HLD, OSA, obesity, and HFpEF who presents for arrhythmia management.  Persistent AF She currently is now in persistent AF/RVR.  She is not rate controlled on diltiazem  CD 240 mg daily and has multiple medication intolerances.  She declines expedited inpatient cardioversion and/or Tikosyn load.  Plan for cardioversion Friday without TEE (uninterrupted OAC for years).  Starting  low-dose amiodarone 200 mg daily to try to minimize potential side effects.  She understands that the cardioversion is only a temporary solution and may not allow maintenance of NSR without a full amiodarone load.  I would rather try to get her in normal rhythm however with her degree of RVR and if we need to repeat cardioversion in a few weeks then we can arrange for this.  I encouraged her to consider catheter ablation as a means to significantly reduce her AF burden at this point.  We will get repeat echo and 3 weeks after she maintains sinus rhythm to evaluate LVEF and LA dilation.    Informed Consent   Shared Decision Making/Informed Consent{ The risks (stroke, cardiac arrhythmias rarely resulting in the need for a temporary or permanent pacemaker, skin irritation or burns and complications associated with conscious sedation including aspiration, arrhythmia, respiratory failure and death), benefits (restoration of normal sinus rhythm) and alternatives of a direct current cardioversion were explained in detail to Ms. Arpin and she agrees to proceed.      Dispo: DCCV Friday, RTC 1 month, TTE 3 weeks  A total of 65 minutes was spent preparing for the  patient, reviewing history, performing exam, document encounter, coordinating care and counseling the patient. 45 minutes was spent with direct patient care.   Signed, Donnice DELENA Primus, MD

## 2024-06-06 NOTE — Patient Instructions (Signed)
 Medication Instructions:  Your physician has recommended you make the following change in your medication:   ** Begin Amiodarone 200mg  - 1 tablet by mouth daily   *If you need a refill on your cardiac medications before your next appointment, please call your pharmacy*  Lab Work: None ordered.  If you have labs (blood work) drawn today and your tests are completely normal, you will receive your results only by: MyChart Message (if you have MyChart) OR A paper copy in the mail If you have any lab test that is abnormal or we need to change your treatment, we will call you to review the results.  Testing/Procedures: Your physician has recommended that you have a Cardioversion (DCCV). Electrical Cardioversion uses a jolt of electricity to your heart either through paddles or wired patches attached to your chest. This is a controlled, usually prescheduled, procedure. Defibrillation is done under light anesthesia in the hospital, and you usually go home the day of the procedure. This is done to get your heart back into a normal rhythm. You are not awake for the procedure. Please see the instruction sheet given to you today.   Follow-Up: At [facility], you and your health needs are our priority.  As part of our continuing mission to provide you with exceptional heart care, our providers are all part of one team.  This team includes your primary Cardiologist (physician) and Advanced Practice Providers or APPs (Physician Assistants and Nurse Practitioners) who all work together to provide you with the care you need, when you need it.  Your next appointment:   1 month with Dr Almetta  We recommend signing up for the patient portal called MyChart.  Sign up information is provided on this After Visit Summary.  MyChart is used to connect with patients for Virtual Visits (Telemedicine).  Patients are able to view lab/test results, encounter notes, upcoming appointments, etc.  Non-urgent  messages can be sent to your provider as well.   To learn more about what you can do with MyChart, go to [email or web address].   Other Instructions     Dear Victoria Pacheco  You are scheduled for a Cardioversion on Friday, October 3 with Dr. Kriste.  Please arrive at the [facility] (Main Entrance A) at [facility]: [address] [place], [place] [address] at 6:30 AM (This time is 1 hour(s) before your procedure to ensure your preparation).   Free valet parking service is available. You will check in at ADMITTING.   *Please Note: You will receive a call the day before your procedure to confirm the appointment time. That time may have changed from the original time based on the schedule for that day.*   DIET:  Nothing to eat or drink after midnight except a sip of water with medications (see medication instructions below)  MEDICATION INSTRUCTIONS: !!IF ANY NEW MEDICATIONS ARE STARTED AFTER TODAY, PLEASE NOTIFY YOUR PROVIDER AS SOON AS POSSIBLE!!  Continue taking your anticoagulant (blood thinner): Apixaban  (Eliquis ).  You will need to continue this after your procedure until you are told by your provider that it is safe to stop.    ** Do not take your Furosemide  the morning of your procedure  ** You may take your other medications with enough water to get them down safely.  LABS: Labs complete  FYI:  For your safety, and to allow us  to monitor your vital signs accurately during the surgery/procedure we request: If you have artificial nails, gel coating, SNS etc,  please have those removed prior to your surgery/procedure. Not having the nail coverings /polish removed may result in cancellation or delay of your surgery/procedure.  Your support person will be asked to wait in the waiting room during your procedure.  It is OK to have someone drop you off and come back when you are ready to be discharged.  You cannot drive after the procedure and will need someone to drive you  home.  Bring your insurance cards.  *Special Note: Every effort is made to have your procedure done on time. Occasionally there are emergencies that occur at the hospital that may cause delays. Please be patient if a delay does occur.

## 2024-06-06 NOTE — Progress Notes (Unsigned)
  Cardiology Office Note   Date:  06/06/2024  ID:  Victoria Pacheco, DOB 1951-03-30, MRN [id number] PCP: Nichole Senior, MD  [facility] Providers Cardiologist:  None { Click to update primary MD,subspecialty MD or APP then REFRESH:1}    History of Present Illness Victoria Pacheco is a 73 y.o. female ***  ROS: ***  Studies Reviewed      *** Risk Assessment/Calculations {Does this patient have ATRIAL FIBRILLATION?:[phone]} No BP recorded.  {Refresh Note OR Click here to enter BP  :1}***       Physical Exam VS:  There were no vitals taken for this visit.       Wt Readings from Last 3 Encounters:  05/16/24 189 lb (85.7 kg)  04/10/24 189 lb (85.7 kg)  01/03/24 191 lb 12.8 oz (87 kg)    GEN: Well nourished, well developed in no acute distress NECK: No JVD; No carotid bruits CARDIAC: ***RRR, no murmurs, rubs, gallops RESPIRATORY:  Clear to auscultation without rales, wheezing or rhonchi  ABDOMEN: Soft, non-tender, non-distended EXTREMITIES:  No edema; No deformity   ASSESSMENT AND PLAN ***    {Are you ordering a CV Procedure (e.g. stress test, cath, DCCV, TEE, etc)?   Press F2        :[id number]}  Dispo: ***  Signed, Donnice DELENA Primus, MD

## 2024-06-06 NOTE — H&P (View-Only) (Signed)
 Cardiology Office Note   Date:  06/07/2024  ID:  Victoria Pacheco, DOB [birth year], MRN [id number] PCP: Nichole Senior, MD  [facility] Providers Cardiologist:  None Electrophysiologist:  Donnice DELENA Primus, MD    History of Present Illness Victoria Pacheco is a 73 y.o. female with persistent AF, HLD, hypothyroidism, stage I breast cancer s/p lumpectomy and radiation, HLD, OSA, obesity, and HFpEF who presents for arrhythmia management.  She was first diagnosed with AF on 01/04/20 and was started on diltiazem  60 mg twice daily and Xarelto for stroke risk reduction.  On 01/08/2020 she was switched to Eliquis .  On 03/05/2021 she was started on flecainide  50 mg twice daily.  She noticed some worsening constipation and fatigue.  Later in 2022 her diltiazem  was uptitrated but she had extreme constipation with this and was switched to Toprol  succinate 25 mg.  In 2023 she tried multiple beta-blockers including bisoprolol 5 mg daily, atenolol  50 mg daily, and was prescribed Nebivolol but never tried this.  Eventually she was placed on pindolol  2.5 mg daily.  In August 2025 she was switched back to diltiazem  ER 120 mg daily and increased to 240 mg daily in September.  She last saw Dr. Fernande on 10/31/2023 for AF management.  She has previously tried flecainide  which she thought was associated with fatigue.  She is on Synthroid  for hypothyroidism and that was uptitrated which she felt like increased her AF episodes.  That was reduced back at her last visit with her PCP which resulted in some improvement with her arrhythmia burden.  She was still having at least once monthly episodes that are lasting for more than 24 hours.  She was offered dofetilide initiation, consideration of catheter ablation versus ongoing observation.  She did not want to be admitted for dofetilide load and was not wanting to proceed with ablation because she was concerned about scar on her heart.  Today she presents back to the office with  her husband.  She is in AF/RVR with rates in the 160-170s.  She is fatigued but otherwise appears stable.  She had echocardiogram scheduled but with her RVR this was postponed.  Her last echocardiogram was in 2021 and at that time she had preserved LV function and normal LA size.  We had a long discussion today regarding different options for management of her arrhythmias.  She has been in persistent atrial fibrillation for the past 2 weeks and often is in RVR despite diltiazem  CD 240 mg daily.  I explained that with her now and persistent AF we should consider cardioversion to get her back in normal rhythm.  We also discussed that cardioversion alone will likely not keep her in normal rhythm and she will need to either consider inpatient admission for Tikosyn load versus other antiarrhythmics.  She has already tried flecainide  in the past and did not have a good experience with this.  She has multiple medication intolerances.  Ultimately I think catheter ablation would be the best treatment for her atrial fibrillation and multiple medication intolerances.  For now she is agreeable to start low-dose amiodarone and understands the risk associated with this including worsening hypothyroidism, hyperthyroidism, acute and chronic lung injury, and ocular injury/corneal deposits.   ROS: fatigue, malaise, palpitations  Studies Reviewed  ECG review 06/06/24: AF/RVR 171, QRS 72, QT/c 256/431 05/16/24: coarse AF/RVR (vs AFL with variable block) 162, QRS 66, QT/c 262/430 04/12/24: NSR 86, QRS 70, QT/c 350/418     TTE Result date: 01/31/20  1.  Left ventricular ejection fraction, by estimation, is 65 to 70%. The  left ventricle has normal function. The left ventricle has no regional  wall motion abnormalities. Left ventricular diastolic parameters were  normal. The average left ventricular  global longitudinal strain is -24.4 %. The global longitudinal strain is  normal.   2. Right ventricular systolic  function is normal. The right ventricular  size is normal. Tricuspid regurgitation signal is inadequate for assessing  PA pressure.   3. The mitral valve is normal in structure. Trivial mitral valve  regurgitation. No evidence of mitral stenosis.   4. The aortic valve is tricuspid. Aortic valve regurgitation is trivial.  No aortic stenosis is present.   5. The inferior vena cava is normal in size with greater than 50%  respiratory variability, suggesting right atrial pressure of 3 mmHg.   Risk Assessment/Calculations  CHA2DS2-VASc Score = 3  This indicates a 3.2% annual risk of stroke. The patient's score is based upon: CHF History: 1 HTN History: 0 Diabetes History: 0 Stroke History: 0 Vascular Disease History: 0 Age Score: 1 Gender Score: 1  Physical Exam VS:  BP 124/80 (BP Location: Left Arm, Patient Position: Sitting, Cuff Size: Normal)   Pulse (!) 171   Ht 5' 4 (1.626 m)   Wt 186 lb (84.4 kg)   SpO2 97%   BMI 31.93 kg/m       Wt Readings from Last 3 Encounters:  06/06/24 186 lb (84.4 kg)  05/16/24 189 lb (85.7 kg)  04/10/24 189 lb (85.7 kg)    GEN: Well nourished, well developed in no acute distress NECK: No JVD; No carotid bruits CARDIAC: irregular rhythm, accelerated rate, no murmurs, rubs, gallops RESPIRATORY:  Clear to auscultation without rales, wheezing or rhonchi  ABDOMEN: Soft, non-tender, non-distended EXTREMITIES:  No edema; No deformity   ASSESSMENT AND PLAN Victoria Pacheco is a 73 y.o. female with persistent AF, HLD, hypothyroidism, stage I breast cancer s/p lumpectomy and radiation, HLD, OSA, obesity, and HFpEF who presents for arrhythmia management.  Persistent AF She currently is now in persistent AF/RVR.  She is not rate controlled on diltiazem  CD 240 mg daily and has multiple medication intolerances.  She declines expedited inpatient cardioversion and/or Tikosyn load.  Plan for cardioversion Friday without TEE (uninterrupted OAC for years).  Starting  low-dose amiodarone 200 mg daily to try to minimize potential side effects.  She understands that the cardioversion is only a temporary solution and may not allow maintenance of NSR without a full amiodarone load.  I would rather try to get her in normal rhythm however with her degree of RVR and if we need to repeat cardioversion in a few weeks then we can arrange for this.  I encouraged her to consider catheter ablation as a means to significantly reduce her AF burden at this point.  We will get repeat echo and 3 weeks after she maintains sinus rhythm to evaluate LVEF and LA dilation.    Informed Consent   Shared Decision Making/Informed Consent{ The risks (stroke, cardiac arrhythmias rarely resulting in the need for a temporary or permanent pacemaker, skin irritation or burns and complications associated with conscious sedation including aspiration, arrhythmia, respiratory failure and death), benefits (restoration of normal sinus rhythm) and alternatives of a direct current cardioversion were explained in detail to Ms. Arpin and she agrees to proceed.      Dispo: DCCV Friday, RTC 1 month, TTE 3 weeks  A total of 65 minutes was spent preparing for the  patient, reviewing history, performing exam, document encounter, coordinating care and counseling the patient. 45 minutes was spent with direct patient care.   Signed, Donnice DELENA Primus, MD

## 2024-06-07 NOTE — Progress Notes (Signed)
 Spoke to patient and instructed them to come at 0630  and to be NPO after 0000.     Confirmed that patient will have a ride home and someone to stay with them for 24 hours after the procedure.   Medications reviewed.  Confirmed blood thinner.  Confirmed no breaks in taking blood thinner for 3+ weeks prior to procedure.

## 2024-06-08 ENCOUNTER — Other Ambulatory Visit: Payer: Self-pay

## 2024-06-08 ENCOUNTER — Ambulatory Visit (HOSPITAL_BASED_OUTPATIENT_CLINIC_OR_DEPARTMENT_OTHER): Admitting: Anesthesiology

## 2024-06-08 ENCOUNTER — Ambulatory Visit (HOSPITAL_COMMUNITY): Admitting: Anesthesiology

## 2024-06-08 ENCOUNTER — Ambulatory Visit (HOSPITAL_COMMUNITY)
Admission: RE | Admit: 2024-06-08 | Discharge: 2024-06-08 | Disposition: A | Discharge status: Home / Self Care | Attending: Internal Medicine | Admitting: Internal Medicine

## 2024-06-08 ENCOUNTER — Encounter (HOSPITAL_COMMUNITY): Admission: RE | Disposition: A | Payer: Self-pay | Source: Home / Self Care | Attending: Internal Medicine

## 2024-06-08 DIAGNOSIS — Z87891 Personal history of nicotine dependence: Secondary | ICD-10-CM | POA: Diagnosis not present

## 2024-06-08 DIAGNOSIS — E039 Hypothyroidism, unspecified: Secondary | ICD-10-CM

## 2024-06-08 DIAGNOSIS — G4733 Obstructive sleep apnea (adult) (pediatric): Secondary | ICD-10-CM | POA: Diagnosis not present

## 2024-06-08 DIAGNOSIS — Z79890 Hormone replacement therapy (postmenopausal): Secondary | ICD-10-CM | POA: Insufficient documentation

## 2024-06-08 DIAGNOSIS — J45909 Unspecified asthma, uncomplicated: Secondary | ICD-10-CM | POA: Insufficient documentation

## 2024-06-08 DIAGNOSIS — I5030 Unspecified diastolic (congestive) heart failure: Secondary | ICD-10-CM

## 2024-06-08 DIAGNOSIS — K219 Gastro-esophageal reflux disease without esophagitis: Secondary | ICD-10-CM | POA: Diagnosis not present

## 2024-06-08 DIAGNOSIS — Z006 Encounter for examination for normal comparison and control in clinical research program: Secondary | ICD-10-CM

## 2024-06-08 DIAGNOSIS — Z7901 Long term (current) use of anticoagulants: Secondary | ICD-10-CM | POA: Diagnosis not present

## 2024-06-08 DIAGNOSIS — I5032 Chronic diastolic (congestive) heart failure: Secondary | ICD-10-CM | POA: Diagnosis not present

## 2024-06-08 DIAGNOSIS — Z79899 Other long term (current) drug therapy: Secondary | ICD-10-CM | POA: Insufficient documentation

## 2024-06-08 DIAGNOSIS — I4819 Other persistent atrial fibrillation: Secondary | ICD-10-CM | POA: Insufficient documentation

## 2024-06-08 DIAGNOSIS — I4891 Unspecified atrial fibrillation: Secondary | ICD-10-CM | POA: Diagnosis not present

## 2024-06-08 DIAGNOSIS — I480 Paroxysmal atrial fibrillation: Secondary | ICD-10-CM

## 2024-06-08 SURGERY — CARDIOVERSION (CATH LAB)
Anesthesia: General

## 2024-06-08 MED ORDER — PROPOFOL 10 MG/ML IV BOLUS
INTRAVENOUS | Status: DC | PRN
  Administered 2024-06-08: 50 mg via INTRAVENOUS
  Administered 2024-06-08: 20 mg via INTRAVENOUS

## 2024-06-08 MED ORDER — SODIUM CHLORIDE 0.9% FLUSH: 3.0000 mL | INTRAVENOUS | Status: DC | PRN

## 2024-06-08 MED ORDER — SODIUM CHLORIDE 0.9 % IV SOLN: 250.0000 mL | INTRAVENOUS | Status: DC | PRN

## 2024-06-08 MED ORDER — SODIUM CHLORIDE 0.9% FLUSH
INTRAVENOUS | Status: DC | PRN
  Administered 2024-06-08 (×2): 3 mL via INTRAVENOUS

## 2024-06-08 MED ORDER — SODIUM CHLORIDE 0.9% FLUSH
3.0000 mL | Freq: Two times a day (BID) | INTRAVENOUS | Status: DC
Start: 2024-06-08 — End: 2024-06-08

## 2024-06-08 MED ORDER — LIDOCAINE 2% (20 MG/ML) 5 ML SYRINGE
INTRAMUSCULAR | Status: DC | PRN
  Administered 2024-06-08: 80 mg via INTRAVENOUS

## 2024-06-08 SURGICAL SUPPLY — 1 items: PAD DEFIB RADIO PHYSIO CONN (PAD) ×1 IMPLANT

## 2024-06-08 NOTE — Research (Signed)
 [facility] Cardioversion Informed Consent   Subject Name: Victoria Pacheco  Subject met inclusion and exclusion criteria.  The informed consent form, study requirements and expectations were reviewed with the subject and questions and concerns were addressed prior to the signing of the consent form.  The subject verbalized understanding of the trial requirements.  The subject agreed to participate in the [facility] Cardioversion trial and signed the informed consent at 0700 on 03/Oct/2025.  The informed consent was obtained prior to performance of any protocol-specific procedures for the subject.  A copy of the signed informed consent was given to the subject and a copy was placed in the subject's medical record.   Rosaline BIRCH Carlo Guevarra

## 2024-06-08 NOTE — Anesthesia Postprocedure Evaluation (Signed)
 Anesthesia Post Note  Patient: Victoria Pacheco  Procedure(s) Performed: CARDIOVERSION     Patient location during evaluation: Cath Lab Anesthesia Type: General Level of consciousness: awake and alert Pain management: pain level controlled Vital Signs Assessment: post-procedure vital signs reviewed and stable Respiratory status: spontaneous breathing, nonlabored ventilation, respiratory function stable and patient connected to nasal cannula oxygen Cardiovascular status: blood pressure returned to baseline and stable Postop Assessment: no apparent nausea or vomiting Anesthetic complications: no   No notable events documented.  Last Vitals:  Vitals:   06/08/24 0753 06/08/24 0803  BP: (!) 125/95 (!) 111/97  Pulse: 82 84  Resp: 19 20  Temp:    SpO2: 99% 97%    Last Pain:  Vitals:   06/08/24 0803  TempSrc:   PainSc: 0-No pain                 Rome Ade

## 2024-06-08 NOTE — Interval H&P Note (Signed)
 History and Physical Interval Note:  06/08/2024 7:24 AM  Victoria Pacheco  has presented today for surgery, with the diagnosis of AFIB.  The various methods of treatment have been discussed with the patient and family. After consideration of risks, benefits and other options for treatment, the patient has consented to  Procedure(s): CARDIOVERSION (N/A) as a surgical intervention.  The patient's history has been reviewed, patient examined, no change in status, stable for surgery.  I have reviewed the patient's chart and labs.  Questions were answered to the patient's satisfaction.     [name]

## 2024-06-08 NOTE — Transfer of Care (Signed)
 Immediate Anesthesia Transfer of Care Note  Patient: Victoria Pacheco  Procedure(s) Performed: CARDIOVERSION  Patient Location: PACU  Anesthesia Type:General  Level of Consciousness: awake, alert , and oriented  Airway & Oxygen Therapy: Patient Spontanous Breathing and Patient connected to nasal cannula oxygen  Post-op Assessment: Report given to RN, Post -op Vital signs reviewed and stable, and Patient moving all extremities X 4  Post vital signs: Reviewed and stable  Last Vitals:  Vitals Value Taken Time  BP 97/68 06/08/24 07:43  Temp 36.4 C 06/08/24 07:43  Pulse 78 06/08/24 07:43  Resp 18 06/08/24 07:43  SpO2 98 % 06/08/24 07:43    Last Pain:  Vitals:   06/08/24 0743  TempSrc: Tympanic  PainSc: Asleep         Complications: No notable events documented.

## 2024-06-08 NOTE — Discharge Instructions (Signed)

## 2024-06-08 NOTE — Anesthesia Preprocedure Evaluation (Addendum)
 Anesthesia Evaluation  Patient identified by MRN, date of birth, ID band Patient awake    Reviewed: Allergy  & Precautions, NPO status , Patient's Chart, lab work & pertinent test results  History of Anesthesia Complications Negative for: history of anesthetic complications  Airway Mallampati: II  TM Distance: >3 FB Neck ROM: Full    Dental no notable dental hx. (+) Teeth Intact   Pulmonary asthma , sleep apnea , neg COPD, Patient abstained from smoking.Not current smoker   Pulmonary exam normal breath sounds clear to auscultation       Cardiovascular Exercise Tolerance: Good METS(-) hypertension(-) CAD and (-) Past MI + dysrhythmias Atrial Fibrillation  Rhythm:Irregular Rate:Tachycardia - Systolic murmurs    TTE Result date: 01/31/20  1. Left ventricular ejection fraction, by estimation, is 65 to 70%. The  left ventricle has normal function. The left ventricle has no regional  wall motion abnormalities. Left ventricular diastolic parameters were  normal. The average left ventricular  global longitudinal strain is -24.4 %. The global longitudinal strain is  normal.   2. Right ventricular systolic function is normal. The right ventricular  size is normal. Tricuspid regurgitation signal is inadequate for assessing  PA pressure.   3. The mitral valve is normal in structure. Trivial mitral valve  regurgitation. No evidence of mitral stenosis.   4. The aortic valve is tricuspid. Aortic valve regurgitation is trivial.  No aortic stenosis is present.   5. The inferior vena cava is normal in size with greater than 50%  respiratory variability, suggesting right atrial pressure of 3 mmHg.     Neuro/Psych  Headaches  negative psych ROS   GI/Hepatic ,GERD  Medicated and Controlled,,(+)     (-) substance abuse    Endo/Other  neg diabetes    Renal/GU negative Renal ROS     Musculoskeletal   Abdominal   Peds  Hematology    Anesthesia Other Findings Past Medical History: No date: Allergic rhinitis No date: Asthma     Comment:  related to allergy  to cockateil (bird)  No date: Cancer [facility])     Comment:  Breast- stage one , stereotactic core biopsy - 08/2017 &              again in 09/2017 No date: Diffuse cystic mastopathy No date: Family history of breast cancer No date: GERD (gastroesophageal reflux disease) No date: Headache     Comment:  hx of cluster headache , better since post menopausal No date: History of kidney stones     Comment:  passed spontaneously - fr. a dietary supplement  No date: Hx of colonic polyps No date: Hypercholesteremia 1989: Hypothyroidism     Comment:  thyroid  scan done with radio iodine  No date: Left knee injury No date: Multiple lipomas No date: Osteopenia No date: Pneumonia     Comment:  not hosp. - treated with antibiotics ( several courses)  No date: Unspecified menopausal and postmenopausal disorder No date: Vitamin D  deficiency  Reproductive/Obstetrics                              Anesthesia Physical Anesthesia Plan  ASA: 3  Anesthesia Plan: General   Post-op Pain Management: Minimal or no pain anticipated   Induction: Intravenous  PONV Risk Score and Plan: 3 and Propofol  infusion, TIVA and Ondansetron   Airway Management Planned: Nasal Cannula  Additional Equipment: None  Intra-op Plan:   Post-operative Plan:   Informed  Consent: I have reviewed the patients History and Physical, chart, labs and discussed the procedure including the risks, benefits and alternatives for the proposed anesthesia with the patient or authorized representative who has indicated his/her understanding and acceptance.     Dental advisory given  Plan Discussed with: CRNA and Surgeon  Anesthesia Plan Comments: (Discussed risks of anesthesia with patient, including possibility of difficulty with spontaneous ventilation under anesthesia necessitating  airway intervention, PONV, and rare risks such as cardiac or respiratory or neurological events, and allergic reactions. Discussed the role of CRNA in patient's perioperative care. Patient understands.)        Anesthesia Quick Evaluation

## 2024-06-08 NOTE — CV Procedure (Signed)
 Procedure: Electrical Cardioversion Indications:  Atrial Fibrillation  Procedure Details:  Consent: Risks of procedure as well as the alternatives and risks of each were explained to the (patient/caregiver).  Consent for procedure obtained.  Time Out: Verified patient identification, verified procedure, site/side was marked, verified correct patient position, special equipment/implants available, medications/allergies/relevent history reviewed, required imaging and test results available.  Performed  Patient placed on cardiac monitor, pulse oximetry, supplemental oxygen as necessary.  Sedation given: by Dr. Boone Pacer pads placed anterior and posterior chest.  Cardioverted 1 time(s).  Cardioversion with synchronized biphasic 200J shock.  Evaluation: Findings: Post procedure EKG shows: NSR Complications: None Patient did tolerate procedure well.  Time Spent Directly with the Patient:  30 minutes   [name] 06/08/2024, 7:39 AM

## 2024-06-10 ENCOUNTER — Encounter (HOSPITAL_COMMUNITY): Payer: Self-pay | Admitting: Internal Medicine

## 2024-06-12 ENCOUNTER — Telehealth: Payer: Self-pay | Admitting: Student in an Organized Health Care Education/Training Program

## 2024-06-12 NOTE — Telephone Encounter (Signed)
 Called her to check in post cardioversion. She is maintaining NSR. HR 70s at rest and 100-120 with exertion. She feels better now in NSR. She has TTE scheduled 10/24 and I see her back on 11/05. Can see what her arrhythmia burden is like then and decide on next steps.   Victoria DELENA Primus, MD [facility]  Cardiac Electrophysiology

## 2024-06-19 ENCOUNTER — Other Ambulatory Visit (HOSPITAL_COMMUNITY)

## 2024-06-23 DIAGNOSIS — Z23 Encounter for immunization: Secondary | ICD-10-CM | POA: Diagnosis not present

## 2024-06-29 ENCOUNTER — Ambulatory Visit (INDEPENDENT_AMBULATORY_CARE_PROVIDER_SITE_OTHER)

## 2024-06-29 DIAGNOSIS — I480 Paroxysmal atrial fibrillation: Secondary | ICD-10-CM | POA: Diagnosis not present

## 2024-07-02 LAB — ECHOCARDIOGRAM COMPLETE
Area-P 1/2: 3.91 cm2
S' Lateral: 2.75 cm

## 2024-07-08 NOTE — Progress Notes (Signed)
 Cardiology Office Note   Date: 07/11/24 ID:  Victoria Pacheco, DOB 06-21-1951, MRN [id number] PCP: Nichole Senior, MD  [facility] Providers Cardiologist:  None Electrophysiologist:  Donnice DELENA Primus, MD   History of Present Illness Victoria Pacheco is a 73 y.o. female with persistent AF, HLD, hypothyroidism, stage I breast cancer s/p lumpectomy and radiation, HLD, OSA, obesity, and HFpEF who presents for arrhythmia management.  I last saw Victoria Pacheco on 06/06/2024 and she was in AF/RVR in the 160-170s.  We had a long discussion about different options for management of her arrhythmias and with multiple medication intolerances I had recommended she consider catheter ablation as a way to reduce her overall AF burden with persistent AF.  She had successful cardioversion on 06/08/2024. I started her on amio 200 mg daily at that visit to try and reduce her AF burden so that she had more time to consider her options for AF management before returning with AF/RVR.  History of Present Illness She feels much better overall with more energy and the ability to do more during the day after her recent cardioversion procedure. Initially, she experienced exhaustion. Her heart rate had been elevated at 170 bpm for approximately three weeks prior to the procedure. Post-procedure, her heart rate decreased to 75 bpm but has since increased to around 116-142 bpm.  She is currently on amiodarone 200 mg once daily and diltiazem  240 mg, which she believes helps in controlling her heart rate. She has not missed any doses of her blood thinner, which she takes twice daily. Despite these medications, her heart rate has increased again, and her watch occasionally indicates she is in atrial fibrillation when her heart rate reaches 150 bpm.  She experiences constipation, which she associates with the increased dosage of her heart rhythm medication. She inquires about the safety of taking a stool softener daily to manage this  side effect. She has been on amiodarone for over a month, and her thyroid  function is monitored regularly due to her history of thyroid  issues and current use of levothyroxine .  She has gained 15 pounds over the six years she has been on anastrozole for cancer treatment, which she attributes to the medication rather than dietary habits. She is considering weight loss as a potential method to reduce her atrial fibrillation burden.  ROS: constipation   Studies Reviewed     ECG review 07/11/24: AF/RVR 143, QRS 78, QT/c 252/388  06/08/24: NSR 82, PR 158, QRS 74, QT/c 380/443 06/06/24: AF/RVR 171, QRS 72, QT/c 256/431 05/16/24: coarse AF/RVR (vs AFL with variable block) 162, QRS 66, QT/c 262/430 04/12/24: NSR 86, QRS 70, QT/c 350/418  TTE Result date: 06/29/24  1. Left ventricular ejection fraction, by estimation, is 60 to 65%. The  left ventricle has normal function. The left ventricle has no regional  wall motion abnormalities. Left ventricular diastolic parameters were  normal.   2. Right ventricular systolic function is normal. The right ventricular  size is normal.   3. The mitral valve is normal in structure. No evidence of mitral valve  regurgitation. No evidence of mitral stenosis.   4. The aortic valve is tricuspid. Aortic valve regurgitation is not  visualized. No aortic stenosis is present.   5. The inferior vena cava is normal in size with greater than 50%  respiratory variability, suggesting right atrial pressure of 3 mmHg.   TTE Result date: 01/31/20  1. Left ventricular ejection fraction, by estimation, is 65 to 70%. The  left ventricle  has normal function. The left ventricle has no regional  wall motion abnormalities. Left ventricular diastolic parameters were  normal. The average left ventricular  global longitudinal strain is -24.4 %. The global longitudinal strain is  normal.   2. Right ventricular systolic function is normal. The right ventricular  size is  normal. Tricuspid regurgitation signal is inadequate for assessing  PA pressure.   3. The mitral valve is normal in structure. Trivial mitral valve  regurgitation. No evidence of mitral stenosis.   4. The aortic valve is tricuspid. Aortic valve regurgitation is trivial.  No aortic stenosis is present.   5. The inferior vena cava is normal in size with greater than 50%  respiratory variability, suggesting right atrial pressure of 3 mmHg.   Risk Assessment/Calculations  CHA2DS2-VASc Score = 3  This indicates a 3.2% annual risk of stroke. The patient's score is based upon: CHF History: 1 HTN History: 0 Diabetes History: 0 Stroke History: 0 Vascular Disease History: 0 Age Score: 1 Gender Score: 1  Physical Exam VS:  BP 108/75   Pulse (!) 143   Ht 5' 4 (1.626 m)   Wt 180 lb (81.6 kg)   SpO2 98%   BMI 30.90 kg/m       Wt Readings from Last 3 Encounters:  07/11/24 180 lb (81.6 kg)  06/06/24 186 lb (84.4 kg)  05/16/24 189 lb (85.7 kg)    GEN: Well nourished, well developed in no acute distress CARDIAC: irregular rate, accelerated rhythm no murmurs, rubs, gallops RESPIRATORY:  Clear to auscultation without rales, wheezing or rhonchi  EXTREMITIES:  No edema; No deformity   ASSESSMENT AND PLAN [name] is a 73 y.o. female with persistent AF, HLD, hypothyroidism, stage I breast cancer s/p lumpectomy and radiation, HLD, OSA, obesity, and HFpEF who presents for arrhythmia management.  Persistent AF HFpEF Hypothyroidism  She had restoration of sinus rhythm for a few days after cardioversion however has been in persistent AF again for over a month.  She is poorly rate controlled despite diltiazem  CD 240 mg daily and amiodarone 200 mg daily.  She has been consistently taking Eliquis  for stroke risk reduction without any missed doses.  She does feel better despite still being in AF as her rates are better controlled than they were at her last visit when she was AF/RVR 170s.  Reviewed  her echo with her in clinic from 07/02/2024 with normal LV function and no atrial enlargement.  We discussed different options for management of her arrhythmias including rate versus rhythm control and antiarrhythmic medications versus ablation.  She is hesitant to pursue invasive procedures right now and would like to try medications first.  We can repeat cardioversion now that she has been on amiodarone for over a month and is fully loaded.  I had more urgently cardioverted her last month because her rates were so uncontrolled and and not surprised that she is back in persistent AF now.  If she has immediate recurrence of AF following cardioversion while on amiodarone then will add Toprol -XL for better rate control and reconsider ablation.  She is on multiple medication intolerances especially beta-blockers and has been tried on metoprolol  in the past.  Additionally she is now having more constipation on amiodarone as well as diltiazem  so I am hesitant to further increase her diltiazem  dosing.  Will check thyroid  studies with her preprocedure labs to make sure that her hypothyroidism is not worse now that she is on amiodarone.    Informed Consent  Shared Decision Making/Informed Consent The risks (stroke, cardiac arrhythmias rarely resulting in the need for a temporary or permanent pacemaker, skin irritation or burns and complications associated with conscious sedation including aspiration, arrhythmia, respiratory failure and death), benefits (restoration of normal sinus rhythm) and alternatives of a direct current cardioversion were explained in detail to Victoria Pacheco and she agrees to proceed.   Dispo: D[facility]V 07/12/24, no interruption in [facility] in the past 30 days, reviewed risks above, now on amiodarone for over 4 weeks. Thyroid  studies collect with pre procedure DCCV labs.   A total of 35 minutes was spent preparing for the patient, reviewing history, performing exam, document encounter, coordinating care  and counseling the patient. 25 minutes was spent with direct patient care.   Signed, Donnice DELENA Primus, MD

## 2024-07-08 NOTE — H&P (View-Only) (Signed)
 Cardiology Office Note   Date: 07/11/24 ID:  Victoria Pacheco, DOB 06-21-1951, MRN [id number] PCP: Nichole Senior, MD  [facility] Providers Cardiologist:  None Electrophysiologist:  Donnice DELENA Primus, MD   History of Present Illness Victoria Pacheco is a 73 y.o. female with persistent AF, HLD, hypothyroidism, stage I breast cancer s/p lumpectomy and radiation, HLD, OSA, obesity, and HFpEF who presents for arrhythmia management.  I last saw Ms. Gau on 06/06/2024 and she was in AF/RVR in the 160-170s.  We had a long discussion about different options for management of her arrhythmias and with multiple medication intolerances I had recommended she consider catheter ablation as a way to reduce her overall AF burden with persistent AF.  She had successful cardioversion on 06/08/2024. I started her on amio 200 mg daily at that visit to try and reduce her AF burden so that she had more time to consider her options for AF management before returning with AF/RVR.  History of Present Illness She feels much better overall with more energy and the ability to do more during the day after her recent cardioversion procedure. Initially, she experienced exhaustion. Her heart rate had been elevated at 170 bpm for approximately three weeks prior to the procedure. Post-procedure, her heart rate decreased to 75 bpm but has since increased to around 116-142 bpm.  She is currently on amiodarone 200 mg once daily and diltiazem  240 mg, which she believes helps in controlling her heart rate. She has not missed any doses of her blood thinner, which she takes twice daily. Despite these medications, her heart rate has increased again, and her watch occasionally indicates she is in atrial fibrillation when her heart rate reaches 150 bpm.  She experiences constipation, which she associates with the increased dosage of her heart rhythm medication. She inquires about the safety of taking a stool softener daily to manage this  side effect. She has been on amiodarone for over a month, and her thyroid  function is monitored regularly due to her history of thyroid  issues and current use of levothyroxine .  She has gained 15 pounds over the six years she has been on anastrozole for cancer treatment, which she attributes to the medication rather than dietary habits. She is considering weight loss as a potential method to reduce her atrial fibrillation burden.  ROS: constipation   Studies Reviewed     ECG review 07/11/24: AF/RVR 143, QRS 78, QT/c 252/388  06/08/24: NSR 82, PR 158, QRS 74, QT/c 380/443 06/06/24: AF/RVR 171, QRS 72, QT/c 256/431 05/16/24: coarse AF/RVR (vs AFL with variable block) 162, QRS 66, QT/c 262/430 04/12/24: NSR 86, QRS 70, QT/c 350/418  TTE Result date: 06/29/24  1. Left ventricular ejection fraction, by estimation, is 60 to 65%. The  left ventricle has normal function. The left ventricle has no regional  wall motion abnormalities. Left ventricular diastolic parameters were  normal.   2. Right ventricular systolic function is normal. The right ventricular  size is normal.   3. The mitral valve is normal in structure. No evidence of mitral valve  regurgitation. No evidence of mitral stenosis.   4. The aortic valve is tricuspid. Aortic valve regurgitation is not  visualized. No aortic stenosis is present.   5. The inferior vena cava is normal in size with greater than 50%  respiratory variability, suggesting right atrial pressure of 3 mmHg.   TTE Result date: 01/31/20  1. Left ventricular ejection fraction, by estimation, is 65 to 70%. The  left ventricle  has normal function. The left ventricle has no regional  wall motion abnormalities. Left ventricular diastolic parameters were  normal. The average left ventricular  global longitudinal strain is -24.4 %. The global longitudinal strain is  normal.   2. Right ventricular systolic function is normal. The right ventricular  size is  normal. Tricuspid regurgitation signal is inadequate for assessing  PA pressure.   3. The mitral valve is normal in structure. Trivial mitral valve  regurgitation. No evidence of mitral stenosis.   4. The aortic valve is tricuspid. Aortic valve regurgitation is trivial.  No aortic stenosis is present.   5. The inferior vena cava is normal in size with greater than 50%  respiratory variability, suggesting right atrial pressure of 3 mmHg.   Risk Assessment/Calculations  CHA2DS2-VASc Score = 3  This indicates a 3.2% annual risk of stroke. The patient's score is based upon: CHF History: 1 HTN History: 0 Diabetes History: 0 Stroke History: 0 Vascular Disease History: 0 Age Score: 1 Gender Score: 1  Physical Exam VS:  BP 108/75   Pulse (!) 143   Ht 5' 4 (1.626 m)   Wt 180 lb (81.6 kg)   SpO2 98%   BMI 30.90 kg/m       Wt Readings from Last 3 Encounters:  07/11/24 180 lb (81.6 kg)  06/06/24 186 lb (84.4 kg)  05/16/24 189 lb (85.7 kg)    GEN: Well nourished, well developed in no acute distress CARDIAC: irregular rate, accelerated rhythm no murmurs, rubs, gallops RESPIRATORY:  Clear to auscultation without rales, wheezing or rhonchi  EXTREMITIES:  No edema; No deformity   ASSESSMENT AND PLAN Victoria Pacheco is a 73 y.o. female with persistent AF, HLD, hypothyroidism, stage I breast cancer s/p lumpectomy and radiation, HLD, OSA, obesity, and HFpEF who presents for arrhythmia management.  Persistent AF HFpEF Hypothyroidism  She had restoration of sinus rhythm for a few days after cardioversion however has been in persistent AF again for over a month.  She is poorly rate controlled despite diltiazem  CD 240 mg daily and amiodarone 200 mg daily.  She has been consistently taking Eliquis  for stroke risk reduction without any missed doses.  She does feel better despite still being in AF as her rates are better controlled than they were at her last visit when she was AF/RVR 170s.  Reviewed  her echo with her in clinic from 07/02/2024 with normal LV function and no atrial enlargement.  We discussed different options for management of her arrhythmias including rate versus rhythm control and antiarrhythmic medications versus ablation.  She is hesitant to pursue invasive procedures right now and would like to try medications first.  We can repeat cardioversion now that she has been on amiodarone for over a month and is fully loaded.  I had more urgently cardioverted her last month because her rates were so uncontrolled and and not surprised that she is back in persistent AF now.  If she has immediate recurrence of AF following cardioversion while on amiodarone then will add Toprol -XL for better rate control and reconsider ablation.  She is on multiple medication intolerances especially beta-blockers and has been tried on metoprolol  in the past.  Additionally she is now having more constipation on amiodarone as well as diltiazem  so I am hesitant to further increase her diltiazem  dosing.  Will check thyroid  studies with her preprocedure labs to make sure that her hypothyroidism is not worse now that she is on amiodarone.    Informed Consent  Shared Decision Making/Informed Consent The risks (stroke, cardiac arrhythmias rarely resulting in the need for a temporary or permanent pacemaker, skin irritation or burns and complications associated with conscious sedation including aspiration, arrhythmia, respiratory failure and death), benefits (restoration of normal sinus rhythm) and alternatives of a direct current cardioversion were explained in detail to Ms. Neils and she agrees to proceed.   Dispo: DCCV 07/12/24, no interruption in [facility] in the past 30 days, reviewed risks above, now on amiodarone for over 4 weeks. Thyroid  studies collect with pre procedure DCCV labs.   A total of 35 minutes was spent preparing for the patient, reviewing history, performing exam, document encounter, coordinating care  and counseling the patient. 25 minutes was spent with direct patient care.   Signed, Donnice DELENA Primus, MD

## 2024-07-11 ENCOUNTER — Ambulatory Visit: Attending: Cardiology | Admitting: Student in an Organized Health Care Education/Training Program

## 2024-07-11 ENCOUNTER — Encounter: Payer: Self-pay | Admitting: Student in an Organized Health Care Education/Training Program

## 2024-07-11 VITALS — BP 108/75 | HR 143 | Ht 64.0 in | Wt 180.0 lb

## 2024-07-11 DIAGNOSIS — Z79899 Other long term (current) drug therapy: Secondary | ICD-10-CM | POA: Insufficient documentation

## 2024-07-11 DIAGNOSIS — I480 Paroxysmal atrial fibrillation: Secondary | ICD-10-CM | POA: Diagnosis not present

## 2024-07-11 DIAGNOSIS — Z5181 Encounter for therapeutic drug level monitoring: Secondary | ICD-10-CM | POA: Insufficient documentation

## 2024-07-11 NOTE — Patient Instructions (Signed)
 Medication Instructions:  Your physician recommends that you continue on your current medications as directed. Please refer to the Current Medication list given to you today.  *If you need a refill on your cardiac medications before your next appointment, please call your pharmacy*  Lab Work: None ordered.  If you have labs (blood work) drawn today and your tests are completely normal, you will receive your results only by: MyChart Message (if you have MyChart) OR A paper copy in the mail If you have any lab test that is abnormal or we need to change your treatment, we will call you to review the results.  Testing/Procedures: Your physician has recommended that you have a Cardioversion (DCCV). Electrical Cardioversion uses a jolt of electricity to your heart either through paddles or wired patches attached to your chest. This is a controlled, usually prescheduled, procedure. Defibrillation is done under light anesthesia in the hospital, and you usually go home the day of the procedure. This is done to get your heart back into a normal rhythm. You are not awake for the procedure. Please see the instruction sheet given to you today.   Follow-Up: At [facility], you and your health needs are our priority.  As part of our continuing mission to provide you with exceptional heart care, our providers are all part of one team.  This team includes your primary Cardiologist (physician) and Advanced Practice Providers or APPs (Physician Assistants and Nurse Practitioners) who all work together to provide you with the care you need, when you need it.  Your next appointment:   3 months  We recommend signing up for the patient portal called MyChart.  Sign up information is provided on this After Visit Summary.  MyChart is used to connect with patients for Virtual Visits (Telemedicine).  Patients are able to view lab/test results, encounter notes, upcoming appointments, etc.  Non-urgent messages  can be sent to your provider as well.   To learn more about what you can do with MyChart, go to [email or web address].   Other Instructions     Dear Victoria Pacheco  You are scheduled for a Cardioversion on Thursday, November 6 with Dr. Raford.  Please arrive at the [facility] (Main Entrance A) at [facility]: [address] [place], [place] [address] at 1130am(This time is 1.5  hour(s) before your procedure to ensure your preparation).   Free valet parking service is available. You will check in at ADMITTING.   *Please Note: You will receive a call the day before your procedure to confirm the appointment time. That time may have changed from the original time based on the schedule for that day.*   DIET:  Nothing to eat or drink after midnight except a sip of water with medications (see medication instructions below)  MEDICATION INSTRUCTIONS: !!IF ANY NEW MEDICATIONS ARE STARTED AFTER TODAY, PLEASE NOTIFY YOUR PROVIDER AS SOON AS POSSIBLE!!   Do not take Furosemide  the morning of your procedure  Continue taking your anticoagulant (blood thinner): Apixaban  (Eliquis ).  You will need to continue this after your procedure until you are told by your provider that it is safe to stop.    LABS:  Your labs will be done at the hospital prior to your procedure - you will need to arrive 1 and 1/2 hours prior to your procedure.  FYI:  For your safety, and to allow us  to monitor your vital signs accurately during the surgery/procedure we request: If you have artificial nails, gel coating,  SNS etc, please have those removed prior to your surgery/procedure. Not having the nail coverings /polish removed may result in cancellation or delay of your surgery/procedure.  Your support person will be asked to wait in the waiting room during your procedure.  It is OK to have someone drop you off and come back when you are ready to be discharged.  You cannot drive after the procedure and will need  someone to drive you home.  Bring your insurance cards.  *Special Note: Every effort is made to have your procedure done on time. Occasionally there are emergencies that occur at the hospital that may cause delays. Please be patient if a delay does occur.

## 2024-07-12 ENCOUNTER — Ambulatory Visit (HOSPITAL_COMMUNITY)
Admission: RE | Admit: 2024-07-12 | Discharge: 2024-07-12 | Disposition: A | Discharge status: Home / Self Care | Attending: Cardiovascular Disease | Admitting: Cardiovascular Disease

## 2024-07-12 ENCOUNTER — Encounter (HOSPITAL_COMMUNITY): Admission: RE | Disposition: A | Payer: Self-pay | Source: Home / Self Care | Attending: Cardiovascular Disease

## 2024-07-12 ENCOUNTER — Other Ambulatory Visit: Payer: Self-pay

## 2024-07-12 ENCOUNTER — Ambulatory Visit (HOSPITAL_COMMUNITY): Admitting: Anesthesiology

## 2024-07-12 ENCOUNTER — Encounter (HOSPITAL_COMMUNITY): Payer: Self-pay | Admitting: Cardiovascular Disease

## 2024-07-12 DIAGNOSIS — C50919 Malignant neoplasm of unspecified site of unspecified female breast: Secondary | ICD-10-CM | POA: Diagnosis not present

## 2024-07-12 DIAGNOSIS — E669 Obesity, unspecified: Secondary | ICD-10-CM | POA: Insufficient documentation

## 2024-07-12 DIAGNOSIS — K219 Gastro-esophageal reflux disease without esophagitis: Secondary | ICD-10-CM | POA: Insufficient documentation

## 2024-07-12 DIAGNOSIS — I5030 Unspecified diastolic (congestive) heart failure: Secondary | ICD-10-CM | POA: Diagnosis not present

## 2024-07-12 DIAGNOSIS — Z79899 Other long term (current) drug therapy: Secondary | ICD-10-CM | POA: Diagnosis not present

## 2024-07-12 DIAGNOSIS — Z6830 Body mass index (BMI) 30.0-30.9, adult: Secondary | ICD-10-CM | POA: Diagnosis not present

## 2024-07-12 DIAGNOSIS — E039 Hypothyroidism, unspecified: Secondary | ICD-10-CM | POA: Diagnosis not present

## 2024-07-12 DIAGNOSIS — K5900 Constipation, unspecified: Secondary | ICD-10-CM | POA: Diagnosis not present

## 2024-07-12 DIAGNOSIS — Z7901 Long term (current) use of anticoagulants: Secondary | ICD-10-CM | POA: Diagnosis not present

## 2024-07-12 DIAGNOSIS — I480 Paroxysmal atrial fibrillation: Secondary | ICD-10-CM

## 2024-07-12 DIAGNOSIS — E785 Hyperlipidemia, unspecified: Secondary | ICD-10-CM | POA: Diagnosis not present

## 2024-07-12 DIAGNOSIS — Z923 Personal history of irradiation: Secondary | ICD-10-CM | POA: Diagnosis not present

## 2024-07-12 DIAGNOSIS — I5032 Chronic diastolic (congestive) heart failure: Secondary | ICD-10-CM | POA: Insufficient documentation

## 2024-07-12 DIAGNOSIS — I4819 Other persistent atrial fibrillation: Secondary | ICD-10-CM | POA: Diagnosis not present

## 2024-07-12 DIAGNOSIS — I4891 Unspecified atrial fibrillation: Secondary | ICD-10-CM

## 2024-07-12 DIAGNOSIS — G4733 Obstructive sleep apnea (adult) (pediatric): Secondary | ICD-10-CM | POA: Insufficient documentation

## 2024-07-12 LAB — POCT I-STAT, CHEM 8
BUN: 13 mg/dL (ref 8–23)
Calcium, Ion: 1.23 mmol/L (ref 1.15–1.40)
Chloride: 103 mmol/L (ref 98–111)
Creatinine, Ser: 1.2 mg/dL — ABNORMAL HIGH (ref 0.44–1.00)
Glucose, Bld: 101 mg/dL — ABNORMAL HIGH (ref 70–99)
HCT: 39 % (ref 36.0–46.0)
Hemoglobin: 13.3 g/dL (ref 12.0–15.0)
Potassium: 4 mmol/L (ref 3.5–5.1)
Sodium: 141 mmol/L (ref 135–145)
TCO2: 25 mmol/L (ref 22–32)

## 2024-07-12 SURGERY — CARDIOVERSION (CATH LAB)
Anesthesia: General

## 2024-07-12 MED ORDER — SODIUM CHLORIDE 0.9 % IV SOLN: INTRAVENOUS | Status: DC

## 2024-07-12 MED ORDER — PROPOFOL 10 MG/ML IV BOLUS
INTRAVENOUS | Status: DC | PRN
  Administered 2024-07-12: 50 mg via INTRAVENOUS

## 2024-07-12 MED ORDER — LIDOCAINE 2% (20 MG/ML) 5 ML SYRINGE
INTRAMUSCULAR | Status: DC | PRN
Start: 2024-07-12 — End: 2024-07-12
  Administered 2024-07-12: 100 mg via INTRAVENOUS

## 2024-07-12 SURGICAL SUPPLY — 1 items: PAD DEFIB RADIO PHYSIO CONN (PAD) ×1 IMPLANT

## 2024-07-12 NOTE — Transfer of Care (Signed)
 Immediate Anesthesia Transfer of Care Note  Patient: Victoria Pacheco  Procedure(s) Performed: CARDIOVERSION  Patient Location: Cath Lab  Anesthesia Type:MAC  Level of Consciousness: awake, oriented, and drowsy  Airway & Oxygen Therapy: Patient connected to nasal cannula oxygen  Post-op Assessment: Report given to RN and Post -op Vital signs reviewed and stable  Post vital signs: Reviewed and stable  Last Vitals:  Vitals Value Taken Time  BP 98/66 07/12/24  Temp    Pulse 69 07/12/24  Resp 17 07/12/24  SpO2 99 07/12/24    Last Pain:  Vitals:   07/12/24 1133  TempSrc: Temporal  PainSc: 0-No pain         Complications: No notable events documented.

## 2024-07-12 NOTE — Anesthesia Postprocedure Evaluation (Signed)
 Anesthesia Post Note  Patient: Victoria Pacheco  Procedure(s) Performed: CARDIOVERSION     Patient location during evaluation: PACU Anesthesia Type: General Level of consciousness: awake and alert Pain management: pain level controlled Vital Signs Assessment: post-procedure vital signs reviewed and stable Respiratory status: spontaneous breathing, nonlabored ventilation, respiratory function stable and patient connected to nasal cannula oxygen Cardiovascular status: blood pressure returned to baseline and stable Postop Assessment: no apparent nausea or vomiting Anesthetic complications: no   No notable events documented.  Last Vitals:  Vitals:   07/12/24 1219 07/12/24 1229  BP: 106/65 113/67  Pulse: 70 71  Resp: 19 20  Temp: 36.4 C   SpO2: 98% 95%    Last Pain:  Vitals:   07/12/24 1229  TempSrc:   PainSc: 0-No pain                 Franky JONETTA Bald

## 2024-07-12 NOTE — Anesthesia Preprocedure Evaluation (Addendum)
 Anesthesia Evaluation  Patient identified by MRN, date of birth, ID band Patient awake    Reviewed: Allergy  & Precautions, NPO status , Patient's Chart, lab work & pertinent test results  History of Anesthesia Complications Negative for: history of anesthetic complications  Airway Mallampati: II  TM Distance: <3 FB Neck ROM: Full    Dental  (+) Teeth Intact, Dental Advisory Given   Pulmonary asthma , sleep apnea    breath sounds clear to auscultation       Cardiovascular + dysrhythmias Atrial Fibrillation  Rhythm:Irregular Rate:Abnormal   '25 TTE - wnl    Neuro/Psych  Headaches  negative psych ROS   GI/Hepatic ,GERD  Medicated and Controlled,,  Endo/Other  Hypothyroidism   Obesity   Renal/GU negative Renal ROS     Musculoskeletal   Abdominal   Peds  Hematology  On eliquis     Anesthesia Other Findings   Reproductive/Obstetrics                              Anesthesia Physical Anesthesia Plan  ASA: 3  Anesthesia Plan: General   Post-op Pain Management: Minimal or no pain anticipated   Induction: Intravenous  PONV Risk Score and Plan: 3 and Propofol  infusion and Treatment may vary due to age or medical condition  Airway Management Planned: Natural Airway and Mask  Additional Equipment: None  Intra-op Plan:   Post-operative Plan:   Informed Consent: I have reviewed the patients History and Physical, chart, labs and discussed the procedure including the risks, benefits and alternatives for the proposed anesthesia with the patient or authorized representative who has indicated his/her understanding and acceptance.       Plan Discussed with: CRNA and Anesthesiologist  Anesthesia Plan Comments:          Anesthesia Quick Evaluation

## 2024-07-12 NOTE — CV Procedure (Signed)
 Electrical Cardioversion Procedure Note Victoria Pacheco [id number] 1951-01-21  Procedure: Electrical Cardioversion Indications:  Atrial Fibrillation  Procedure Details Consent: Risks of procedure as well as the alternatives and risks of each were explained to the (patient/caregiver).  Consent for procedure obtained. Time Out: Verified patient identification, verified procedure, site/side was marked, verified correct patient position, special equipment/implants available, medications/allergies/relevent history reviewed, required imaging and test results available.  Performed  Patient placed on cardiac monitor, pulse oximetry, supplemental oxygen as necessary.  Sedation given: propofol  Pacer pads placed anterior and posterior chest.  Cardioverted 1 time(s).  Cardioverted at 200J.  Evaluation Findings: Post procedure EKG shows: NSR Complications: None Patient did tolerate procedure well.   Victoria Scarce, MD 07/12/2024, 12:16 PM

## 2024-07-12 NOTE — Interval H&P Note (Signed)
 History and Physical Interval Note:  07/12/2024 12:06 PM  Victoria Pacheco  has presented today for surgery, with the diagnosis of afib.  The various methods of treatment have been discussed with the patient and family. After consideration of risks, benefits and other options for treatment, the patient has consented to  Procedure(s): CARDIOVERSION (N/A) as a surgical intervention.  The patient's history has been reviewed, patient examined, no change in status, stable for surgery.  I have reviewed the patient's chart and labs.  Questions were answered to the patient's satisfaction.     Annabella Scarce, MD

## 2024-07-12 NOTE — Discharge Instructions (Signed)

## 2024-07-16 ENCOUNTER — Other Ambulatory Visit: Payer: Self-pay | Admitting: Internal Medicine

## 2024-07-20 DIAGNOSIS — D1801 Hemangioma of skin and subcutaneous tissue: Secondary | ICD-10-CM | POA: Diagnosis not present

## 2024-07-20 DIAGNOSIS — D179 Benign lipomatous neoplasm, unspecified: Secondary | ICD-10-CM | POA: Diagnosis not present

## 2024-07-20 DIAGNOSIS — L719 Rosacea, unspecified: Secondary | ICD-10-CM | POA: Diagnosis not present

## 2024-07-20 DIAGNOSIS — L918 Other hypertrophic disorders of the skin: Secondary | ICD-10-CM | POA: Diagnosis not present

## 2024-07-20 DIAGNOSIS — D229 Melanocytic nevi, unspecified: Secondary | ICD-10-CM | POA: Diagnosis not present

## 2024-07-20 DIAGNOSIS — L814 Other melanin hyperpigmentation: Secondary | ICD-10-CM | POA: Diagnosis not present

## 2024-07-20 DIAGNOSIS — L821 Other seborrheic keratosis: Secondary | ICD-10-CM | POA: Diagnosis not present

## 2024-07-20 MED ORDER — FUROSEMIDE 20 MG PO TABS: 20.0000 mg | ORAL_TABLET | Freq: Every day | ORAL | 11 refills | Status: AC | PRN

## 2024-10-26 ENCOUNTER — Ambulatory Visit: Admitting: Student in an Organized Health Care Education/Training Program

## 2024-11-27 ENCOUNTER — Ambulatory Visit: Admitting: Student in an Organized Health Care Education/Training Program
